# Patient Record
Sex: Male | Born: 1960
Health system: Southern US, Community
[De-identification: ages and names within clinical notes are randomized; demographics above are authoritative.]

## PROBLEM LIST (undated history)

## (undated) DIAGNOSIS — Z87442 Personal history of urinary calculi: Secondary | ICD-10-CM

## (undated) DIAGNOSIS — C801 Malignant (primary) neoplasm, unspecified: Secondary | ICD-10-CM

## (undated) DIAGNOSIS — I10 Essential (primary) hypertension: Secondary | ICD-10-CM

## (undated) DIAGNOSIS — K56699 Other intestinal obstruction unspecified as to partial versus complete obstruction: Secondary | ICD-10-CM

## (undated) HISTORY — PX: OTHER SURGICAL HISTORY: SHX169

---

## 1997-11-18 ENCOUNTER — Emergency Department (HOSPITAL_COMMUNITY): Admission: EM | Admit: 1997-11-18 | Discharge: 1997-11-18 | Payer: Self-pay | Admitting: Emergency Medicine

## 1997-11-20 ENCOUNTER — Ambulatory Visit (HOSPITAL_COMMUNITY): Admission: RE | Admit: 1997-11-20 | Discharge: 1997-11-20 | Payer: Self-pay | Admitting: Urology

## 1997-11-22 ENCOUNTER — Observation Stay (HOSPITAL_COMMUNITY): Admission: EM | Admit: 1997-11-22 | Discharge: 1997-11-23 | Payer: Self-pay | Admitting: Emergency Medicine

## 2000-05-23 HISTORY — PX: URETERAL STENT PLACEMENT: SHX822

## 2006-05-31 ENCOUNTER — Emergency Department (HOSPITAL_COMMUNITY): Admission: EM | Admit: 2006-05-31 | Discharge: 2006-05-31 | Payer: Self-pay | Admitting: Emergency Medicine

## 2007-04-02 ENCOUNTER — Emergency Department (HOSPITAL_COMMUNITY): Admission: EM | Admit: 2007-04-02 | Discharge: 2007-04-02 | Payer: Self-pay | Admitting: Emergency Medicine

## 2011-03-01 LAB — I-STAT 8, (EC8 V) (CONVERTED LAB)
Acid-base deficit: 1
BUN: 21
Bicarbonate: 24
Chloride: 110
Glucose, Bld: 110 — ABNORMAL HIGH
HCT: 44
Hemoglobin: 15
Operator id: 294501
Potassium: 4
Sodium: 141
TCO2: 25
pCO2, Ven: 40.1 — ABNORMAL LOW
pH, Ven: 7.386 — ABNORMAL HIGH

## 2011-03-01 LAB — RAPID URINE DRUG SCREEN, HOSP PERFORMED
Barbiturates: NOT DETECTED
Cocaine: NOT DETECTED
Opiates: NOT DETECTED
Tetrahydrocannabinol: NOT DETECTED

## 2011-03-01 LAB — URINALYSIS, ROUTINE W REFLEX MICROSCOPIC
Bilirubin Urine: NEGATIVE
Glucose, UA: NEGATIVE
Hgb urine dipstick: NEGATIVE
Ketones, ur: NEGATIVE
Nitrite: NEGATIVE
Protein, ur: NEGATIVE
Specific Gravity, Urine: 1.023
Urobilinogen, UA: 0.2
pH: 6

## 2011-11-30 ENCOUNTER — Emergency Department: Payer: Self-pay | Admitting: Emergency Medicine

## 2011-11-30 LAB — URINALYSIS, COMPLETE
Bacteria: NONE SEEN
Bilirubin,UR: NEGATIVE
Glucose,UR: NEGATIVE mg/dL (ref 0–75)
Leukocyte Esterase: NEGATIVE
Nitrite: NEGATIVE
Specific Gravity: 1.019 (ref 1.003–1.030)
Squamous Epithelial: NONE SEEN
WBC UR: <1 /HPF (ref 0–5)

## 2011-11-30 LAB — CBC
MCH: 28.8 pg (ref 26.0–34.0)
MCHC: 33 g/dL (ref 32.0–36.0)
Platelet: 251 10*3/uL (ref 150–440)
RDW: 13.5 % (ref 11.5–14.5)

## 2011-11-30 LAB — BASIC METABOLIC PANEL
Calcium, Total: 9.5 mg/dL (ref 8.5–10.1)
Chloride: 106 mmol/L (ref 98–107)
Co2: 29 mmol/L (ref 21–32)
Creatinine: 1.67 mg/dL — ABNORMAL HIGH (ref 0.60–1.30)
Sodium: 142 mmol/L (ref 136–145)

## 2013-02-05 ENCOUNTER — Other Ambulatory Visit: Payer: Self-pay | Admitting: Sports Medicine

## 2013-02-05 DIAGNOSIS — M25511 Pain in right shoulder: Secondary | ICD-10-CM

## 2013-02-07 ENCOUNTER — Ambulatory Visit
Admission: RE | Admit: 2013-02-07 | Discharge: 2013-02-07 | Disposition: A | Discharge status: Home / Self Care | Payer: No Typology Code available for payment source | Source: Ambulatory Visit | Attending: Sports Medicine | Admitting: Sports Medicine

## 2013-02-07 DIAGNOSIS — M25511 Pain in right shoulder: Secondary | ICD-10-CM

## 2013-06-03 ENCOUNTER — Emergency Department: Payer: Self-pay | Admitting: Emergency Medicine

## 2015-02-18 ENCOUNTER — Encounter: Payer: Self-pay | Admitting: Family Medicine

## 2015-02-18 ENCOUNTER — Ambulatory Visit (INDEPENDENT_AMBULATORY_CARE_PROVIDER_SITE_OTHER): Payer: BLUE CROSS/BLUE SHIELD | Admitting: Family Medicine

## 2015-02-18 ENCOUNTER — Encounter (INDEPENDENT_AMBULATORY_CARE_PROVIDER_SITE_OTHER): Payer: Self-pay

## 2015-02-18 VITALS — BP 140/100 | HR 80 | Temp 98.6°F | Ht 73.75 in | Wt 208.8 lb

## 2015-02-18 DIAGNOSIS — Z125 Encounter for screening for malignant neoplasm of prostate: Secondary | ICD-10-CM

## 2015-02-18 DIAGNOSIS — N400 Enlarged prostate without lower urinary tract symptoms: Secondary | ICD-10-CM | POA: Diagnosis not present

## 2015-02-18 DIAGNOSIS — Z13220 Encounter for screening for lipoid disorders: Secondary | ICD-10-CM | POA: Diagnosis not present

## 2015-02-18 DIAGNOSIS — R7989 Other specified abnormal findings of blood chemistry: Secondary | ICD-10-CM

## 2015-02-18 DIAGNOSIS — E663 Overweight: Secondary | ICD-10-CM | POA: Diagnosis not present

## 2015-02-18 DIAGNOSIS — R42 Dizziness and giddiness: Secondary | ICD-10-CM | POA: Diagnosis not present

## 2015-02-18 DIAGNOSIS — R0789 Other chest pain: Secondary | ICD-10-CM | POA: Diagnosis not present

## 2015-02-18 DIAGNOSIS — R079 Chest pain, unspecified: Secondary | ICD-10-CM | POA: Insufficient documentation

## 2015-02-18 LAB — CBC
HEMATOCRIT: 50.3 % (ref 39.0–52.0)
Hemoglobin: 17.1 g/dL — ABNORMAL HIGH (ref 13.0–17.0)
MCHC: 34 g/dL (ref 30.0–36.0)
MCV: 86.4 fl (ref 78.0–100.0)
Platelets: 294 10*3/uL (ref 150.0–400.0)
RBC: 5.82 Mil/uL — ABNORMAL HIGH (ref 4.22–5.81)
RDW: 13.7 % (ref 11.5–15.5)
WBC: 8.6 10*3/uL (ref 4.0–10.5)

## 2015-02-18 LAB — COMPREHENSIVE METABOLIC PANEL
ALK PHOS: 72 U/L (ref 39–117)
ALT: 23 U/L (ref 0–53)
AST: 15 U/L (ref 0–37)
Albumin: 4.5 g/dL (ref 3.5–5.2)
BUN: 18 mg/dL (ref 6–23)
CHLORIDE: 102 meq/L (ref 96–112)
CO2: 30 meq/L (ref 19–32)
Calcium: 10.2 mg/dL (ref 8.4–10.5)
Creatinine, Ser: 1.15 mg/dL (ref 0.40–1.50)
GFR: 70.38 mL/min (ref >60.00)
GLUCOSE: 89 mg/dL (ref 70–99)
POTASSIUM: 4.6 meq/L (ref 3.5–5.1)
SODIUM: 140 meq/L (ref 135–145)
Total Bilirubin: 0.7 mg/dL (ref 0.2–1.2)
Total Protein: 7.1 g/dL (ref 6.0–8.3)

## 2015-02-18 LAB — LIPID PANEL
CHOL/HDL RATIO: 8
Cholesterol: 253 mg/dL — ABNORMAL HIGH (ref 0–200)
HDL: 33.3 mg/dL — AB (ref >39.00)
Triglycerides: 422 mg/dL — ABNORMAL HIGH (ref 0.0–149.0)

## 2015-02-18 LAB — PSA: PSA: 2.91 ng/mL (ref 0.10–4.00)

## 2015-02-18 LAB — LDL CHOLESTEROL, DIRECT: LDL DIRECT: 157 mg/dL

## 2015-02-18 LAB — HEMOGLOBIN A1C: HEMOGLOBIN A1C: 5.6 % (ref 4.6–6.5)

## 2015-02-18 MED ORDER — TAMSULOSIN HCL 0.4 MG PO CAPS: 0.4000 mg | ORAL_CAPSULE | Freq: Every day | ORAL | Status: DC

## 2015-02-18 MED ORDER — MECLIZINE HCL 25 MG PO TABS: 25.0000 mg | ORAL_TABLET | Freq: Three times a day (TID) | ORAL | Status: DC | PRN

## 2015-02-18 MED ORDER — ASPIRIN EC 81 MG PO TBEC: 81.0000 mg | DELAYED_RELEASE_TABLET | Freq: Every day | ORAL | Status: AC

## 2015-02-18 NOTE — Assessment & Plan Note (Addendum)
Patients history suggestive of vertigo. He has had this in the past as well. Offered PT for neurovestibular rehab. He declined. Treating with PRN Meclizine.

## 2015-02-18 NOTE — Progress Notes (Signed)
Pre visit review using our clinic review tool, if applicable. No additional management support is needed unless otherwise documented below in the visit note. 

## 2015-02-18 NOTE — Patient Instructions (Signed)
Take the Flomax and Aspirin (81 mg) daily.  We will call with your cardiology appointment.  Please follow up with me after your appointment (in the next few weeks to 1 month).  We will call with your lab results.  Take care  Dr. Lacinda Axon

## 2015-02-18 NOTE — Progress Notes (Addendum)
Subjective:  Patient ID: Anthony Knight, male    DOB: 07-23-1960  Age: 54 y.o. MRN: [id number]  CC: Establish care; Chest pain, Dizziness.  HPI Anthony Knight is a 54 y.o. male presents to the clinic today to establish care.  He has a few issues/concerns to discuss today. See below.  1) Dizziness  He reports he had dizziness and nausea and vomiting approximately 3 weeks ago which resolved quickly.  Patient had an additional bout of dizziness on Saturday.  He describes the dizziness as feeling as if things are spinning. He also states he felt like he may pass out.  He reports associated diaphoresis and nausea and vomiting.  He reports that when it happened on Saturday he took his blood pressure was 175/111.  He reports some associated visual difficulty but he has not had an ophthalmologic evaluation and approximately year.  He reports that it improved slowly and spontaneously.  Dizziness was exacerbated by movement. Relieved by being still.  2) Chest pain  Patient reports that after his episode of dizziness and diaphoresis he subsequently developed midsternal chest pain.  He reports the pain is mild to moderate and constant.  He states that he continues to have pain.  He reports that the pain is worse with lying down. Relieved by standing up/leaning forward.  He denies any association with exertion.  Additionally, he states that he's had marked fatigue for approximately 1 month.  PMH, Surgical Hx, Family Hx, Social History reviewed and updated as below. Past Medical History  Diagnosis Date  . Chicken pox   . Kidney stones    Past Surgical History  Procedure Laterality Date  . Ureteral stent placement  2002    kidney stones removal as well  . Rectal tear      Per patient report   Family History  Problem Relation Age of Onset  . Cancer Father     prostate  . Cancer Maternal Grandfather     lung and colon  . Cancer Maternal Uncle     colon  . Cancer Maternal  Uncle     spleen  . Cancer Maternal Uncle    Social History  Substance Use Topics  . Smoking status: Former Smoker    Quit date: 02/17/1985  . Smokeless tobacco: Never Used  . Alcohol Use: 0.0 - 0.6 oz/week    0-1 Standard drinks or equivalent per week   Review of Systems  Constitutional: Positive for diaphoresis.  HENT: Positive for tinnitus.   Cardiovascular: Positive for chest pain and leg swelling.  Gastrointestinal: Positive for nausea and vomiting.  Genitourinary: Positive for difficulty urinating.  Musculoskeletal: Positive for myalgias.  All other systems negative.  Objective:   Today's Vitals: BP 140/100 mmHg  Pulse 80  Temp(Src) 98.6 F (37 C) (Oral)  Ht 6' 1.75" (1.873 m)  Wt 208 lb 12 oz (94.688 kg)  BMI 26.99 kg/m2  SpO2 97%  Physical Exam  Constitutional: He is oriented to person, place, and time. He appears well-developed and well-nourished. No distress.  HENT:  Head: Normocephalic and atraumatic.  Nose: Nose normal.  Mouth/Throat: Oropharynx is clear and moist. No oropharyngeal exudate.  Normal TM's bilaterally.   Eyes: Conjunctivae are normal. No scleral icterus.  Neck: Neck supple. No thyromegaly present.  Cardiovascular: Normal rate and regular rhythm.   No murmur heard. Pulmonary/Chest: Effort normal and breath sounds normal. He has no wheezes. He has no rales. He exhibits tenderness.    Abdominal: Soft. He exhibits  no distension. There is no tenderness. There is no rebound and no guarding.  Genitourinary: Prostate is enlarged.  Musculoskeletal: Normal range of motion. He exhibits no edema.  Lymphadenopathy:    He has no cervical adenopathy.  Neurological: He is alert and oriented to person, place, and time.  Skin: Skin is warm and dry. No rash noted.  Psychiatric: He has a normal mood and affect.  Vitals reviewed.  EKG: normal EKG, normal sinus rhythm, no signs of ischemia.  Assessment & Plan:   Problem List Items Addressed This Visit      Dizziness    Patients history suggestive of vertigo. He has had this in the past as well. Offered PT for neurovestibular rehab. He declined. Treating with PRN Meclizine.      Relevant Orders   CBC   Chest pain - Primary    Obtaining labs today. EKG was unremarkable. Given age and recent diaphoresis and chest pain, I recommended cardiology evaluation (and likely stress). Patient in agreement. Referring to cardiology. ASA daily.       Relevant Orders   EKG 12-Lead (Completed)   Ambulatory referral to Cardiology   BPH (benign prostatic hyperplasia)    Review of systems revealed some difficulty urinating. Patient reported that he had difficulty initiating and keeping a stream of urine. Rectal exam revealed enlargement of the prostate consistent with BPH. Treated with Flomax.      Relevant Medications   tamsulosin (FLOMAX) 0.4 MG CAPS capsule    Other Visit Diagnoses    Overweight (BMI 25.0-29.9)        Relevant Orders    Hemoglobin A1c    Comprehensive metabolic panel    Screening for lipid disorders        Relevant Orders    Lipid panel    Screening for prostate cancer        Relevant Orders    PSA       Outpatient Encounter Prescriptions as of 02/18/2015  Medication Sig  . aspirin EC 81 MG tablet Take 1 tablet (81 mg total) by mouth daily.  . meclizine (ANTIVERT) 25 MG tablet Take 1 tablet (25 mg total) by mouth 3 (three) times daily as needed for dizziness.  . tamsulosin (FLOMAX) 0.4 MG CAPS capsule Take 1 capsule (0.4 mg total) by mouth daily.   No facility-administered encounter medications on file as of 02/18/2015.    Follow-up: Following cardiology eval; within the next few weeks.   Coral Spikes DO

## 2015-02-18 NOTE — Assessment & Plan Note (Signed)
Review of systems revealed some difficulty urinating. Patient reported that he had difficulty initiating and keeping a stream of urine. Rectal exam revealed enlargement of the prostate consistent with BPH. Treated with Flomax.

## 2015-02-18 NOTE — Assessment & Plan Note (Signed)
Obtaining labs today. EKG was unremarkable. Given age and recent diaphoresis and chest pain, I recommended cardiology evaluation (and likely stress). Patient in agreement. Referring to cardiology. ASA daily.

## 2015-02-27 ENCOUNTER — Other Ambulatory Visit (INDEPENDENT_AMBULATORY_CARE_PROVIDER_SITE_OTHER): Payer: BLUE CROSS/BLUE SHIELD

## 2015-02-27 ENCOUNTER — Other Ambulatory Visit: Payer: Self-pay | Admitting: Family Medicine

## 2015-02-27 ENCOUNTER — Telehealth: Payer: Self-pay | Admitting: *Deleted

## 2015-02-27 DIAGNOSIS — E785 Hyperlipidemia, unspecified: Secondary | ICD-10-CM

## 2015-02-27 LAB — LIPID PANEL
CHOL/HDL RATIO: 8
Cholesterol: 234 mg/dL — ABNORMAL HIGH (ref 0–200)
HDL: 31 mg/dL — ABNORMAL LOW (ref >39.00)
NonHDL: 202.64
Triglycerides: 259 mg/dL — ABNORMAL HIGH (ref 0.0–149.0)
VLDL: 51.8 mg/dL — AB (ref 0.0–40.0)

## 2015-02-27 LAB — LDL CHOLESTEROL, DIRECT: LDL DIRECT: 138 mg/dL

## 2015-02-27 NOTE — Telephone Encounter (Signed)
Labs and dx?  

## 2015-03-02 ENCOUNTER — Other Ambulatory Visit: Payer: Self-pay | Admitting: Family Medicine

## 2015-03-02 MED ORDER — ATORVASTATIN CALCIUM 40 MG PO TABS: 40.0000 mg | ORAL_TABLET | Freq: Every day | ORAL | Status: DC

## 2015-03-26 ENCOUNTER — Ambulatory Visit: Payer: BLUE CROSS/BLUE SHIELD | Admitting: Internal Medicine

## 2015-07-07 ENCOUNTER — Ambulatory Visit (INDEPENDENT_AMBULATORY_CARE_PROVIDER_SITE_OTHER): Payer: BLUE CROSS/BLUE SHIELD | Admitting: Family Medicine

## 2015-07-07 ENCOUNTER — Ambulatory Visit
Admission: RE | Admit: 2015-07-07 | Discharge: 2015-07-07 | Disposition: A | Discharge status: Home / Self Care | Payer: BLUE CROSS/BLUE SHIELD | Source: Ambulatory Visit | Attending: Family Medicine | Admitting: Family Medicine

## 2015-07-07 ENCOUNTER — Encounter: Payer: Self-pay | Admitting: Family Medicine

## 2015-07-07 VITALS — BP 146/98 | HR 83 | Temp 97.8°F | Ht 73.75 in | Wt 214.5 lb

## 2015-07-07 DIAGNOSIS — R42 Dizziness and giddiness: Secondary | ICD-10-CM

## 2015-07-07 DIAGNOSIS — I10 Essential (primary) hypertension: Secondary | ICD-10-CM | POA: Diagnosis not present

## 2015-07-07 NOTE — Assessment & Plan Note (Signed)
BP elevated today. He states that he has not taken his losartan. Advised compliance with losartan.

## 2015-07-07 NOTE — Patient Instructions (Signed)
Go over to the medical mall.  We will call with the results.  Take care  Dr. Lacinda Axon

## 2015-07-07 NOTE — Progress Notes (Addendum)
Subjective:  Patient ID: Anthony Knight, male    DOB: [birth year]  Age: 55 y.o. MRN: [id number]  CC: Dizziness  HPI:  55 year old male with a past medical history of a BPH, HTN presents with dizziness/lightheadedness.  Lightheadedness  Patient reports he's had lightheadedness/dizziness for the past month.  He states that it is been worsening for the past 2-1/2 weeks.  He states that it is now occurring constantly and every day.  He describes it as feeling lightheaded as if he is going to pass out.  He states it is different from his prior history of vertigo.  He reports associated blurry vision.  No known exacerbating factors.  He reports that he does have some relief when he closes his eyes. Has taken meclizine with no relief.  Additionally, he states that he's had some ear pain.  No reports of ear fullness. He does have ringing in the ears but this is been an issue for years.  No known inciting event.  No reports of feeling as if the room is spinning/feeling off balance.  HTN  Stable on losartan.  Social Hx   Social History   Social History  . Marital Status: Married    Spouse Name: N/A  . Number of Children: N/A  . Years of Education: N/A   Social History Main Topics  . Smoking status: Former Smoker    Quit date: 02/17/1985  . Smokeless tobacco: Never Used  . Alcohol Use: 0.0 - 0.6 oz/week    0-1 Standard drinks or equivalent per week  . Drug Use: No  . Sexual Activity:    Partners: Female   Other Topics Concern  . None   Social History Narrative   Review of Systems  Constitutional: Negative.   Eyes: Positive for visual disturbance.  Respiratory: Negative.   Cardiovascular: Negative.   Neurological: Positive for dizziness and light-headedness.    Objective:  BP 146/98 mmHg  Pulse 83  Temp(Src) 97.8 F (36.6 C) (Oral)  Ht 6' 1.75" (1.873 m)  Wt 214 lb 8 oz (97.297 kg)  BMI 27.73 kg/m2  SpO2 98%  BP/Weight 07/07/2015 0000000    Systolic BP 123456 [id number]  Diastolic BP 98 123XX123  Wt. (Lbs) 214.5 208.75  BMI 27.73 26.99   Physical Exam  Constitutional: He is oriented to person, place, and time. He appears well-developed. No distress.  HENT:  Head: Normocephalic and atraumatic.  Normal TMs bilaterally.  Cardiovascular: Normal rate and regular rhythm.   No murmur heard. Pulmonary/Chest: Effort normal and breath sounds normal. No respiratory distress. He has no wheezes. He has no rales.  Neurological: He is alert and oriented to person, place, and time.  Cranial nerves grossly intact. EOMI. No nystagmus noted. Normal strength in all extremities.   Psychiatric: He has a normal mood and affect.  Vitals reviewed.  Lab Results  Component Value Date   WBC 8.6 02/18/2015   HGB 17.1* 02/18/2015   HCT 50.3 02/18/2015   PLT 294.0 02/18/2015   GLUCOSE 89 02/18/2015   CHOL 234* 02/27/2015   TRIG 259.0* 02/27/2015   HDL 31.00* 02/27/2015   LDLDIRECT 138.0 02/27/2015   ALT 23 02/18/2015   AST 15 02/18/2015   NA 140 02/18/2015   K 4.6 02/18/2015   CL 102 02/18/2015   CREATININE 1.15 02/18/2015   BUN 18 02/18/2015   CO2 30 02/18/2015   PSA 2.91 02/18/2015   HGBA1C 5.6 02/18/2015    Assessment & Plan:   Problem List Items Addressed  This Visit    Essential hypertension    BP elevated today. He states that he has not taken his losartan. Advised compliance with losartan.      Relevant Medications   losartan (COZAAR) 100 MG tablet   Lightheadedness - Primary    New problem.  Patient states that this is different from his prior episodes of dizziness/vertigo. He has had a cardiac workup which has been unremarkable. He is currently experiencing continuous lightheadedness.  Unclear etiology/diagnosis at this time. History difficult to tease out. Normal exam today.  Per AAFP algorithm, there is concern central etiology (also has risk factors).  Orthostatics negative today. Does not appear to be medication side  effect. MRI obtained today and was negative. Will send to vestibular rehabilitation.      Relevant Orders   MR Brain Wo Contrast (Completed)   Ambulatory referral to Physical Therapy     Follow-up: PRN  [place]

## 2015-07-07 NOTE — Assessment & Plan Note (Addendum)
New problem.  Patient states that this is different from his prior episodes of dizziness/vertigo. He has had a cardiac workup which has been unremarkable. He is currently experiencing continuous lightheadedness.  Unclear etiology/diagnosis at this time. History difficult to tease out. Normal exam today.  Per AAFP algorithm, there is concern central etiology (also has risk factors).  Orthostatics negative today. Does not appear to be medication side effect. MRI obtained today and was negative. This was discussed with patient. Sending to neurology for further eval.

## 2015-07-07 NOTE — Addendum Note (Signed)
Addended by: Coral Spikes on: 07/07/2015 03:06 PM   Modules accepted: Orders

## 2015-12-14 ENCOUNTER — Emergency Department
Admission: EM | Admit: 2015-12-14 | Discharge: 2015-12-15 | Disposition: A | Discharge status: Home / Self Care | Payer: BLUE CROSS/BLUE SHIELD | Attending: Emergency Medicine | Admitting: Emergency Medicine

## 2015-12-14 ENCOUNTER — Encounter: Payer: Self-pay | Admitting: Emergency Medicine

## 2015-12-14 ENCOUNTER — Emergency Department: Payer: BLUE CROSS/BLUE SHIELD

## 2015-12-14 DIAGNOSIS — R109 Unspecified abdominal pain: Secondary | ICD-10-CM

## 2015-12-14 DIAGNOSIS — I10 Essential (primary) hypertension: Secondary | ICD-10-CM | POA: Insufficient documentation

## 2015-12-14 DIAGNOSIS — Z7982 Long term (current) use of aspirin: Secondary | ICD-10-CM | POA: Diagnosis not present

## 2015-12-14 DIAGNOSIS — Z79899 Other long term (current) drug therapy: Secondary | ICD-10-CM | POA: Diagnosis not present

## 2015-12-14 DIAGNOSIS — R1031 Right lower quadrant pain: Secondary | ICD-10-CM | POA: Diagnosis not present

## 2015-12-14 DIAGNOSIS — Z87891 Personal history of nicotine dependence: Secondary | ICD-10-CM | POA: Diagnosis not present

## 2015-12-14 DIAGNOSIS — N400 Enlarged prostate without lower urinary tract symptoms: Secondary | ICD-10-CM | POA: Diagnosis not present

## 2015-12-14 DIAGNOSIS — K5732 Diverticulitis of large intestine without perforation or abscess without bleeding: Secondary | ICD-10-CM

## 2015-12-14 DIAGNOSIS — R300 Dysuria: Secondary | ICD-10-CM | POA: Diagnosis not present

## 2015-12-14 DIAGNOSIS — R1030 Lower abdominal pain, unspecified: Secondary | ICD-10-CM | POA: Diagnosis present

## 2015-12-14 LAB — URINALYSIS COMPLETE WITH MICROSCOPIC (ARMC ONLY)
BACTERIA UA: NONE SEEN
BILIRUBIN URINE: NEGATIVE
Glucose, UA: NEGATIVE mg/dL
Ketones, ur: NEGATIVE mg/dL
Nitrite: NEGATIVE
PH: 5 (ref 5.0–8.0)
PROTEIN: NEGATIVE mg/dL
Specific Gravity, Urine: 1.023 (ref 1.005–1.030)

## 2015-12-14 LAB — CBC
HCT: 45 % (ref 40.0–52.0)
HEMOGLOBIN: 14.9 g/dL (ref 13.0–18.0)
MCH: 28.9 pg (ref 26.0–34.0)
MCHC: 33.1 g/dL (ref 32.0–36.0)
MCV: 87.3 fL (ref 80.0–100.0)
Platelets: 301 10*3/uL (ref 150–440)
RBC: 5.15 MIL/uL (ref 4.40–5.90)
RDW: 13.8 % (ref 11.5–14.5)
WBC: 17.6 10*3/uL — AB (ref 3.8–10.6)

## 2015-12-14 LAB — COMPREHENSIVE METABOLIC PANEL
ALBUMIN: 3.9 g/dL (ref 3.5–5.0)
ALK PHOS: 108 U/L (ref 38–126)
ALT: 28 U/L (ref 17–63)
ANION GAP: 9 (ref 5–15)
AST: 16 U/L (ref 15–41)
BILIRUBIN TOTAL: 1.1 mg/dL (ref 0.3–1.2)
BUN: 17 mg/dL (ref 6–20)
CALCIUM: 9.6 mg/dL (ref 8.9–10.3)
CO2: 24 mmol/L (ref 22–32)
Chloride: 102 mmol/L (ref 101–111)
Creatinine, Ser: 1.33 mg/dL — ABNORMAL HIGH (ref 0.61–1.24)
GFR, EST NON AFRICAN AMERICAN: 59 mL/min — AB (ref >60)
GLUCOSE: 112 mg/dL — AB (ref 65–99)
POTASSIUM: 4.3 mmol/L (ref 3.5–5.1)
Sodium: 135 mmol/L (ref 135–145)
TOTAL PROTEIN: 7.7 g/dL (ref 6.5–8.1)

## 2015-12-14 LAB — LIPASE, BLOOD: Lipase: 24 U/L (ref 11–51)

## 2015-12-14 MED ORDER — SODIUM CHLORIDE 0.9 % IV BOLUS (SEPSIS)
1000.0000 mL | Freq: Once | INTRAVENOUS | Status: AC
  Administered 2015-12-14: 1000 mL via INTRAVENOUS

## 2015-12-14 MED ORDER — HYDROMORPHONE HCL 1 MG/ML IJ SOLN
0.5000 mg | Freq: Once | INTRAMUSCULAR | Status: AC
  Administered 2015-12-14: 0.5 mg via INTRAVENOUS
  Filled 2015-12-14: qty 1

## 2015-12-14 MED ORDER — KETOROLAC TROMETHAMINE 30 MG/ML IJ SOLN
30.0000 mg | Freq: Once | INTRAMUSCULAR | Status: AC
  Administered 2015-12-14: 30 mg via INTRAVENOUS
  Filled 2015-12-14: qty 1

## 2015-12-14 MED ORDER — ONDANSETRON HCL 4 MG/2ML IJ SOLN
4.0000 mg | Freq: Once | INTRAMUSCULAR | Status: AC
  Administered 2015-12-14: 4 mg via INTRAVENOUS
  Filled 2015-12-14: qty 2

## 2015-12-14 NOTE — ED Provider Notes (Signed)
[facility] Emergency Department Provider Note  ____________________________________________  Time seen: Approximately 7:16 PM  I have reviewed the triage vital signs and the nursing notes.   HISTORY  Chief Complaint Abdominal Pain    HPI Anthony Knight is a 55 y.o. male with a history of BPH and renal colic requiring ureteral stents in the past presenting with suprapubic and scrotal pain. The patient reports that for the past 10 days he has had a progressively worsening suprapubic discomfort with radiating pain into the bilateral testicles. He has associated dysuria but no hematuria. This morning had a fever to 101.1. Yesterday he had multiple episodes of nausea and vomiting. No diarrhea.   Past Medical History:  Diagnosis Date  . Chicken pox   . Kidney stones     Patient Active Problem List   Diagnosis Date Noted  . Essential hypertension 07/07/2015  . Lightheadedness 07/07/2015  . BPH (benign prostatic hyperplasia) 02/18/2015    Past Surgical History:  Procedure Laterality Date  . rectal tear     Per patient report  . URETERAL STENT PLACEMENT  2002   kidney stones removal as well    Current Outpatient Rx  . Order #: [id number] Class: Normal  . Order #: [id number] Class: Normal  . Order #: [id number] Class: Historical Med  . Order #: [id number] Class: Normal  . Order #: [id number] Class: Normal    Allergies Review of patient's allergies indicates no known allergies.  Family History  Problem Relation Age of Onset  . Cancer Father     prostate  . Cancer Maternal Grandfather     lung and colon  . Cancer Maternal Uncle     colon  . Cancer Maternal Uncle     spleen  . Cancer Maternal Uncle     Social History Social History  Substance Use Topics  . Smoking status: Former Smoker    Quit date: 02/17/1985  . Smokeless tobacco: Never Used  . Alcohol use 0.0 - 0.6 oz/week    Review of Systems Constitutional: No fever/chills.No lightheadedness or  syncope. Eyes: No visual changes. ENT: No sore throat. No congestion or rhinorrhea. Cardiovascular: Denies chest pain. Denies palpitations. Respiratory: Denies shortness of breath.  No cough. Gastrointestinal: Positive suprapubic abdominal pain.  Positive nausea, positive vomiting.  No diarrhea.  No constipation. No flank pain Genitourinary: Positive for dysuria. No hematuria. Musculoskeletal: Negative for back pain. Skin: Negative for rash. Neurological: Negative for headaches. No focal numbness, tingling or weakness.   10-point ROS otherwise negative.  ____________________________________________   PHYSICAL EXAM:  VITAL SIGNS: ED Triage Vitals  Enc Vitals Group     BP 12/14/15 1719 130/71     Pulse Rate 12/14/15 1719 91     Resp 12/14/15 1719 16     Temp 12/14/15 1719 99.1 F (37.3 C)     Temp Source 12/14/15 1719 Oral     SpO2 12/14/15 1719 100 %     Weight 12/14/15 1719 198 lb (89.8 kg)     Height 12/14/15 1719 6' (1.829 m)     Head Circumference --      Peak Flow --      Pain Score 12/14/15 1718 9     Pain Loc --      Pain Edu? --      Excl. in [place]? --     Constitutional: Alert and oriented. Well appearing and in no acute distress. Answers questions appropriately. Eyes: Conjunctivae are normal.  EOMI. No scleral icterus. Head: Atraumatic. Nose: No  congestion/rhinnorhea. Mouth/Throat: Mucous membranes are moist.  Neck: No stridor.  Supple.   Cardiovascular: Normal rate, regular rhythm. No murmurs, rubs or gallops.  Respiratory: Normal respiratory effort.  No accessory muscle use or retractions. Lungs CTAB.  No wheezes, rales or ronchi. Gastrointestinal: Soft and nondistended.  Mild tenderness to palpation in the suprapubic area. No guarding or rebound.  No peritoneal signs. No CVA tenderness. Musculoskeletal: No LE edema.  Neurologic:  A&Ox3.  Speech is clear.  Face and smile are symmetric.  EOMI.  Moves all extremities well. Skin:  Skin is warm, dry and intact. No  rash noted. Psychiatric: Mood and affect are normal. Speech and behavior are normal.  Normal judgement.  ____________________________________________   LABS (all labs ordered are listed, but only abnormal results are displayed)  Labs Reviewed  COMPREHENSIVE METABOLIC PANEL - Abnormal; Notable for the following:       Result Value   Glucose, Bld 112 (*)    Creatinine, Ser 1.33 (*)    GFR calc non Af Amer 59 (*)    All other components within normal limits  CBC - Abnormal; Notable for the following:    WBC 17.6 (*)    All other components within normal limits  URINALYSIS COMPLETEWITH MICROSCOPIC ([facility] ONLY) - Abnormal; Notable for the following:    Color, Urine YELLOW (*)    APPearance CLEAR (*)    Hgb urine dipstick 1+ (*)    Leukocytes, UA TRACE (*)    Squamous Epithelial / LPF 0-5 (*)    All other components within normal limits  LIPASE, BLOOD   ____________________________________________  EKG  Not indicated ____________________________________________  RADIOLOGY  No results found.  ____________________________________________   PROCEDURES  Procedure(s) performed: None  Procedures  Critical Care performed: No ____________________________________________   INITIAL IMPRESSION / ASSESSMENT AND PLAN / ED COURSE  Pertinent labs & imaging results that were available during my care of the patient were reviewed by me and considered in my medical decision making (see chart for details).  55 y.o. male with a history of renal colic presenting with 10 days of progressively worsening suprapubic pain, dysuria, and scrotal pain. It is possible that the patient has renal colic, and I would consider other diagnoses including diverticulitis, UTI. Will get a CT with renal stone protocol, and basic labs. Will initiate some dramatic treatment immediately.  ----------------------------------------- 7:21 PM on 12/14/2015 -----------------------------------------  The patient  has no evidence of blood or infection and his urine. He does have a elevated white blood cell count at 17.5, and a mild bump in his creatinine compared to baseline.  ----------------------------------------- 12:14 AM on 12/15/2015 -----------------------------------------  The patient's pain has been significantly improved since his arrival. I'm still awaiting the results of his CT scan at this time. The patient is been signed out to Dr. Joni Fears who will follow up with his imaging results for final disposition. ____________________________________________  FINAL CLINICAL IMPRESSION(S) / ED DIAGNOSES  Final diagnoses:  None    Clinical Course  Value Comment By Time   The patient reports that his pain had improved with the Toradol, but going to CT his pain has now gotten slightly worse. At this point, I'm waiting the results of his radiology imaging for final disposition. Eula Listen, MD 07/24 2205  Glucose: (!) 112 (Reviewed) Carrie Mew, MD 07/24 2358      NEW MEDICATIONS STARTED DURING THIS VISIT:  New Prescriptions   No medications on file      Eula Listen, MD 12/15/15  0015  

## 2015-12-14 NOTE — ED Notes (Signed)
Pt. Wife asked for time frame for CT. This RN spoke with CT who reported complications with the mobile unit and image processing and not able to provide time estimate. Family notified.

## 2015-12-14 NOTE — ED Notes (Signed)
Patient transported to CT 

## 2015-12-14 NOTE — ED Notes (Signed)
Wife remaining in tx. Room while pt. Transported to CT.

## 2015-12-14 NOTE — ED Triage Notes (Signed)
C/O suprapubic pain x 10 days.  Initially pain was to lower right abdomen but pain now suprapubic.  Patient also describes flu like symptoms, fevers, and leg pain.

## 2015-12-14 NOTE — ED Notes (Signed)
patient skin warm and dry.  States pain worsens with urination.  Posture relaxed and upright.

## 2015-12-15 DIAGNOSIS — N400 Enlarged prostate without lower urinary tract symptoms: Secondary | ICD-10-CM | POA: Diagnosis not present

## 2015-12-15 MED ORDER — METRONIDAZOLE 500 MG PO TABS: 500.0000 mg | ORAL_TABLET | Freq: Three times a day (TID) | ORAL | 0 refills | Status: DC

## 2015-12-15 MED ORDER — CIPROFLOXACIN HCL 500 MG PO TABS: 500.0000 mg | ORAL_TABLET | Freq: Two times a day (BID) | ORAL | 0 refills | Status: DC

## 2015-12-15 MED ORDER — OXYCODONE-ACETAMINOPHEN 5-325 MG PO TABS: 1.0000 | ORAL_TABLET | Freq: Four times a day (QID) | ORAL | 0 refills | Status: DC | PRN

## 2015-12-15 MED ORDER — NAPROXEN 500 MG PO TABS: 500.0000 mg | ORAL_TABLET | Freq: Two times a day (BID) | ORAL | 0 refills | Status: DC

## 2015-12-15 MED ORDER — ONDANSETRON 4 MG PO TBDP: 4.0000 mg | ORAL_TABLET | Freq: Three times a day (TID) | ORAL | 0 refills | Status: DC | PRN

## 2015-12-15 NOTE — ED Notes (Signed)
Pt. Requesting drink at this time. RN explained the process is usually to wait until results from imaging are back before NPO lifted, but the situation this shift is unordinary and that RN would check with MD. EDP Joni Fears approved and pt. Received cranberry juice.

## 2015-12-15 NOTE — ED Provider Notes (Signed)
CT reveals uncomplicated diverticulitis. No perforation or abscess formation. Patient well-appearing, vital signs unremarkable. We'll discharge home. Cipro Flagyl especially due to white blood cell count. Percocet NSAIDs and Zofran. Follow up with primary care.   Carrie Mew, MD 12/15/15 [phone]

## 2015-12-15 NOTE — Discharge Instructions (Signed)
You were prescribed a medication that is potentially sedating. Do not drink alcohol, drive or participate in any other potentially dangerous activities while taking this medication as it may make you sleepy. Do not take this medication with any other sedating medications, either prescription or over-the-counter. If you were prescribed Percocet or Vicodin, do not take these with acetaminophen (Tylenol) as it is already contained within these medications.   Opioid pain medications (or "narcotics") can be habit forming.  Use it as little as possible to achieve adequate pain control.  Do not use or use it with extreme caution if you have a history of opiate abuse or dependence.  If you are on a pain contract with your primary care doctor or a pain specialist, be sure to let them know you were prescribed this medication today from the [facility] Emergency Department.  This medication is intended for your use only - do not give any to anyone else and keep it in a secure place where nobody else, especially children and pets, have access to it.  It will also cause or worsen constipation, so you may want to consider taking an over-the-counter stool softener while you are taking this medication.

## 2015-12-15 NOTE — ED Notes (Signed)
Wife to desk to inquire about tx plan at this time and is upset that pt. Was not transported to another hospital due to slow CT process. MD notified and due to review scans and update pt in tx. Room.

## 2015-12-15 NOTE — ED Notes (Signed)
Pt. Verbalizes understanding of d/c instructions, prescriptions, and follow-up. VS stable.  Pt. In NAD at time of d/c and denies concerns. Pt. Ambulatory Out of the unit with steady gait.   

## 2016-04-08 DIAGNOSIS — I10 Essential (primary) hypertension: Secondary | ICD-10-CM | POA: Diagnosis not present

## 2017-01-25 ENCOUNTER — Encounter: Payer: Self-pay | Admitting: Family Medicine

## 2017-01-25 ENCOUNTER — Ambulatory Visit (INDEPENDENT_AMBULATORY_CARE_PROVIDER_SITE_OTHER): Payer: BLUE CROSS/BLUE SHIELD | Admitting: Family Medicine

## 2017-01-25 DIAGNOSIS — R51 Headache: Secondary | ICD-10-CM

## 2017-01-25 DIAGNOSIS — R519 Headache, unspecified: Secondary | ICD-10-CM

## 2017-01-25 NOTE — Patient Instructions (Signed)
We will call with the referral.  I will try and expedite.  Take care  Dr. Lacinda Axon

## 2017-01-26 DIAGNOSIS — R51 Headache: Secondary | ICD-10-CM

## 2017-01-26 DIAGNOSIS — R519 Headache, unspecified: Secondary | ICD-10-CM | POA: Insufficient documentation

## 2017-01-26 NOTE — Assessment & Plan Note (Signed)
New problem. Patient has had an MRI earlier this year which was normal. He reports associated lightheadedness/presyncope, tinnitus, ear pain. His exam is unremarkable except for swaying with Romberg. Some of his symptoms fit the diagnosis of Mnire's doesn't explain all of his symptoms. I am unsure the etiology and prognosis of this time. I'm referring him to neurology.

## 2017-01-26 NOTE — Progress Notes (Signed)
Subjective:  Patient ID: Anthony Knight, male    DOB: Aug 04, 1960  Age: 56 y.o. MRN: [id number]  CC: Headache, tinnitus, Ear pain, Lightheadedness  HPI:  56 year old male with hypertension presents with the above complaints.  Patient states that he has not felt well for the past 5 weeks. He states that he's had a bitemporal headache. Mild to moderate in severity. Described as a dull ache. He reports that in addition to headache he's had severe ringing in the ears and associated ear discomfort. He's also had difficulty concentrating/focusing and feelings of lightheadedness or presyncope. He states that approximately 2 weeks ago he was hit in the back of the head/neck while at work. However, he states that his symptoms had began before that. He states that the only thing he can compare his symptoms to our prior episodes of concussions. He has slight improvement in his symptoms when he closes his eyes. No other associated symptoms. No other complaints or concerns at this time.  Social Hx   Social History   Social History  . Marital status: Married    Spouse name: N/A  . Number of children: N/A  . Years of education: N/A   Social History Main Topics  . Smoking status: Former Smoker    Quit date: 02/17/1985  . Smokeless tobacco: Never Used  . Alcohol use 0.0 - 0.6 oz/week  . Drug use: No  . Sexual activity: Yes    Partners: Female   Other Topics Concern  . None   Social History Narrative  . None    Review of Systems  Constitutional: Negative.   HENT: Positive for ear pain and tinnitus.   Neurological: Positive for light-headedness and headaches.  Psychiatric/Behavioral: Positive for decreased concentration.   Objective:  BP 132/88 (BP Location: Left Arm, Patient Position: Sitting, Cuff Size: Normal)   Pulse 80   Temp 98.7 F (37.1 C) (Oral)   Resp 16   Wt 216 lb 6 oz (98.1 kg)   SpO2 97%   BMI 29.35 kg/m   BP/Weight 01/25/2017 12/15/2015 5/40/0867  Systolic BP 619 509 -    Diastolic BP 88 68 -  Wt. (Lbs) 216.38 - 198  BMI 29.35 - 26.85    Physical Exam  Constitutional: He is oriented to person, place, and time. He appears well-developed. No distress.  Cardiovascular: Normal rate and regular rhythm.   Pulmonary/Chest: Effort normal. He has no wheezes. He has no rales.  Abdominal: Soft. He exhibits no distension. There is no tenderness.  Neurological: He is alert and oriented to person, place, and time.  No cranial nerve deficit. Muscle strength normal throughout. Positive Romberg.   Psychiatric: He has a normal mood and affect.  Vitals reviewed.   Lab Results  Component Value Date   WBC 17.6 (H) 12/14/2015   HGB 14.9 12/14/2015   HCT 45.0 12/14/2015   PLT 301 12/14/2015   GLUCOSE 112 (H) 12/14/2015   CHOL 234 (H) 02/27/2015   TRIG 259.0 (H) 02/27/2015   HDL 31.00 (L) 02/27/2015   LDLDIRECT 138.0 02/27/2015   ALT 28 12/14/2015   AST 16 12/14/2015   NA 135 12/14/2015   K 4.3 12/14/2015   CL 102 12/14/2015   CREATININE 1.33 (H) 12/14/2015   BUN 17 12/14/2015   CO2 24 12/14/2015   PSA 2.91 02/18/2015   HGBA1C 5.6 02/18/2015    Assessment & Plan:   Problem List Items Addressed This Visit    Headache    New problem. Patient  has had an MRI earlier this year which was normal. He reports associated lightheadedness/presyncope, tinnitus, ear pain. His exam is unremarkable except for swaying with Romberg. Some of his symptoms fit the diagnosis of Mnire's doesn't explain all of his symptoms. I am unsure the etiology and prognosis of this time. I'm referring him to neurology.      Relevant Orders   Ambulatory referral to Neurology      Follow-up: PRN  [place]

## 2017-01-30 ENCOUNTER — Encounter: Payer: Self-pay | Admitting: Neurology

## 2017-01-30 ENCOUNTER — Ambulatory Visit (INDEPENDENT_AMBULATORY_CARE_PROVIDER_SITE_OTHER): Payer: BLUE CROSS/BLUE SHIELD | Admitting: Neurology

## 2017-01-30 VITALS — BP 152/99 | HR 88 | Ht 72.0 in | Wt 220.0 lb

## 2017-01-30 DIAGNOSIS — R400 Somnolence: Secondary | ICD-10-CM | POA: Diagnosis not present

## 2017-01-30 DIAGNOSIS — R0683 Snoring: Secondary | ICD-10-CM

## 2017-01-30 DIAGNOSIS — R51 Headache with orthostatic component, not elsewhere classified: Secondary | ICD-10-CM

## 2017-01-30 DIAGNOSIS — R5383 Other fatigue: Secondary | ICD-10-CM

## 2017-01-30 DIAGNOSIS — M316 Other giant cell arteritis: Secondary | ICD-10-CM

## 2017-01-30 DIAGNOSIS — R519 Headache, unspecified: Secondary | ICD-10-CM

## 2017-01-30 MED ORDER — PREDNISONE 20 MG PO TABS: 60.0000 mg | ORAL_TABLET | Freq: Every day | ORAL | 1 refills | Status: DC

## 2017-01-30 NOTE — Progress Notes (Signed)
[facility]    Provider:  Dr Jaynee Eagles Referring Provider: Coral Spikes, DO Primary Care Physician:  Coral Spikes, DO  CC:  Headache, tinnitus,   HPI:  Anthony Knight is a 56 y.o. male here as a referral from Dr. Lacinda Axon for headaches. This is a new headache, started 5-6 weeks ago. It started with decreased cincentration, feels like he is going to pass out, headache started a few weeks later it is in the temples, alleve, nothing OTC helps. His eyes hurt, his ears are ringing. He gets dizzy, he feels lightheaded. Never had this before and denies headaches in the past. He feels fatigued. He also hit his head in the last month which has made things worse, a work table hit him on the back of the head and neck and shoulders. Since then it is even worse, headache worsened. All these symptoms started before this incident. He feels pressure in his head, vision is impaired and blurry especially when writing or reading. Symptoms worse with heat. He wakes with the symptoms, continuous. He snoring pretty bad all night, sleeping difficulty, waking frequently, very fatigued during the day. He could easily nod off anytime of the day. No light or sound sensitivity but closing his eyes helps. No nausea. Symptoms were acute, happened overnight. He has a buzzing in the ears. No other focal neurologic deficits, associated symptoms, inciting events or modifiable factors.he has neck pain. No weakness or sensory changes.   Reviewed notes, labs and imaging from outside physicians, which showed:  Personally reviewed MRI of the brain 2017 images which were normal. Personally reviewed report CT of the head 2008 which was normal.  CBC July 2017 showed elevated white blood cells, CMP July 2017 showed elevated creatinine 1.33 and decreased GFR otherwise unremarkable.  Review of Systems: Patient complains of symptoms per HPI as well as the following symptoms: headache, dizziness. Pertinent negatives and positives  per HPI. All others negative.   Social History   Social History  . Marital status: Married    Spouse name: N/A  . Number of children: N/A  . Years of education: N/A   Occupational History  . Not on file.   Social History Main Topics  . Smoking status: Former Smoker    Quit date: 02/17/1985  . Smokeless tobacco: Never Used  . Alcohol use 0.0 - 0.6 oz/week  . Drug use: No  . Sexual activity: Yes    Partners: Female   Other Topics Concern  . Not on file   Social History Narrative  . No narrative on file    Family History  Problem Relation Age of Onset  . Cancer Father        prostate  . Cancer Maternal Grandfather        lung and colon  . Cancer Maternal Uncle        colon  . Cancer Maternal Uncle        spleen  . Cancer Maternal Uncle     Past Medical History:  Diagnosis Date  . Chicken pox   . Kidney stones     Past Surgical History:  Procedure Laterality Date  . rectal tear     Per patient report  . URETERAL STENT PLACEMENT  2002   kidney stones removal as well    Current Outpatient Prescriptions  Medication Sig Dispense Refill  . aspirin EC 81 MG tablet Take 1 tablet (81 mg total) by mouth daily. 90 tablet 3  . losartan (  COZAAR) 100 MG tablet Take 100 mg by mouth daily.     . predniSONE (DELTASONE) 20 MG tablet Take 3 tablets (60 mg total) by mouth daily with breakfast. 21 tablet 1   No current facility-administered medications for this visit.     Allergies as of 01/30/2017  . (No Known Allergies)    Vitals: BP (!) 152/99   Pulse 88   Ht 6' (1.829 m)   Wt 220 lb (99.8 kg)   BMI 29.84 kg/m  Last Weight:  Wt Readings from Last 1 Encounters:  01/30/17 220 lb (99.8 kg)   Last Height:   Ht Readings from Last 1 Encounters:  01/30/17 6' (1.829 m)    Physical exam: Exam: Gen: NAD, conversant, well nourised, overweight, well groomed                     CV: RRR, no MRG. No Carotid Bruits. No peripheral edema, warm, nontender Eyes:  Conjunctivae clear without exudates or hemorrhage  Neuro: Detailed Neurologic Exam  Speech:    Speech is normal; fluent and spontaneous with normal comprehension.  Cognition:    The patient is oriented to person, place, and time;     recent and remote memory intact;     language fluent;     normal attention, concentration,     fund of knowledge Cranial Nerves:    The pupils are equal, round, and reactive to light. The fundi are normal and spontaneous venous pulsations are present. Visual fields are full to finger confrontation. Extraocular movements are intact. Trigeminal sensation is intact and the muscles of mastication are normal. The face is symmetric. The palate elevates in the midline. Hearing intact. Voice is normal. Shoulder shrug is normal. The tongue has normal motion without fasciculations.   Coordination:    Normal finger to nose and heel to shin. Normal rapid alternating movements.   Gait:    Heel-toe and tandem gait are normal.   Motor Observation:    No asymmetry, no atrophy, and no involuntary movements noted. Tone:    Normal muscle tone.    Posture:    Posture is normal. normal erect    Strength:    Strength is V/V in the upper and lower limbs.      Sensation: intact to LT     Reflex Exam:  DTR's:    Deep tendon reflexes in the upper and lower extremities are normal bilaterally.   Toes:    The toes are downgoing bilaterally.   Clonus:    Clonus is absent.      Assessment/Plan:  56 year old here with new onset headache after the age of 53 with hearing and vision changes.   MRI brain w/wo contrast for new onset headache after 50 including positional nature to eval for space occupying lesion Esr/crp for temporal arteritis evaluation Recommend sleep evaluation for snoring, daytime somnolence, headaches ESS 15 Start steroids 17m x one week F/u 4 weeks  Discussed: To prevent or relieve headaches, try the following: Cool Compress. Lie down and place  a cool compress on your head.  Avoid headache triggers. If certain foods or odors seem to have triggered your migraines in the past, avoid them. A headache diary might help you identify triggers.  Include physical activity in your daily routine. Try a daily walk or other moderate aerobic exercise.  Manage stress. Find healthy ways to cope with the stressors, such as delegating tasks on your to-do list.  Practice relaxation techniques.  Try deep breathing, yoga, massage and visualization.  Eat regularly. Eating regularly scheduled meals and maintaining a healthy diet might help prevent headaches. Also, drink plenty of fluids.  Follow a regular sleep schedule. Sleep deprivation might contribute to headaches Consider biofeedback. With this mind-body technique, you learn to control certain bodily functions - such as muscle tension, heart rate and blood pressure - to prevent headaches or reduce headache pain.    Proceed to emergency room if you experience new or worsening symptoms or symptoms do not resolve, if you have new neurologic symptoms or if headache is severe, or for any concerning symptom.   Provided education and documentation from [employer] headache [employer] toolbox including articles on: chronic migraine medication overuse headache, chronic migraines, prevention of migraines, behavioral and other nonpharmacologic treatments for headache.  Orders Placed This Encounter  Procedures  . MR BRAIN W WO CONTRAST  . CBC  . Comprehensive metabolic panel  . Sedimentation rate  . C-reactive protein  . Ambulatory referral to Sleep Studies      Sarina Ill, MD  [facility] [address] [place], [place] [address]  Phone [phone] Fax [phone]

## 2017-01-30 NOTE — Patient Instructions (Addendum)
Temporal Arteritis Temporal arteritis, also called giant cell arteritis, is a condition that causes arteries to become swollen (inflamed). It usually affects arteries in your head and face, but arteries in any part of the body can become inflamed. Temporal arteritis can cause serious problems, such as bone loss, diabetes, and blindness. What are the causes? The cause is unknown. What increases the risk?  Being older than 29.  Being a woman.  Being Caucasian.  Being of [place], [place], [place], [place] or [place] ancestry.  Having polymyalgia rheumatica (PMR). What are the signs or symptoms? Some people with temporal arteritis have just one symptom, while other have several symptoms. Most signs and symptoms are related to the head and face. Signs and symptoms may include:  Hard or swollen temples (common). Your temples are the flattened area on either side of your forehead. If your temples are swollen, it may hurt to touch them.  Pain when combing your hair or when laying your head down.  Pain in the jaw when chewing.  Pain in the throat or tongue.  Problems with your vision, such as sudden loss of vision in one eye, or seeing double.  Fever.  Fatigue.  A dry cough.  Pain in the hips and shoulders.  Pain in the arms during exercise.  Depression.  Weight loss.  How is this diagnosed? Your health care provider will ask about your symptoms and do a physical exam. He or she may also perform an eye exam and tests, such as:  A complete blood count.  An erythrocyte sedimentation rate test, also called the sed rate test.  A C-reactive protein (CRP) test.  A tissue sample (biopsy) test.  How is this treated? Temporal arteritis is treated with a type of medicine called a corticosteroid. Vision problems may be treated with additional medicines. You will need to see your health care provider while you are being treated. During follow-up visits, your health care provider  will check for problems by:  Performing blood tests and bone density tests.  Checking your blood pressure and blood sugar.  Follow these instructions at home:  Take medicines only as directed by your health care provider.  Take any vitamins or supplements that your health care provider suggests. These may include vitamin D and calcium, which help keep your bones from becoming weak.  Exercise. Talk with your health care provider about what exercises are okay for you to do. Usually exercises that increase your heart rate (aerobic exercise), such as walking, are recommended. Aerobic exercise helps control your blood pressure and prevent bone loss.  Follow a healthy diet. Include healthy sources of protein, fruits, vegetables, and whole grains in your diet. Following a healthy diet helps prevent bone damage and diabetes. Contact a health care provider if:  Your symptoms get worse.  Your fever, fatigue, headache, weight loss, or pain in your jaw gets worse.  You develop signs of infection, such as fever, swelling, redness, warmth, and tenderness. Get help right away if:  Your vision gets worse.  Your pain does not go away, even after you take pain medicine.  You have chest pain.  You have trouble breathing.  One side of your face or body suddenly becomes weak or numb. This information is not intended to replace advice given to you by your health care provider. Make sure you discuss any questions you have with your health care provider. Document Released: 03/06/2009 Document Revised: 01/07/2016 Document Reviewed: 07/03/2013 Elsevier Interactive Patient Education  [employer].  Sleep Apnea Sleep apnea is a condition in which breathing pauses or becomes shallow during sleep. Episodes of sleep apnea usually last 10 seconds or longer, and they may occur as many as 20 times an hour. Sleep apnea disrupts your sleep and keeps your body from getting the rest that it needs. This  condition can increase your risk of certain health problems, including:  Heart attack.  Stroke.  Obesity.  Diabetes.  Heart failure.  Irregular heartbeat.  There are three kinds of sleep apnea:  Obstructive sleep apnea. This kind is caused by a blocked or collapsed airway.  Central sleep apnea. This kind happens when the part of the brain that controls breathing does not send the correct signals to the muscles that control breathing.  Mixed sleep apnea. This is a combination of obstructive and central sleep apnea.  What are the causes? The most common cause of this condition is a collapsed or blocked airway. An airway can collapse or become blocked if:  Your throat muscles are abnormally relaxed.  Your tongue and tonsils are larger than normal.  You are overweight.  Your airway is smaller than normal.  What increases the risk? This condition is more likely to develop in people who:  Are overweight.  Smoke.  Have a smaller than normal airway.  Are elderly.  Are male.  Drink alcohol.  Take sedatives or tranquilizers.  Have a family history of sleep apnea.  What are the signs or symptoms? Symptoms of this condition include:  Trouble staying asleep.  Daytime sleepiness and tiredness.  Irritability.  Loud snoring.  Morning headaches.  Trouble concentrating.  Forgetfulness.  Decreased interest in sex.  Unexplained sleepiness.  Mood swings.  Personality changes.  Feelings of depression.  Waking up often during the night to urinate.  Dry mouth.  Sore throat.  How is this diagnosed? This condition may be diagnosed with:  A medical history.  A physical exam.  A series of tests that are done while you are sleeping (sleep study). These tests are usually done in a sleep lab, but they may also be done at home.  How is this treated? Treatment for this condition aims to restore normal breathing and to ease symptoms during sleep. It may  involve managing health issues that can affect breathing, such as high blood pressure or obesity. Treatment may include:  Sleeping on your side.  Using a decongestant if you have nasal congestion.  Avoiding the use of depressants, including alcohol, sedatives, and narcotics.  Losing weight if you are overweight.  Making changes to your diet.  Quitting smoking.  Using a device to open your airway while you sleep, such as: ? An oral appliance. This is a custom-made mouthpiece that shifts your lower jaw forward. ? A continuous positive airway pressure (CPAP) device. This device delivers oxygen to your airway through a mask. ? A nasal expiratory positive airway pressure (EPAP) device. This device has valves that you put into each nostril. ? A bi-level positive airway pressure (BPAP) device. This device delivers oxygen to your airway through a mask.  Surgery if other treatments do not work. During surgery, excess tissue is removed to create a wider airway.  It is important to get treatment for sleep apnea. Without treatment, this condition can lead to:  High blood pressure.  Coronary artery disease.  (Men) An inability to achieve or maintain an erection (impotence).  Reduced thinking abilities.  Follow these instructions at home:  Make any lifestyle changes that your  health care provider recommends.  Eat a healthy, well-balanced diet.  Take over-the-counter and prescription medicines only as told by your health care provider.  Avoid using depressants, including alcohol, sedatives, and narcotics.  Take steps to lose weight if you are overweight.  If you were given a device to open your airway while you sleep, use it only as told by your health care provider.  Do not use any tobacco products, such as cigarettes, chewing tobacco, and e-cigarettes. If you need help quitting, ask your health care provider.  Keep all follow-up visits as told by your health care provider. This is  important. Contact a health care provider if:  The device that you received to open your airway during sleep is uncomfortable or does not seem to be working.  Your symptoms do not improve.  Your symptoms get worse. Get help right away if:  You develop chest pain.  You develop shortness of breath.  You develop discomfort in your back, arms, or stomach.  You have trouble speaking.  You have weakness on one side of your body.  You have drooping in your face. These symptoms may represent a serious problem that is an emergency. Do not wait to see if the symptoms will go away. Get medical help right away. Call your local emergency services (911 in the [place] Do not drive yourself to the hospital. This information is not intended to replace advice given to you by your health care provider. Make sure you discuss any questions you have with your health care provider. Document Released: 04/29/2002 Document Revised: 01/03/2016 Document Reviewed: 02/16/2015 Elsevier Interactive Patient Education  2018 [employer].    Prednisone tablets What is this medicine? PREDNISONE (PRED ni sone) is a corticosteroid. It is commonly used to treat inflammation of the skin, joints, lungs, and other organs. Common conditions treated include asthma, allergies, and arthritis. It is also used for other conditions, such as blood disorders and diseases of the adrenal glands. This medicine may be used for other purposes; ask your health care provider or pharmacist if you have questions. COMMON BRAND NAME(S): Deltasone, Predone, Sterapred, Sterapred DS What should I tell my health care provider before I take this medicine? They need to know if you have any of these conditions: -Cushing's syndrome -diabetes -glaucoma -heart disease -high blood pressure -infection (especially a virus infection such as chickenpox, cold sores, or herpes) -kidney disease -liver disease -mental illness -myasthenia  gravis -osteoporosis -seizures -stomach or intestine problems -thyroid disease -an unusual or allergic reaction to lactose, prednisone, other medicines, foods, dyes, or preservatives -pregnant or trying to get pregnant -breast-feeding How should I use this medicine? Take this medicine by mouth with a glass of water. Follow the directions on the prescription label. Take this medicine with food. If you are taking this medicine once a day, take it in the morning. Do not take more medicine than you are told to take. Do not suddenly stop taking your medicine because you may develop a severe reaction. Your doctor will tell you how much medicine to take. If your doctor wants you to stop the medicine, the dose may be slowly lowered over time to avoid any side effects. Talk to your pediatrician regarding the use of this medicine in children. Special care may be needed. Overdosage: If you think you have taken too much of this medicine contact a poison control center or emergency room at once. NOTE: This medicine is only for you. Do not share this medicine with others.  What if I miss a dose? If you miss a dose, take it as soon as you can. If it is almost time for your next dose, talk to your doctor or health care professional. You may need to miss a dose or take an extra dose. Do not take double or extra doses without advice. What may interact with this medicine? Do not take this medicine with any of the following medications: -metyrapone -mifepristone This medicine may also interact with the following medications: -aminoglutethimide -amphotericin B -aspirin and aspirin-like medicines -barbiturates -certain medicines for diabetes, like glipizide or glyburide -cholestyramine -cholinesterase inhibitors -cyclosporine -digoxin -diuretics -ephedrine -male hormones, like estrogens and birth control pills -isoniazid -ketoconazole -NSAIDS, medicines for pain and inflammation, like ibuprofen or  naproxen -phenytoin -rifampin -toxoids -vaccines -warfarin This list may not describe all possible interactions. Give your health care provider a list of all the medicines, herbs, non-prescription drugs, or dietary supplements you use. Also tell them if you smoke, drink alcohol, or use illegal drugs. Some items may interact with your medicine. What should I watch for while using this medicine? Visit your doctor or health care professional for regular checks on your progress. If you are taking this medicine over a prolonged period, carry an identification card with your name and address, the type and dose of your medicine, and your doctor's name and address. This medicine may increase your risk of getting an infection. Tell your doctor or health care professional if you are around anyone with measles or chickenpox, or if you develop sores or blisters that do not heal properly. If you are going to have surgery, tell your doctor or health care professional that you have taken this medicine within the last twelve months. Ask your doctor or health care professional about your diet. You may need to lower the amount of salt you eat. This medicine may affect blood sugar levels. If you have diabetes, check with your doctor or health care professional before you change your diet or the dose of your diabetic medicine. What side effects may I notice from receiving this medicine? Side effects that you should report to your doctor or health care professional as soon as possible: -allergic reactions like skin rash, itching or hives, swelling of the face, lips, or tongue -changes in emotions or moods -changes in vision -depressed mood -eye pain -fever or chills, cough, sore throat, pain or difficulty passing urine -increased thirst -swelling of ankles, feet Side effects that usually do not require medical attention (report to your doctor or health care professional if they continue or are  bothersome): -confusion, excitement, restlessness -headache -nausea, vomiting -skin problems, acne, thin and shiny skin -trouble sleeping -weight gain This list may not describe all possible side effects. Call your doctor for medical advice about side effects. You may report side effects to FDA at 1-800-FDA-1088. Where should I keep my medicine? Keep out of the reach of children. Store at room temperature between 15 and 30 degrees C (59 and 86 degrees F). Protect from light. Keep container tightly closed. Throw away any unused medicine after the expiration date. NOTE: This sheet is a summary. It may not cover all possible information. If you have questions about this medicine, talk to your doctor, pharmacist, or health care provider.  2018 Elsevier/Gold Standard (2010-12-23 10:57:14)

## 2017-01-31 LAB — COMPREHENSIVE METABOLIC PANEL
ALBUMIN: 4.1 g/dL (ref 3.5–5.5)
ALT: 20 IU/L (ref 0–44)
AST: 14 IU/L (ref 0–40)
Albumin/Globulin Ratio: 1.5 (ref 1.2–2.2)
Alkaline Phosphatase: 97 IU/L (ref 39–117)
BUN / CREAT RATIO: 14 (ref 9–20)
BUN: 15 mg/dL (ref 6–24)
Bilirubin Total: 0.3 mg/dL (ref 0.0–1.2)
CO2: 22 mmol/L (ref 20–29)
CREATININE: 1.07 mg/dL (ref 0.76–1.27)
Calcium: 9.9 mg/dL (ref 8.7–10.2)
Chloride: 105 mmol/L (ref 96–106)
GFR calc Af Amer: 89 mL/min/{1.73_m2} (ref >59)
GFR, EST NON AFRICAN AMERICAN: 77 mL/min/{1.73_m2} (ref >59)
GLOBULIN, TOTAL: 2.8 g/dL (ref 1.5–4.5)
Glucose: 102 mg/dL — ABNORMAL HIGH (ref 65–99)
Potassium: 4.5 mmol/L (ref 3.5–5.2)
SODIUM: 143 mmol/L (ref 134–144)
Total Protein: 6.9 g/dL (ref 6.0–8.5)

## 2017-01-31 LAB — CBC
HEMATOCRIT: 44.6 % (ref 37.5–51.0)
HEMOGLOBIN: 15.1 g/dL (ref 13.0–17.7)
MCH: 29.7 pg (ref 26.6–33.0)
MCHC: 33.9 g/dL (ref 31.5–35.7)
MCV: 88 fL (ref 79–97)
Platelets: 336 10*3/uL (ref 150–379)
RBC: 5.08 x10E6/uL (ref 4.14–5.80)
RDW: 14 % (ref 12.3–15.4)
WBC: 8.6 10*3/uL (ref 3.4–10.8)

## 2017-01-31 LAB — SEDIMENTATION RATE: SED RATE: 5 mm/h (ref 0–30)

## 2017-01-31 LAB — C-REACTIVE PROTEIN: CRP: 6.9 mg/L — ABNORMAL HIGH (ref 0.0–4.9)

## 2017-02-02 ENCOUNTER — Telehealth: Payer: Self-pay

## 2017-02-02 NOTE — Telephone Encounter (Signed)
Rn call patient that his labs were unremarkable. Continue steroids as prescribed. Pt verbalized understanding. ------

## 2017-02-02 NOTE — Telephone Encounter (Signed)
-----   Message from Melvenia Beam, MD sent at 01/31/2017 10:19 AM EDT ----- Labs unremarkable, inflammatory labs were fine so unlikely Temporal Arteritis but continue the steroids as prescribed. thanks

## 2017-02-08 ENCOUNTER — Ambulatory Visit (INDEPENDENT_AMBULATORY_CARE_PROVIDER_SITE_OTHER): Payer: BLUE CROSS/BLUE SHIELD

## 2017-02-08 DIAGNOSIS — R519 Headache, unspecified: Secondary | ICD-10-CM

## 2017-02-08 DIAGNOSIS — M316 Other giant cell arteritis: Secondary | ICD-10-CM

## 2017-02-08 DIAGNOSIS — R51 Headache with orthostatic component, not elsewhere classified: Secondary | ICD-10-CM

## 2017-02-08 DIAGNOSIS — R400 Somnolence: Secondary | ICD-10-CM

## 2017-02-08 DIAGNOSIS — R0683 Snoring: Secondary | ICD-10-CM | POA: Diagnosis not present

## 2017-02-08 DIAGNOSIS — R5383 Other fatigue: Secondary | ICD-10-CM

## 2017-02-08 MED ORDER — GADOPENTETATE DIMEGLUMINE 469.01 MG/ML IV SOLN: 20.0000 mL | Freq: Once | INTRAVENOUS | Status: AC | PRN

## 2017-02-10 ENCOUNTER — Telehealth: Payer: Self-pay | Admitting: *Deleted

## 2017-02-10 NOTE — Telephone Encounter (Signed)
Have him come see me next week, you can put him in at 4:30 one day thanks

## 2017-02-10 NOTE — Telephone Encounter (Signed)
Spoke with patient and informed him that his MRI of the brain is normal. He asked about seeing Dr Jaynee Eagles sooner than scheduled FU on 03/07/17; this RN looked at schedule and advised him there is no sooner appt available.Patient stated he stopped taking Prednisone yesterday due to feeling "terrible" . He stated he is having hot flashes, is very tired and "just feels awful".  He stated his headaches are even worse than before. He would like to know what Dr Jaynee Eagles suggests he do. Advised him will discuss with her and call him back today. He verbalized understanding, appreciation.

## 2017-02-10 NOTE — Telephone Encounter (Signed)
Per Dr Jaynee Eagles, called patient and scheduled him for early follow up next Wed. Patient stated he could not come Monday. Advised him that since he is added on at the end of the day, Dr Jaynee Eagles may not be able to see him until closer to 5 pm. Per Dr Jaynee Eagles, patient may stay off prednisone. He verbalized understanding of all informaiton, appreciation of call back.

## 2017-02-15 ENCOUNTER — Ambulatory Visit (INDEPENDENT_AMBULATORY_CARE_PROVIDER_SITE_OTHER): Payer: BLUE CROSS/BLUE SHIELD | Admitting: Neurology

## 2017-02-15 ENCOUNTER — Encounter: Payer: Self-pay | Admitting: Neurology

## 2017-02-15 VITALS — BP 136/88 | HR 79 | Ht 73.0 in | Wt 215.2 lb

## 2017-02-15 DIAGNOSIS — R5382 Chronic fatigue, unspecified: Secondary | ICD-10-CM | POA: Diagnosis not present

## 2017-02-15 DIAGNOSIS — G4733 Obstructive sleep apnea (adult) (pediatric): Secondary | ICD-10-CM | POA: Diagnosis not present

## 2017-02-15 NOTE — Patient Instructions (Addendum)
Remember to drink plenty of fluid, eat healthy meals and do not skip any meals. Try to eat protein with a every meal and eat a healthy snack such as fruit or nuts in between meals. Try to keep a regular sleep-wake schedule and try to exercise daily, particularly in the form of walking, 20-30 minutes a day, if you can.

## 2017-02-15 NOTE — Progress Notes (Signed)
[facility]    Provider:  Dr Jaynee Eagles Referring Provider: Coral Spikes, DO Primary Care Physician:  Coral Spikes, DO  CC:  Headache, tinnitus, extreme fatigue, nodding off, ESS 15  Interval history 02/15/2017: Lots of hypersomnia, he is exhausted all day, could nod off anywhere, wants to take naps, continuous headache, snores occasionally but can wake wife up. Having SOB. Needs O2 monitoring overnight. His has memory changes. Wife has "nudged him" because he wasn't breathing at night. Discussion about shortness of breath, he needs to follow up with cardiology, lung and heart examination today appear normal. Reviewed MRI of the brain results which were normal, reviewed images and answered all questions. Today patient's wife is here, she also provides information and was not here at the first appointment, reviewed all relevant data with her. We'll also check a thyroid.  HPI:  Anthony Knight is a 56 y.o. male here as a referral from Dr. Lacinda Axon for headaches. This is a new headache, started 5-6 weeks ago. It started with decreased cincentration, feels like he is going to pass out, headache started a few weeks later it is in the temples, alleve, nothing OTC helps. His eyes hurt, his ears are ringing. He gets dizzy, he feels lightheaded. Never had this before and denies headaches in the past. He feels fatigued. He also hit his head in the last month which has made things worse, a work table hit him on the back of the head and neck and shoulders. Since then it is even worse, headache worsened. All these symptoms started before this incident. He feels pressure in his head, vision is impaired and blurry especially when writing or reading. Symptoms worse with heat. He wakes with the symptoms, continuous. He snores sleeping difficulty, waking frequently, very fatigued during the day. He could easily nod off anytime of the day. No light or sound sensitivity but closing his eyes helps. No nausea.  Symptoms were acute, happened overnight. He has a buzzing in the ears. No other focal neurologic deficits, associated symptoms, inciting events or modifiable factors.he has neck pain. No weakness or sensory changes.   Reviewed notes, labs and imaging from outside physicians, which showed:  Personally reviewed MRI of the brain 2017 images which were normal. Personally reviewed report CT of the head 2008 which was normal.  CBC July 2017 showed elevated white blood cells, CMP July 2017 showed elevated creatinine 1.33 and decreased GFR otherwise unremarkable.  Review of Systems: Patient complains of symptoms per HPI as well as the following symptoms: headache, dizziness. Pertinent negatives and positives per HPI. All others negative.  Social History   Social History  . Marital status: Married    Spouse name: N/A  . Number of children: N/A  . Years of education: N/A   Occupational History  . Not on file.   Social History Main Topics  . Smoking status: Former Smoker    Quit date: 02/17/1985  . Smokeless tobacco: Never Used  . Alcohol use 0.0 - 0.6 oz/week  . Drug use: No  . Sexual activity: Yes    Partners: Female   Other Topics Concern  . Not on file   Social History Narrative  . No narrative on file    Family History  Problem Relation Age of Onset  . Cancer Father        prostate  . Cancer Maternal Grandfather        lung and colon  . Cancer Maternal Uncle  colon  . Cancer Maternal Uncle        spleen  . Cancer Maternal Uncle     Past Medical History:  Diagnosis Date  . Chicken pox   . Kidney stones     Past Surgical History:  Procedure Laterality Date  . rectal tear     Per patient report  . URETERAL STENT PLACEMENT  2002   kidney stones removal as well    Current Outpatient Prescriptions  Medication Sig Dispense Refill  . aspirin EC 81 MG tablet Take 1 tablet (81 mg total) by mouth daily. 90 tablet 3  . losartan (COZAAR) 100 MG tablet Take  100 mg by mouth daily.      No current facility-administered medications for this visit.    Facility-Administered Medications Ordered in Other Visits  Medication Dose Route Frequency Provider Last Rate Last Dose  . gadopentetate dimeglumine (MAGNEVIST) injection 20 mL  20 mL Intravenous Once PRN Melvenia Beam, MD        Allergies as of 02/15/2017  . (No Known Allergies)    Vitals: BP 136/88   Pulse 79   Ht 6' 1" (1.854 m)   Wt 215 lb 3.2 oz (97.6 kg)   BMI 28.39 kg/m  Last Weight:  Wt Readings from Last 1 Encounters:  02/15/17 215 lb 3.2 oz (97.6 kg)   Last Height:   Ht Readings from Last 1 Encounters:  02/15/17 6' 1" (1.854 m)   Physical exam: Exam: Gen: NAD, conversant, well nourised, obese, well groomed                     CV: RRR, no MRG. No Carotid Bruits. No peripheral edema, warm, nontender Eyes: Conjunctivae clear without exudates or hemorrhage  Neuro: Detailed Neurologic Exam  Speech:    Speech is normal; fluent and spontaneous with normal comprehension.  Cognition:    The patient is oriented to person, place, and time;     recent and remote memory intact;     language fluent;     normal attention, concentration,     fund of knowledge Cranial Nerves:    The pupils are equal, round, and reactive to light. The fundi are normal and spontaneous venous pulsations are present. Visual fields are full to finger confrontation. Extraocular movements are intact. Trigeminal sensation is intact and the muscles of mastication are normal. The face is symmetric. The palate elevates in the midline. Hearing intact. Voice is normal. Shoulder shrug is normal. The tongue has normal motion without fasciculations.   Coordination:    Normal finger to nose and heel to shin. Normal rapid alternating movements.   Gait:    Heel-toe and tandem gait are normal.   Motor Observation:    No asymmetry, no atrophy, and no involuntary movements noted. Tone:    Normal muscle tone.     Posture:    Posture is normal. normal erect    Strength:    Strength is V/V in the upper and lower limbs.      Sensation: intact to LT     Reflex Exam:  DTR's:    Deep tendon reflexes in the upper and lower extremities are normal bilaterally.   Toes:    The toes are downgoing bilaterally.   Clonus:    Clonus is absent.   Assessment/Plan:  56 year old here with  hypersomnia, he is exhausted all day, could nod off anywhere, wants to take naps, continuous headache, snores occasionally but can  wake wife up. Having SOB. His has memory changes. Wife has "nudged him" because he wasn't breathing at night. ESS 15  Hx of 10-pack smoking, SOB, morning headaches.  Needs O2 monitoring overnight, will defer to sleep eval MRI brain w/wo contrast normal Esr/crp for temporal arteritis evaluation normal Recommend sleep evaluation for snoring, daytime somnolence, headaches, witnessedd spneic events, (see above needs o2 monitoring?) ESS 15 TSH ordered Memory changes may be due to sleep apnea, he has a sleep evaluation in the morning  Sarina Ill, MD  [facility] [address] [place], [place] [address]  Phone [phone] Fax [phone]  A total of 60 minutes was spent face-to-face with this patient. Over half this time was spent on counseling patient on the sleep apnea, headaches, SOB, memory changes diagnosis and different diagnostic and therapeutic options available.

## 2017-02-16 ENCOUNTER — Encounter: Payer: Self-pay | Admitting: Neurology

## 2017-02-16 ENCOUNTER — Ambulatory Visit (INDEPENDENT_AMBULATORY_CARE_PROVIDER_SITE_OTHER): Payer: BLUE CROSS/BLUE SHIELD | Admitting: Neurology

## 2017-02-16 VITALS — BP 140/87 | HR 83 | Ht 72.0 in | Wt 214.0 lb

## 2017-02-16 DIAGNOSIS — R0681 Apnea, not elsewhere classified: Secondary | ICD-10-CM

## 2017-02-16 DIAGNOSIS — R5382 Chronic fatigue, unspecified: Secondary | ICD-10-CM

## 2017-02-16 DIAGNOSIS — R0683 Snoring: Secondary | ICD-10-CM | POA: Diagnosis not present

## 2017-02-16 DIAGNOSIS — G4733 Obstructive sleep apnea (adult) (pediatric): Secondary | ICD-10-CM

## 2017-02-16 DIAGNOSIS — R51 Headache: Secondary | ICD-10-CM | POA: Diagnosis not present

## 2017-02-16 DIAGNOSIS — E663 Overweight: Secondary | ICD-10-CM

## 2017-02-16 DIAGNOSIS — G4719 Other hypersomnia: Secondary | ICD-10-CM

## 2017-02-16 DIAGNOSIS — R519 Headache, unspecified: Secondary | ICD-10-CM

## 2017-02-16 NOTE — Progress Notes (Signed)
Subjective:    Patient ID: Anthony Knight is a 56 y.o. male.  HPI     Star Age, MD, PhD [facility] [address]O. [place], [place] [address]  Dear Anthony Knight,   I saw your patient, Anthony Knight, upon your kind request in my clinic today for initial consultation of his sleep disorder, in particular, concern for underlying obstructive sleep apnea. The patient is unaccompanied today. As you know, Anthony Knight is a 56 year old right-handed gentleman with an underlying medical history of kidney stones, recurrent headaches for the past few months, and overweight state, who reports snoring and excessive daytime somnolence as well as witnessed apneas per wife. I reviewed your office note from 02/15/2017. His Epworth sleepiness score is 18 out of 24, fatigue score is 28 out of 63. He is married and lives with his wife. They have one child. He quit smoking over 30 years ago drinks alcohol occasionally, a few times a month or so, caffeine typically 1 cup of coffee per day. Headache not specifically worse in AM. No night to night nocturia, no RLS symptoms, no history of PLMs, no FHx of OSA, feels like he breathes very shallow at times. He has difficulty with sleep maintenance for the past year, and does not feel rested. BT is around 11 or MN and WT is 7:30 for work. He used to be able to get away with much sleep in the past, as little as 4 hours, now it seems to him that he is constantly tired and doses off and not motivated to do anything, lack of energy reported. This again, has been going on for year but the constant headache he's had for the past 6 weeks or so. He has gained weight recently due to recent steroid. He will have some blood work today to check for thyroid disease. His primary care physician is moving away and he will be establishing care with a new PCP soon.  He denies depression but is frustrated over his lack of energy and change in sleep habits as well  as this constant headache.   His Past Medical History Is Significant For: Past Medical History:  Diagnosis Date  . Chicken pox   . Kidney stones     His Past Surgical History Is Significant For: Past Surgical History:  Procedure Laterality Date  . rectal tear     Per patient report  . URETERAL STENT PLACEMENT  2002   kidney stones removal as well    His Family History Is Significant For: Family History  Problem Relation Age of Onset  . Cancer Father        prostate  . Cancer Maternal Grandfather        lung and colon  . Cancer Maternal Uncle        colon  . Cancer Maternal Uncle        spleen  . Cancer Maternal Uncle     His Social History Is Significant For: Social History   Social History  . Marital status: Married    Spouse name: N/A  . Number of children: N/A  . Years of education: N/A   Social History Main Topics  . Smoking status: Former Smoker    Quit date: 02/17/1985  . Smokeless tobacco: Never Used  . Alcohol use 0.0 - 0.6 oz/week  . Drug use: No  . Sexual activity: Yes    Partners: Female   Other Topics Concern  . None  Social History Narrative  . None    His Allergies Are:  No Known Allergies:   His Current Medications Are:  Outpatient Encounter Prescriptions as of 02/16/2017  Medication Sig  . aspirin EC 81 MG tablet Take 1 tablet (81 mg total) by mouth daily.  Marland Kitchen losartan (COZAAR) 100 MG tablet Take 100 mg by mouth daily.    Facility-Administered Encounter Medications as of 02/16/2017  Medication  . gadopentetate dimeglumine (MAGNEVIST) injection 20 mL  :  Review of Systems:  Out of a complete 14 point review of systems, all are reviewed and negative with the exception of these symptoms as listed below: Review of Systems  Neurological:       Pt presents today to discuss his sleep. Pt has never had a sleep study but does endorse occasional snoring.  Epworth Sleepiness Scale 0= would never doze 1= slight chance of dozing 2=  moderate chance of dozing 3= high chance of dozing  Sitting and reading: 3 Watching TV: 3 Sitting inactive in a public place (ex. Theater or meeting): 2 As a passenger in a car for an hour without a break: 3 Lying down to rest in the afternoon: 3 Sitting and talking to someone: 1 Sitting quietly after lunch (no alcohol): 2 In a car, while stopped in traffic: 1 Total: 18     Objective:  Neurological Exam  Physical Exam Physical Examination:   Vitals:   02/16/17 0925  BP: 140/87  Pulse: 83    General Examination: The patient is a very pleasant 56 y.o. male in no acute distress. He appears well-developed and well-nourished and well groomed.   HEENT: Normocephalic, atraumatic, pupils are equal, round and reactive to light and accommodation. Extraocular tracking is good without limitation to gaze excursion or nystagmus noted. Normal smooth pursuit is noted. Hearing is grossly intact. Face is symmetric with normal facial animation and normal facial sensation. Speech is clear with no dysarthria noted. There is no hypophonia. There is no lip, neck/head, jaw or voice tremor. Neck is supple with full range of passive and active motion. There are no carotid bruits on auscultation. Oropharynx exam reveals: mild mouth dryness, adequate dental hygiene and mild airway crowding, due to smaller airway opening, floppy appearing soft palate but uvula is small and tonsils are small as well. Mallampati is class II. Neck circumference is 17-1/4 inches. He is a mild to moderate overbite.  Chest: Clear to auscultation without wheezing, rhonchi or crackles noted.  Heart: S1+S2+0, regular and normal without murmurs, rubs or gallops noted.   Abdomen: Soft, non-tender and non-distended with normal bowel sounds appreciated on auscultation.  Extremities: There is no pitting edema in the distal lower extremities bilaterally. Pedal pulses are intact.  Skin: Warm and dry without trophic changes  noted.  Musculoskeletal: exam reveals no obvious joint deformities, tenderness or joint swelling or erythema.   Neurologically:  Mental status: The patient is awake, alert and oriented in all 4 spheres. His immediate and remote memory, attention, language skills and fund of knowledge are appropriate. There is no evidence of aphasia, agnosia, apraxia or anomia. Speech is clear with normal prosody and enunciation. Thought process is linear. Mood is normal and affect is normal.  Cranial nerves II - XII are as described above under HEENT exam. In addition: shoulder shrug is normal with equal shoulder height noted. Motor exam: Normal bulk, strength and tone is noted. There is no drift, tremor or rebound. Romberg is negative. Reflexes are 2+ throughout. Fine motor skills  and coordination: intact with normal finger taps, normal hand movements, normal rapid alternating patting, normal foot taps and normal foot agility.  Cerebellar testing: No dysmetria or intention tremor on finger to nose testing. Heel to shin is unremarkable bilaterally. There is no truncal or gait ataxia.  Sensory exam: intact to light touch in the upper and lower extremities.  Gait, station and balance: He stands easily. No veering to one side is noted. No leaning to one side is noted. Posture is age-appropriate and stance is narrow based. Gait shows normal stride length and normal pace. No problems turning are noted. Tandem walk is unremarkable.   Assessment and Plan:  In summary, HAYNES GIANNOTTI is a very pleasant 56 y.o.-year old male with an underlying medical history of kidney stones, recurrent headaches for the past few months, and overweight state, whose history and physical exam are concerning for obstructive sleep apnea (OSA). I had a long chat with the patient about my findings and the diagnosis of OSA, its prognosis and treatment options. We talked about medical treatments, surgical interventions and non-pharmacological  approaches. I explained in particular the risks and ramifications of untreated moderate to severe OSA, especially with respect to developing cardiovascular disease down the Road, including congestive heart failure, difficult to treat hypertension, cardiac arrhythmias, or stroke. Even type 2 diabetes has, in part, been linked to untreated OSA. Symptoms of untreated OSA include daytime sleepiness, memory problems, mood irritability and mood disorder such as depression and anxiety, lack of energy, as well as recurrent headaches, especially morning headaches. We talked about trying to maintain a healthy lifestyle in general, as well as the importance of weight control. I encouraged the patient to eat healthy, exercise daily and keep well hydrated, to keep a scheduled bedtime and wake time routine, to not skip any meals and eat healthy snacks in between meals. I advised the patient not to drive when feeling sleepy. I recommended the following at this time: sleep study with potential positive airway pressure titration. (We will score hypopneas at 3%).   I explained the sleep test procedure to the patient and also outlined possible surgical and non-surgical treatment options of OSA, including the use of a custom-made dental device (which would require a referral to a specialist dentist or oral surgeon), upper airway surgical options, such as pillar implants, radiofrequency surgery, tongue base surgery, and UPPP (which would involve a referral to an ENT surgeon). Rarely, jaw surgery such as mandibular advancement may be considered.  I also explained the CPAP treatment option to the patient, who indicated that he would be willing to try CPAP if the need arises. I explained the importance of being compliant with PAP treatment, not only for insurance purposes but primarily to improve His symptoms, and for the patient's long term health benefit, including to reduce His cardiovascular risks. I answered all his questions  today and the patient was in agreement. I will likely see him back after the sleep study is completed and encouraged him to call with any interim questions, concerns, problems or updates.   Thank you very much for allowing me to participate in the care of this nice patient. If I can be of any further assistance to you please do not hesitate to talk to me.  Sincerely,   Star Age, MD, PhD

## 2017-02-16 NOTE — Patient Instructions (Addendum)
Thank you for choosing [facility] for your sleep related care! It was nice to meet you today!   Here is what we discussed today and what we came up with as our plan for you:    Based on your symptoms and your exam I believe you are at risk for obstructive sleep apnea or OSA, and I think we should proceed with a sleep study to determine whether you do or do not have OSA and how severe it is. If you have more than mild OSA, I want you to consider treatment with CPAP. Please remember, the risks and ramifications of moderate to severe obstructive sleep apnea or OSA are: Cardiovascular disease, including congestive heart failure, stroke, difficult to control hypertension, arrhythmias, and even type 2 diabetes has been linked to untreated OSA. Sleep apnea causes disruption of sleep and sleep deprivation in most cases, which, in turn, can cause recurrent headaches, problems with memory, mood, concentration, focus, and vigilance. Most people with untreated sleep apnea report excessive daytime sleepiness, which can affect their ability to drive. Please do not drive if you feel sleepy.   Think about having your testosterone level checked, as well as B12, vit D with PCP.  I will likely see you back after your sleep study to go over the test results and where to go from there. We will call you after your sleep study to advise about the results (most likely, you will hear from [place], my nurse) and to set up an appointment at the time, as necessary.    Our sleep lab administrative assistant, Arrie Aran will call you to schedule your sleep study. If you don't hear back from her by about 2 weeks from now, please feel free to call her at [phone]. You can leave a message with your phone number and concerns, if you get the voicemail box. She will call back as soon as possible.

## 2017-02-17 LAB — THYROID PANEL WITH TSH
FREE THYROXINE INDEX: 1.6 (ref 1.2–4.9)
T3 Uptake Ratio: 21 % — ABNORMAL LOW (ref 24–39)
T4 TOTAL: 7.5 ug/dL (ref 4.5–12.0)
TSH: 1.6 u[IU]/mL (ref 0.450–4.500)

## 2017-02-22 ENCOUNTER — Telehealth: Payer: Self-pay | Admitting: *Deleted

## 2017-02-22 NOTE — Telephone Encounter (Signed)
-----   Message from Melvenia Beam, MD sent at 02/17/2017 10:41 AM EDT ----- tsh unremarkable

## 2017-02-22 NOTE — Telephone Encounter (Signed)
I spoke to pt and relayed that his lab results were unremarkable.  He verbalized understanding.

## 2017-03-07 ENCOUNTER — Institutional Professional Consult (permissible substitution): Payer: BLUE CROSS/BLUE SHIELD | Admitting: Neurology

## 2017-03-07 ENCOUNTER — Ambulatory Visit: Payer: BLUE CROSS/BLUE SHIELD | Admitting: Neurology

## 2017-03-08 ENCOUNTER — Telehealth: Payer: Self-pay | Admitting: Neurology

## 2017-03-08 DIAGNOSIS — R0683 Snoring: Secondary | ICD-10-CM

## 2017-03-08 DIAGNOSIS — R0681 Apnea, not elsewhere classified: Secondary | ICD-10-CM

## 2017-03-08 DIAGNOSIS — G4719 Other hypersomnia: Secondary | ICD-10-CM

## 2017-03-08 DIAGNOSIS — E663 Overweight: Secondary | ICD-10-CM

## 2017-03-08 NOTE — Telephone Encounter (Signed)
This patient's insurance denied an attended sleep study. I will order home sleep test.  

## 2017-03-08 NOTE — Telephone Encounter (Signed)
[employer] denied Split sleep and approved HST.  Can I get an order for HST? °

## 2017-03-09 ENCOUNTER — Institutional Professional Consult (permissible substitution): Payer: BLUE CROSS/BLUE SHIELD | Admitting: Neurology

## 2017-03-21 ENCOUNTER — Ambulatory Visit (INDEPENDENT_AMBULATORY_CARE_PROVIDER_SITE_OTHER): Payer: BLUE CROSS/BLUE SHIELD | Admitting: Neurology

## 2017-03-21 DIAGNOSIS — G4733 Obstructive sleep apnea (adult) (pediatric): Secondary | ICD-10-CM

## 2017-03-21 DIAGNOSIS — R0683 Snoring: Secondary | ICD-10-CM

## 2017-03-21 DIAGNOSIS — E663 Overweight: Secondary | ICD-10-CM

## 2017-03-21 DIAGNOSIS — G4719 Other hypersomnia: Secondary | ICD-10-CM

## 2017-03-21 DIAGNOSIS — R0681 Apnea, not elsewhere classified: Secondary | ICD-10-CM

## 2017-03-27 ENCOUNTER — Telehealth: Payer: Self-pay | Admitting: Neurology

## 2017-03-27 NOTE — Progress Notes (Signed)
Patient referred by Dr. Jaynee Eagles, seen by me on 02/16/17, HST on 03/22/17.   Please call and notify the patient that the recent home sleep test did not show any significant obstructive sleep apnea. Patient can follow up with the referring provider.  Please remind patient to try to maintain good sleep hygiene, which means: Keep a regular sleep and wake schedule and make enough time for sleep (7 1/2 to 8 1/2 hours for the average adult), try not to exercise or have a meal within 2 hours of your bedtime, try to keep your bedroom conducive for sleep, that is, cool and dark, without light distractors such as an illuminated alarm clock, and refrain from watching TV right before sleep or in the middle of the night and do not keep the TV or radio on during the night. If a nightlight is used, have it away from the visual field. Also, try not to use or play on electronic devices at bedtime, such as your cell phone, tablet PC or laptop. If you like to read at bedtime on an electronic device, try to dim the background light as much as possible. Do not eat in the middle of the night. Keep pets away from the bedroom environment. For stress relief, try meditation, deep breathing exercises (there are many books and CDs available), a white noise machine or fan can help to diffuse other noise distractors, such as traffic noise. Do not drink alcohol before bedtime, as it can disturb sleep and cause middle of the night awakenings. Never mix alcohol and sedating medications! Avoid narcotic pain medication close to bedtime, as opioids/narcotics can suppress breathing drive and breathing effort.   Once you have spoken to patient, you can close this encounter.   Thanks,  Star Age, MD, PhD [facility])

## 2017-03-27 NOTE — Telephone Encounter (Signed)
Called to discuss sleep study results. No answer. LVM for pt to return call.

## 2017-03-27 NOTE — Telephone Encounter (Signed)
-----   Message from Star Age, MD sent at 03/27/2017 10:28 AM EST ----- Patient referred by Dr. Jaynee Eagles, seen by me on 02/16/17, HST on 03/22/17.   Please call and notify the patient that the recent home sleep test did not show any significant obstructive sleep apnea. Patient can follow up with the referring provider.  Please remind patient to try to maintain good sleep hygiene, which means: Keep a regular sleep and wake schedule and make enough time for sleep (7 1/2 to 8 1/2 hours for the average adult), try not to exercise or have a meal within 2 hours of your bedtime, try to keep your bedroom conducive for sleep, that is, cool and dark, without light distractors such as an illuminated alarm clock, and refrain from watching TV right before sleep or in the middle of the night and do not keep the TV or radio on during the night. If a nightlight is used, have it away from the visual field. Also, try not to use or play on electronic devices at bedtime, such as your cell phone, tablet PC or laptop. If you like to read at bedtime on an electronic device, try to dim the background light as much as possible. Do not eat in the middle of the night. Keep pets away from the bedroom environment. For stress relief, try meditation, deep breathing exercises (there are many books and CDs available), a white noise machine or fan can help to diffuse other noise distractors, such as traffic noise. Do not drink alcohol before bedtime, as it can disturb sleep and cause middle of the night awakenings. Never mix alcohol and sedating medications! Avoid narcotic pain medication close to bedtime, as opioids/narcotics can suppress breathing drive and breathing effort.   Once you have spoken to patient, you can close this encounter.   Thanks,  Star Age, MD, PhD [facility])

## 2017-03-27 NOTE — Telephone Encounter (Signed)
Pt returned call, I went over the sleep study and the recommendations listed by Dr Rexene Alberts. Pt verbalized understanding. Pt had no questions at this time but was encouraged to call back if questions arise. I offered a follow apt to go over the results pt declined at this time.

## 2017-03-27 NOTE — Procedures (Signed)
[facility] [address] [place], [place] [address] NAME:     Anthony Knight                                                        DOB: 07/21/1960 MEDICAL RECORD NUMBER   [id number]                                       DOS: 03/22/17 REFERRING PHYSICIAN: Sarina Ill, MD STUDY PERFORMED: Home Sleep Study HISTORY: 56 year old man with a history of kidney stones, recurrent headaches for the past few months, and overweight state, who reports snoring and excessive daytime somnolence as well as witnessed apneas per wife.  STUDY RESULTS: Total Recording:    6 Hours, 21 Minutes Total Apnea/Hypopnea Index (AHI):   4.9/Hour Average Oxygen Saturation:  93% Lowest Oxygen Saturation:  82%  Time Oxygen Saturation Below or at 88%: 5 min (1%) Average Heart Rate:     72  BPM IMPRESSION: Borderline OSA RECOMMENDATION: This home sleep test does not demonstrate any significant obstructive or central sleep disordered breathing with a borderline AHI of 4.9/hour. Other causes of the patient's symptoms, including circadian rhythm disturbances, an underlying mood disorder, medication effect and/or an underlying medical problem cannot be ruled out based on this test. Clinical correlation is recommended. The patient should be cautioned not to drive, work at heights, or operate dangerous or heavy equipment when tired or sleepy. Review and reiteration of good sleep hygiene measures should be pursued with any patient.  The patient and his referring provider will be notified of the test results.   I certify that I have reviewed the raw data recording prior to the issuance of this report in accordance with the standards of the American Academy of Sleep Medicine (AASM).  Star Age, MD, PhD Diplomat, ABPN (Neurology and Sleep)

## 2017-04-24 ENCOUNTER — Other Ambulatory Visit: Payer: Self-pay | Admitting: General Surgery

## 2017-04-24 DIAGNOSIS — K5732 Diverticulitis of large intestine without perforation or abscess without bleeding: Secondary | ICD-10-CM | POA: Diagnosis not present

## 2017-05-01 ENCOUNTER — Other Ambulatory Visit: Payer: BLUE CROSS/BLUE SHIELD

## 2017-05-05 ENCOUNTER — Ambulatory Visit
Admission: RE | Admit: 2017-05-05 | Discharge: 2017-05-05 | Disposition: A | Discharge status: Home / Self Care | Payer: BLUE CROSS/BLUE SHIELD | Source: Ambulatory Visit | Attending: General Surgery | Admitting: General Surgery

## 2017-05-05 DIAGNOSIS — R109 Unspecified abdominal pain: Secondary | ICD-10-CM | POA: Diagnosis not present

## 2017-05-05 DIAGNOSIS — K5732 Diverticulitis of large intestine without perforation or abscess without bleeding: Secondary | ICD-10-CM

## 2017-05-05 MED ORDER — IOPAMIDOL (ISOVUE-300) INJECTION 61%
125.0000 mL | Freq: Once | INTRAVENOUS | Status: AC | PRN
  Administered 2017-05-05: 125 mL via INTRAVENOUS

## 2017-05-09 ENCOUNTER — Ambulatory Visit: Payer: Self-pay | Admitting: General Surgery

## 2017-05-09 DIAGNOSIS — K5732 Diverticulitis of large intestine without perforation or abscess without bleeding: Secondary | ICD-10-CM | POA: Diagnosis not present

## 2017-05-18 ENCOUNTER — Ambulatory Visit (HOSPITAL_COMMUNITY)
Admission: RE | Admit: 2017-05-18 | Payer: BLUE CROSS/BLUE SHIELD | Source: Ambulatory Visit | Admitting: General Surgery

## 2017-05-18 ENCOUNTER — Encounter (HOSPITAL_COMMUNITY): Admission: RE | Payer: Self-pay | Source: Ambulatory Visit

## 2017-05-18 SURGERY — COLONOSCOPY
Anesthesia: Moderate Sedation

## 2017-05-19 NOTE — Pre-Procedure Instructions (Signed)
Anthony Knight  05/19/2017      [name], [name] [address] Phone: [phone] Fax: [phone]    Your procedure is scheduled on 05/26/17.  Report to [facility] Admitting at 730 A.M.  Call this number if you have problems the morning of surgery:  [phone]   Remember:  Do not eat food or drink liquids after midnight.  Take these medicines the morning of surgery with A SIP OF WATER      None  Except drink ensure drink given to you at preop appt. Complete am of surgery before you leave home for hospital.  STOP all herbel meds, nsaids (aleve,naproxen,advil,ibuprofen) prior to surgery starting (today 05/22/17) including all vitamins/supplements,fish oil.   Stop aspirin unless instructed otherwise by your dr.   Lazaro Arms not wear jewelry,  Do not wear lotions, powders, or perfumes, or deodorant.  Do not shave 48 hours prior to surgery.  Men may shave face and neck.  Do not bring valuables to the hospital.  [facility] is not responsible for any belongings or valuables.  Contacts, dentures or bridgework may not be worn into surgery.  Leave your suitcase in the car.  After surgery it may be brought to your room.  For patients admitted to the hospital, discharge time will be determined by your treatment team.  Patients discharged the day of surgery will not be allowed to drive home.   Special instructions:   Special Instructions: [facility] - Preparing for Surgery  Before surgery, you can play an important role.  Because skin is not sterile, your skin needs to be as free of germs as possible.  You can reduce the number of germs on you skin by washing with CHG (chlorahexidine gluconate) soap before surgery.  CHG is an antiseptic cleaner which kills germs and bonds with the skin to continue killing germs even after washing.  Please DO NOT use if you have an allergy to CHG or antibacterial soaps.   If your skin becomes reddened/irritated stop using the CHG and inform your nurse when you arrive at Short Stay.  Do not shave (including legs and underarms) for at least 48 hours prior to the first CHG shower.  You may shave your face.  Please follow these instructions carefully:   1.  Shower with CHG Soap the night before surgery and the morning of Surgery.  2.  If you choose to wash your hair, wash your hair first as usual with your normal shampoo.  3.  After you shampoo, rinse your hair and body thoroughly to remove the Shampoo.  4.  Use CHG as you would any other liquid soap.  You can apply chg directly  to the skin and wash gently with scrungie or a clean washcloth.  5.  Apply the CHG Soap to your body ONLY FROM THE NECK DOWN.  Do not use on open wounds or open sores.  Avoid contact with your eyes ears, mouth and genitals (private parts).  Wash genitals (private parts)       with your normal soap.  6.  Wash thoroughly, paying special attention to the area where your surgery will be performed.  7.  Thoroughly rinse your body with warm water from the neck down.  8.  DO NOT shower/wash with your normal soap after using and rinsing off the CHG Soap.  9.  Pat yourself dry with a clean towel.  10.  Wear clean pajamas.            11.  Place clean sheets on your bed the night of your first shower and do not sleep with pets.  Day of Surgery  Do not apply any lotions/deodorants the morning of surgery.  Please wear clean clothes to the hospital/surgery center.  Please read over the fact sheets that you were given.

## 2017-05-22 ENCOUNTER — Encounter (HOSPITAL_COMMUNITY)
Admission: RE | Admit: 2017-05-22 | Discharge: 2017-05-22 | Disposition: A | Discharge status: Home / Self Care | Payer: BLUE CROSS/BLUE SHIELD | Source: Ambulatory Visit | Attending: General Surgery | Admitting: General Surgery

## 2017-05-22 ENCOUNTER — Other Ambulatory Visit: Payer: Self-pay

## 2017-05-22 ENCOUNTER — Encounter (HOSPITAL_COMMUNITY): Payer: Self-pay

## 2017-05-22 DIAGNOSIS — Z01810 Encounter for preprocedural cardiovascular examination: Secondary | ICD-10-CM | POA: Diagnosis not present

## 2017-05-22 DIAGNOSIS — Z01812 Encounter for preprocedural laboratory examination: Secondary | ICD-10-CM | POA: Diagnosis not present

## 2017-05-22 HISTORY — DX: Essential (primary) hypertension: I10

## 2017-05-22 HISTORY — DX: Personal history of urinary calculi: Z87.442

## 2017-05-22 LAB — TYPE AND SCREEN
ABO/RH(D): A POS
ANTIBODY SCREEN: NEGATIVE

## 2017-05-22 LAB — BASIC METABOLIC PANEL
ANION GAP: 6 (ref 5–15)
BUN: 14 mg/dL (ref 6–20)
CALCIUM: 9.6 mg/dL (ref 8.9–10.3)
CO2: 27 mmol/L (ref 22–32)
Chloride: 106 mmol/L (ref 101–111)
Creatinine, Ser: 1.14 mg/dL (ref 0.61–1.24)
GLUCOSE: 103 mg/dL — AB (ref 65–99)
POTASSIUM: 4.1 mmol/L (ref 3.5–5.1)
SODIUM: 139 mmol/L (ref 135–145)

## 2017-05-22 LAB — CBC
HCT: 45.9 % (ref 39.0–52.0)
Hemoglobin: 15.1 g/dL (ref 13.0–17.0)
MCH: 29.5 pg (ref 26.0–34.0)
MCHC: 32.9 g/dL (ref 30.0–36.0)
MCV: 89.6 fL (ref 78.0–100.0)
PLATELETS: 315 10*3/uL (ref 150–400)
RBC: 5.12 MIL/uL (ref 4.22–5.81)
RDW: 14 % (ref 11.5–15.5)
WBC: 7.9 10*3/uL (ref 4.0–10.5)

## 2017-05-22 LAB — ABO/RH: ABO/RH(D): A POS

## 2017-05-22 NOTE — Progress Notes (Signed)
Anthony Knight states he was seeing a Anthony Knight at [facility] but md is since leaving or left, and hasn't gotten a new PCP. He has gone to see Anthony Knight back in 2016-17 for "dizzy spells"--patient states tests results "normal" Anthony Knight still goes to see Anthony Knight ([place])  Anthony Knight 05/2016 [phone] Patient understands bowel prep instructions.  Is scheduled to meet "Anthony Knight" [phone] for further prep info today.  I have called her and left a message as to when he will be finished here. Home sleep study came back (-) per patient.   Denies murmur, cp, sob.

## 2017-05-22 NOTE — Pre-Procedure Instructions (Signed)
Anthony Knight  05/22/2017      [address], [place] [address] Phone: [phone] Fax: [phone]    Your procedure is scheduled on Tuesday, 05/26/17   Report to [facility] Admitting at 730 A.M.             (posted surgery time 9:30 a - 12:30 p)   Call this number if you have problems the morning of surgery:  [phone]   Remember:   Do not eat food or drink liquids after midnight, Monday.   Take these medicines the morning of surgery with a sip of water:  None                     Except drink ensure drink given to you at preop appt. Complete am of surgery before you leave home for hospital.  STOP all herbel meds, nsaids (aleve,naproxen,advil,ibuprofen) prior to surgery starting (today 05/22/17) including all vitamins/supplements,fish oil.   Stop aspirin unless instructed otherwise by your dr.   Lazaro Arms not wear jewelry - no rings or watches.  Do not wear lotions, colognes or deodorant.             Men may shave face and neck.   Do not bring valuables to the hospital.  [facility] is not responsible for any belongings or valuables.  Contacts, dentures or bridgework may not be worn into surgery.  Leave your suitcase in the car.  After surgery it may be brought to your room.  For patients admitted to the hospital, discharge time will be determined by your treatment team.      Special Instructions: [place] - Preparing for Surgery  Before surgery, you can play an important role.  Because skin is not sterile, your skin needs to be as free of germs as possible.  You can reduce the number of germs on you skin by washing with CHG (chlorahexidine gluconate) soap before surgery.  CHG is an antiseptic cleaner which kills germs and bonds with the skin to continue killing germs even after washing.  Please DO NOT use if you have an allergy to CHG or antibacterial soaps.  If your skin becomes  reddened/irritated stop using the CHG and inform your nurse when you arrive at Short Stay.  Do not shave (including legs and underarms) for at least 48 hours prior to the first CHG shower.  You may shave your face.  Please follow these instructions carefully:   1.  Shower with CHG Soap the night before surgery and the morning of Surgery.  2.  If you choose to wash your hair, wash your hair first as usual with your normal shampoo.  3.  After you shampoo, rinse your hair and body thoroughly to remove the Shampoo.  4.  Use CHG as you would any other liquid soap.  You can apply chg directly  to the skin and wash gently with scrungie or a clean washcloth.   5.  Apply the CHG Soap to your body ONLY FROM THE NECK DOWN.  Do not use on open wounds or open sores.  Avoid contact with your eyes ears, mouth and genitals (private parts).  Wash genitals (private parts) with your normal soap.   6.  Wash thoroughly, paying special attention to the area where your surgery will be performed.  7.  Thoroughly rinse your body with warm water from the neck down.  8.  DO NOT shower/wash with your normal soap after using and rinsing off the CHG Soap.  9.  Pat yourself dry with a clean towel.            10.  Wear clean pajamas.            11.  Place clean sheets on your bed the night of your first shower and do not sleep with pets.  Day of Surgery  Do not apply any lotions/deodorants the morning of surgery.  Please wear clean clothes to the hospital/surgery center.  Please read over the fact sheets that you were given.

## 2017-05-24 NOTE — Progress Notes (Signed)
Anesthesia chart review: Patient is a 57 year old male scheduled for laparoscopic-assisted sigmoid colectomy on 05/26/2017 by Dr. Autumn Messing. Diagnosis: Sigmoid colon stricture.  History includes former smoker (quit '86), nephrolithiasis, hypertension.  PCP was Dr. Thersa Salt with [facility], but reportedly is leaving/or left the practice.    Cardiologist is Dr. Bartholome Bill Jefm Bryant; [place]). Last visit 04/08/16 for chest pain follow-up with non-ischemic stress echo in 2016.   Meds include aspirin 81 mg, losartan.  BP (!) 143/88   Pulse 80   Temp 36.4 C   Resp 20   Ht 6' (1.829 m)   Wt 219 lb 1.6 oz (99.4 kg)   SpO2 99%   BMI 29.72 kg/m   EKG 05/22/17: NSR, consider inferior infarct (age undetermined). Outside tracing is dark, but I believe his EKG appears stable when compared to 04/08/16 from [facility].  Stress echo 03/11/15 ([place]): INTERPRETATION Normal Stress Echocardiogram NORMAL RIGHT VENTRICULAR SYSTOLIC FUNCTION TRIVIAL REGURGITATION NOTED (See above) NO VALVULAR STENOSIS NOTED Resting EF: >55% (Est.) Post Stress EF:>55% (Est.) ECG Results: Normal Contraction: Normal Contraction: Normal Mitral: TRIVIAL MR Tricuspid: TRIVIAL TR  No significant OSA noted on recent sleep study (02/2017).  Preoperative labs noted.  If no acute changes then I anticipate that he can proceed as planned.  George Hugh [facility] Short Stay Center/Anesthesiology Phone [phone] 05/24/2017 1:18 PM

## 2017-05-26 ENCOUNTER — Inpatient Hospital Stay (HOSPITAL_COMMUNITY)
Admission: RE | Admit: 2017-05-26 | Discharge: 2017-06-03 | DRG: 330 | Disposition: A | Discharge status: Home Health | Payer: BLUE CROSS/BLUE SHIELD | Source: Ambulatory Visit | Attending: General Surgery | Admitting: General Surgery

## 2017-05-26 ENCOUNTER — Inpatient Hospital Stay (HOSPITAL_COMMUNITY): Payer: BLUE CROSS/BLUE SHIELD | Admitting: Vascular Surgery

## 2017-05-26 ENCOUNTER — Inpatient Hospital Stay (HOSPITAL_COMMUNITY): Payer: BLUE CROSS/BLUE SHIELD | Admitting: Anesthesiology

## 2017-05-26 ENCOUNTER — Encounter (HOSPITAL_COMMUNITY)
Admission: RE | Disposition: A | Discharge status: Home Health | Payer: Self-pay | Source: Ambulatory Visit | Attending: General Surgery

## 2017-05-26 ENCOUNTER — Encounter (HOSPITAL_COMMUNITY): Payer: Self-pay | Admitting: Anesthesiology

## 2017-05-26 DIAGNOSIS — R1032 Left lower quadrant pain: Secondary | ICD-10-CM | POA: Diagnosis present

## 2017-05-26 DIAGNOSIS — L03311 Cellulitis of abdominal wall: Secondary | ICD-10-CM | POA: Diagnosis not present

## 2017-05-26 DIAGNOSIS — K567 Ileus, unspecified: Secondary | ICD-10-CM | POA: Diagnosis not present

## 2017-05-26 DIAGNOSIS — I10 Essential (primary) hypertension: Secondary | ICD-10-CM | POA: Diagnosis not present

## 2017-05-26 DIAGNOSIS — K598 Other specified functional intestinal disorders: Secondary | ICD-10-CM | POA: Diagnosis not present

## 2017-05-26 DIAGNOSIS — R509 Fever, unspecified: Secondary | ICD-10-CM

## 2017-05-26 DIAGNOSIS — K56699 Other intestinal obstruction unspecified as to partial versus complete obstruction: Secondary | ICD-10-CM | POA: Diagnosis present

## 2017-05-26 DIAGNOSIS — R109 Unspecified abdominal pain: Secondary | ICD-10-CM | POA: Diagnosis not present

## 2017-05-26 DIAGNOSIS — C187 Malignant neoplasm of sigmoid colon: Secondary | ICD-10-CM | POA: Diagnosis not present

## 2017-05-26 DIAGNOSIS — Z800 Family history of malignant neoplasm of digestive organs: Secondary | ICD-10-CM

## 2017-05-26 DIAGNOSIS — K5732 Diverticulitis of large intestine without perforation or abscess without bleeding: Secondary | ICD-10-CM | POA: Diagnosis present

## 2017-05-26 DIAGNOSIS — C772 Secondary and unspecified malignant neoplasm of intra-abdominal lymph nodes: Secondary | ICD-10-CM | POA: Diagnosis not present

## 2017-05-26 DIAGNOSIS — N400 Enlarged prostate without lower urinary tract symptoms: Secondary | ICD-10-CM | POA: Diagnosis not present

## 2017-05-26 DIAGNOSIS — J9811 Atelectasis: Secondary | ICD-10-CM | POA: Diagnosis not present

## 2017-05-26 HISTORY — PX: SIGMOIDOSCOPY: SHX6686

## 2017-05-26 HISTORY — PX: LAPAROSCOPIC SIGMOID COLECTOMY: SHX5928

## 2017-05-26 HISTORY — DX: Other intestinal obstruction unspecified as to partial versus complete obstruction: K56.699

## 2017-05-26 SURGERY — COLECTOMY, SIGMOID, LAPAROSCOPIC
Anesthesia: General | Site: Rectum

## 2017-05-26 MED ORDER — HYDROMORPHONE HCL 1 MG/ML IJ SOLN
INTRAMUSCULAR | Status: AC
  Administered 2017-05-26: 0.5 mg via INTRAVENOUS
  Filled 2017-05-26: qty 1

## 2017-05-26 MED ORDER — ONDANSETRON HCL 4 MG/2ML IJ SOLN
INTRAMUSCULAR | Status: DC | PRN
  Administered 2017-05-26: 4 mg via INTRAVENOUS

## 2017-05-26 MED ORDER — ONDANSETRON HCL 4 MG/2ML IJ SOLN
4.0000 mg | Freq: Four times a day (QID) | INTRAMUSCULAR | Status: DC | PRN
  Administered 2017-05-27 (×2): 4 mg via INTRAVENOUS
  Filled 2017-05-26 (×2): qty 2

## 2017-05-26 MED ORDER — PHENYLEPHRINE 40 MCG/ML (10ML) SYRINGE FOR IV PUSH (FOR BLOOD PRESSURE SUPPORT)
PREFILLED_SYRINGE | INTRAVENOUS | Status: AC
  Filled 2017-05-26: qty 10

## 2017-05-26 MED ORDER — CHLORHEXIDINE GLUCONATE CLOTH 2 % EX PADS: 6.0000 | MEDICATED_PAD | Freq: Once | CUTANEOUS | Status: DC

## 2017-05-26 MED ORDER — FENTANYL CITRATE (PF) 250 MCG/5ML IJ SOLN
INTRAMUSCULAR | Status: AC
Start: 2017-05-26 — End: 2017-05-26
  Filled 2017-05-26: qty 5

## 2017-05-26 MED ORDER — SODIUM CHLORIDE 0.9 % IR SOLN
Status: DC | PRN
  Administered 2017-05-26: 1000 mL

## 2017-05-26 MED ORDER — DIPHENHYDRAMINE HCL 50 MG/ML IJ SOLN
12.5000 mg | Freq: Four times a day (QID) | INTRAMUSCULAR | Status: DC | PRN
  Filled 2017-05-26: qty 1

## 2017-05-26 MED ORDER — ONDANSETRON HCL 4 MG/2ML IJ SOLN: 4.0000 mg | Freq: Four times a day (QID) | INTRAMUSCULAR | Status: DC | PRN

## 2017-05-26 MED ORDER — EPHEDRINE SULFATE-NACL 50-0.9 MG/10ML-% IV SOSY
PREFILLED_SYRINGE | INTRAVENOUS | Status: DC | PRN
  Administered 2017-05-26 (×3): 10 mg via INTRAVENOUS

## 2017-05-26 MED ORDER — SUGAMMADEX SODIUM 200 MG/2ML IV SOLN
INTRAVENOUS | Status: DC | PRN
  Administered 2017-05-26: 200 mg via INTRAVENOUS

## 2017-05-26 MED ORDER — DEXAMETHASONE SODIUM PHOSPHATE 4 MG/ML IJ SOLN
INTRAMUSCULAR | Status: DC | PRN
  Administered 2017-05-26: 8 mg via INTRAVENOUS

## 2017-05-26 MED ORDER — KETAMINE HCL 100 MG/ML IJ SOLN
INTRAMUSCULAR | Status: AC
  Filled 2017-05-26: qty 1

## 2017-05-26 MED ORDER — LIDOCAINE 2% (20 MG/ML) 5 ML SYRINGE
INTRAMUSCULAR | Status: AC
  Filled 2017-05-26: qty 10

## 2017-05-26 MED ORDER — LOSARTAN POTASSIUM 50 MG PO TABS
100.0000 mg | ORAL_TABLET | Freq: Every day | ORAL | Status: DC
  Administered 2017-05-26 – 2017-06-03 (×6): 100 mg via ORAL
  Filled 2017-05-26 (×8): qty 2

## 2017-05-26 MED ORDER — METRONIDAZOLE IN NACL 5-0.79 MG/ML-% IV SOLN
500.0000 mg | INTRAVENOUS | Status: AC
  Administered 2017-05-26: 500 mg via INTRAVENOUS
  Filled 2017-05-26: qty 100

## 2017-05-26 MED ORDER — FENTANYL CITRATE (PF) 250 MCG/5ML IJ SOLN
INTRAMUSCULAR | Status: AC
  Filled 2017-05-26: qty 5

## 2017-05-26 MED ORDER — METHOCARBAMOL 500 MG PO TABS
500.0000 mg | ORAL_TABLET | Freq: Four times a day (QID) | ORAL | Status: DC | PRN
  Administered 2017-05-29 – 2017-06-03 (×3): 500 mg via ORAL
  Filled 2017-05-26 (×5): qty 1

## 2017-05-26 MED ORDER — DEXAMETHASONE SODIUM PHOSPHATE 10 MG/ML IJ SOLN
INTRAMUSCULAR | Status: AC
  Filled 2017-05-26: qty 1

## 2017-05-26 MED ORDER — KCL IN DEXTROSE-NACL 20-5-0.9 MEQ/L-%-% IV SOLN
INTRAVENOUS | Status: DC
  Administered 2017-05-26 – 2017-06-01 (×10): via INTRAVENOUS
  Filled 2017-05-26 (×16): qty 1000

## 2017-05-26 MED ORDER — MORPHINE SULFATE 2 MG/ML IV SOLN
INTRAVENOUS | Status: DC
  Administered 2017-05-26: 18:00:00 via INTRAVENOUS
  Administered 2017-05-26: 4.4 mg via INTRAVENOUS
  Administered 2017-05-26: 3 mg via INTRAVENOUS
  Administered 2017-05-27: 7.2 mg via INTRAVENOUS
  Administered 2017-05-27: 1.5 mg via INTRAVENOUS
  Administered 2017-05-27: 3 mg via INTRAVENOUS
  Administered 2017-05-27: 1.5 mg via INTRAVENOUS
  Administered 2017-05-27 – 2017-05-28 (×2): 6 mg via INTRAVENOUS
  Administered 2017-05-28: 13.5 mg via INTRAVENOUS
  Administered 2017-05-28: 10.5 mg via INTRAVENOUS
  Administered 2017-05-28: 3 mg via INTRAVENOUS
  Administered 2017-05-28: 1.5 mg via INTRAVENOUS
  Administered 2017-05-28: 4.5 mg via INTRAVENOUS
  Administered 2017-05-29: 7.5 mg via INTRAVENOUS
  Administered 2017-05-29: 15:00:00 via INTRAVENOUS
  Administered 2017-05-29: 7.5 mg via INTRAVENOUS
  Administered 2017-05-29: 1.5 mg via INTRAVENOUS
  Administered 2017-05-29: 4.5 mg via INTRAVENOUS
  Administered 2017-05-29: 3 mg via INTRAVENOUS
  Administered 2017-05-30: 1.5 mg via INTRAVENOUS
  Administered 2017-05-30: 0 mg via INTRAVENOUS
  Filled 2017-05-26 (×4): qty 30

## 2017-05-26 MED ORDER — LACTATED RINGERS IV SOLN
INTRAVENOUS | Status: DC
  Administered 2017-05-26 (×2): via INTRAVENOUS

## 2017-05-26 MED ORDER — 0.9 % SODIUM CHLORIDE (POUR BTL) OPTIME
TOPICAL | Status: DC | PRN
  Administered 2017-05-26 (×2): 1000 mL

## 2017-05-26 MED ORDER — PANTOPRAZOLE SODIUM 40 MG IV SOLR
40.0000 mg | Freq: Every day | INTRAVENOUS | Status: DC
  Administered 2017-05-26 – 2017-05-28 (×3): 40 mg via INTRAVENOUS
  Filled 2017-05-26 (×3): qty 40

## 2017-05-26 MED ORDER — POVIDONE-IODINE 10 % EX OINT
TOPICAL_OINTMENT | CUTANEOUS | Status: AC
  Filled 2017-05-26: qty 28.35

## 2017-05-26 MED ORDER — SODIUM CHLORIDE 0.9% FLUSH: 9.0000 mL | INTRAVENOUS | Status: DC | PRN

## 2017-05-26 MED ORDER — DIPHENHYDRAMINE HCL 12.5 MG/5ML PO ELIX: 12.5000 mg | ORAL_SOLUTION | Freq: Four times a day (QID) | ORAL | Status: DC | PRN

## 2017-05-26 MED ORDER — LIDOCAINE IN D5W 4-5 MG/ML-% IV SOLN
INTRAVENOUS | Status: DC | PRN
  Administered 2017-05-26: 1.5 ug/kg/min via INTRAVENOUS

## 2017-05-26 MED ORDER — BUPIVACAINE-EPINEPHRINE (PF) 0.25% -1:200000 IJ SOLN
INTRAMUSCULAR | Status: AC
  Filled 2017-05-26: qty 30

## 2017-05-26 MED ORDER — ALVIMOPAN 12 MG PO CAPS
12.0000 mg | ORAL_CAPSULE | ORAL | Status: AC
  Administered 2017-05-26: 12 mg via ORAL
  Filled 2017-05-26: qty 1

## 2017-05-26 MED ORDER — ONDANSETRON HCL 4 MG/2ML IJ SOLN
INTRAMUSCULAR | Status: AC
  Filled 2017-05-26: qty 2

## 2017-05-26 MED ORDER — MIDAZOLAM HCL 2 MG/2ML IJ SOLN
INTRAMUSCULAR | Status: AC
  Filled 2017-05-26: qty 2

## 2017-05-26 MED ORDER — ONDANSETRON 4 MG PO TBDP: 4.0000 mg | ORAL_TABLET | Freq: Four times a day (QID) | ORAL | Status: DC | PRN

## 2017-05-26 MED ORDER — LIDOCAINE 2% (20 MG/ML) 5 ML SYRINGE
INTRAMUSCULAR | Status: AC
  Filled 2017-05-26: qty 5

## 2017-05-26 MED ORDER — PHENYLEPHRINE 40 MCG/ML (10ML) SYRINGE FOR IV PUSH (FOR BLOOD PRESSURE SUPPORT)
PREFILLED_SYRINGE | INTRAVENOUS | Status: DC | PRN
  Administered 2017-05-26 (×3): 80 ug via INTRAVENOUS

## 2017-05-26 MED ORDER — HEPARIN SODIUM (PORCINE) 5000 UNIT/ML IJ SOLN
5000.0000 [IU] | Freq: Three times a day (TID) | INTRAMUSCULAR | Status: DC
  Administered 2017-05-27 – 2017-06-03 (×22): 5000 [IU] via SUBCUTANEOUS
  Filled 2017-05-26 (×22): qty 1

## 2017-05-26 MED ORDER — ROCURONIUM BROMIDE 10 MG/ML (PF) SYRINGE
PREFILLED_SYRINGE | INTRAVENOUS | Status: AC
  Filled 2017-05-26: qty 5

## 2017-05-26 MED ORDER — ACETAMINOPHEN 500 MG PO TABS
1000.0000 mg | ORAL_TABLET | ORAL | Status: AC
  Administered 2017-05-26: 1000 mg via ORAL
  Filled 2017-05-26: qty 2

## 2017-05-26 MED ORDER — FENTANYL CITRATE (PF) 100 MCG/2ML IJ SOLN
INTRAMUSCULAR | Status: DC | PRN
  Administered 2017-05-26: 50 ug via INTRAVENOUS
  Administered 2017-05-26 (×2): 100 ug via INTRAVENOUS
  Administered 2017-05-26: 50 ug via INTRAVENOUS

## 2017-05-26 MED ORDER — PROPOFOL 10 MG/ML IV BOLUS
INTRAVENOUS | Status: DC | PRN
  Administered 2017-05-26: 200 mg via INTRAVENOUS

## 2017-05-26 MED ORDER — LIDOCAINE 2% (20 MG/ML) 5 ML SYRINGE
INTRAMUSCULAR | Status: DC | PRN
  Administered 2017-05-26: 60 mg via INTRAVENOUS

## 2017-05-26 MED ORDER — CELECOXIB 200 MG PO CAPS
200.0000 mg | ORAL_CAPSULE | ORAL | Status: AC
  Administered 2017-05-26: 200 mg via ORAL
  Filled 2017-05-26: qty 1

## 2017-05-26 MED ORDER — KETAMINE HCL 10 MG/ML IJ SOLN
INTRAMUSCULAR | Status: DC | PRN
  Administered 2017-05-26 (×2): 20 mg via INTRAVENOUS
  Administered 2017-05-26: 50 mg via INTRAVENOUS

## 2017-05-26 MED ORDER — EPHEDRINE 5 MG/ML INJ
INTRAVENOUS | Status: AC
  Filled 2017-05-26: qty 10

## 2017-05-26 MED ORDER — BUPIVACAINE-EPINEPHRINE 0.25% -1:200000 IJ SOLN
INTRAMUSCULAR | Status: DC | PRN
  Administered 2017-05-26: 30 mL

## 2017-05-26 MED ORDER — CIPROFLOXACIN IN D5W 400 MG/200ML IV SOLN
400.0000 mg | INTRAVENOUS | Status: AC
  Administered 2017-05-26: 400 mg via INTRAVENOUS
  Filled 2017-05-26: qty 200

## 2017-05-26 MED ORDER — HYDROMORPHONE HCL 1 MG/ML IJ SOLN
0.2500 mg | INTRAMUSCULAR | Status: DC | PRN
  Administered 2017-05-26 (×4): 0.5 mg via INTRAVENOUS

## 2017-05-26 MED ORDER — PROMETHAZINE HCL 25 MG/ML IJ SOLN
INTRAMUSCULAR | Status: AC
  Filled 2017-05-26: qty 1

## 2017-05-26 MED ORDER — NALOXONE HCL 0.4 MG/ML IJ SOLN: 0.4000 mg | INTRAMUSCULAR | Status: DC | PRN

## 2017-05-26 MED ORDER — STERILE WATER FOR IRRIGATION IR SOLN
Status: DC | PRN
  Administered 2017-05-26: 1000 mL

## 2017-05-26 MED ORDER — PROMETHAZINE HCL 25 MG/ML IJ SOLN
6.2500 mg | INTRAMUSCULAR | Status: DC | PRN
  Administered 2017-05-26: 6.25 mg via INTRAVENOUS

## 2017-05-26 MED ORDER — MIDAZOLAM HCL 5 MG/5ML IJ SOLN
INTRAMUSCULAR | Status: DC | PRN
  Administered 2017-05-26: 2 mg via INTRAVENOUS

## 2017-05-26 MED ORDER — DEXTROSE 5 % IV SOLN
INTRAVENOUS | Status: DC | PRN
  Administered 2017-05-26: 50 ug/min via INTRAVENOUS

## 2017-05-26 MED ORDER — GABAPENTIN 300 MG PO CAPS
300.0000 mg | ORAL_CAPSULE | ORAL | Status: AC
  Administered 2017-05-26: 300 mg via ORAL
  Filled 2017-05-26: qty 1

## 2017-05-26 MED ORDER — MEPERIDINE HCL 25 MG/ML IJ SOLN: 6.2500 mg | INTRAMUSCULAR | Status: DC | PRN

## 2017-05-26 MED ORDER — ROCURONIUM BROMIDE 100 MG/10ML IV SOLN
INTRAVENOUS | Status: DC | PRN
  Administered 2017-05-26: 20 mg via INTRAVENOUS
  Administered 2017-05-26: 60 mg via INTRAVENOUS
  Administered 2017-05-26: 20 mg via INTRAVENOUS

## 2017-05-26 SURGICAL SUPPLY — 83 items
APPLIER CLIP ROT 10 11.4 M/L (STAPLE)
APR CLP MED LRG 11.4X10 (STAPLE)
BLADE CLIPPER SURG (BLADE) ×3 IMPLANT
BLADE SURG 10 STRL SS (BLADE) ×3 IMPLANT
CANISTER SUCT 3000ML PPV (MISCELLANEOUS) ×3 IMPLANT
CELLS DAT CNTRL 66122 CELL SVR (MISCELLANEOUS) IMPLANT
CHLORAPREP W/TINT 26ML (MISCELLANEOUS) ×3 IMPLANT
CLIP APPLIE ROT 10 11.4 M/L (STAPLE) IMPLANT
CONT SPEC 4OZ CLIKSEAL STRL BL (MISCELLANEOUS) ×6 IMPLANT
COVER MAYO STAND STRL (DRAPES) ×6 IMPLANT
COVER SURGICAL LIGHT HANDLE (MISCELLANEOUS) ×6 IMPLANT
DRAPE HALF SHEET 40X57 (DRAPES) ×3 IMPLANT
DRAPE UTILITY XL STRL (DRAPES) ×3 IMPLANT
DRAPE WARM FLUID 44X44 (DRAPE) ×3 IMPLANT
DRSG OPSITE POSTOP 4X10 (GAUZE/BANDAGES/DRESSINGS) IMPLANT
DRSG OPSITE POSTOP 4X8 (GAUZE/BANDAGES/DRESSINGS) ×3 IMPLANT
DRSG TEGADERM 2-3/8X2-3/4 SM (GAUZE/BANDAGES/DRESSINGS) ×9 IMPLANT
ELECT BLADE 6.5 EXT (BLADE) ×3 IMPLANT
ELECT CAUTERY BLADE 6.4 (BLADE) ×6 IMPLANT
ELECT REM PT RETURN 9FT ADLT (ELECTROSURGICAL) ×3
ELECTRODE REM PT RTRN 9FT ADLT (ELECTROSURGICAL) ×2 IMPLANT
GAUZE SPONGE 2X2 8PLY STRL LF (GAUZE/BANDAGES/DRESSINGS) ×2 IMPLANT
GLOVE BIO SURGEON STRL SZ7 (GLOVE) ×6 IMPLANT
GLOVE BIO SURGEON STRL SZ7.5 (GLOVE) ×9 IMPLANT
GLOVE BIOGEL M STRL SZ7.5 (GLOVE) ×9 IMPLANT
GLOVE BIOGEL PI IND STRL 6.5 (GLOVE) ×2 IMPLANT
GLOVE BIOGEL PI IND STRL 7.0 (GLOVE) ×2 IMPLANT
GLOVE BIOGEL PI IND STRL 8 (GLOVE) ×10 IMPLANT
GLOVE BIOGEL PI INDICATOR 6.5 (GLOVE) ×1
GLOVE BIOGEL PI INDICATOR 7.0 (GLOVE) ×1
GLOVE BIOGEL PI INDICATOR 8 (GLOVE) ×5
GOWN STRL REIN 2XL LVL4 (GOWN DISPOSABLE) ×9 IMPLANT
GOWN STRL REUS W/ TWL LRG LVL3 (GOWN DISPOSABLE) ×6 IMPLANT
GOWN STRL REUS W/TWL LRG LVL3 (GOWN DISPOSABLE) ×3
KIT BASIN OR (CUSTOM PROCEDURE TRAY) ×3 IMPLANT
KIT ROOM TURNOVER OR (KITS) ×3 IMPLANT
LEGGING LITHOTOMY PAIR STRL (DRAPES) ×3 IMPLANT
LIGASURE IMPACT 36 18CM CVD LR (INSTRUMENTS) ×3 IMPLANT
NS IRRIG 1000ML POUR BTL (IV SOLUTION) ×6 IMPLANT
PAD ARMBOARD 7.5X6 YLW CONV (MISCELLANEOUS) ×6 IMPLANT
PENCIL BUTTON HOLSTER BLD 10FT (ELECTRODE) ×6 IMPLANT
RELOAD PROXIMATE 75MM BLUE (ENDOMECHANICALS) ×3 IMPLANT
RTRCTR WOUND ALEXIS 18CM MED (MISCELLANEOUS)
SCISSORS LAP 5X35 DISP (ENDOMECHANICALS) IMPLANT
SET IRRIG TUBING LAPAROSCOPIC (IRRIGATION / IRRIGATOR) IMPLANT
SHEARS HARMONIC ACE PLUS 36CM (ENDOMECHANICALS) ×3 IMPLANT
SLEEVE ENDOPATH XCEL 5M (ENDOMECHANICALS) ×6 IMPLANT
SPECIMEN JAR LARGE (MISCELLANEOUS) ×3 IMPLANT
SPONGE GAUZE 2X2 STER 10/PKG (GAUZE/BANDAGES/DRESSINGS) ×1
SPONGE LAP 18X18 X RAY DECT (DISPOSABLE) ×3 IMPLANT
STAPLER CUT CVD 40MM BLUE (STAPLE) ×6 IMPLANT
STAPLER PROXIMATE 75MM BLUE (STAPLE) ×3 IMPLANT
STAPLER VISISTAT 35W (STAPLE) ×3 IMPLANT
SUCTION POOLE TIP (SUCTIONS) ×3 IMPLANT
SURGILUBE 2OZ TUBE FLIPTOP (MISCELLANEOUS) ×3 IMPLANT
SUT PDS AB 1 CT  36 (SUTURE)
SUT PDS AB 1 CT 36 (SUTURE) IMPLANT
SUT PDS AB 1 TP1 96 (SUTURE) ×6 IMPLANT
SUT PROLENE 2 0 CT2 30 (SUTURE) IMPLANT
SUT PROLENE 2 0 KS (SUTURE) IMPLANT
SUT PROLENE 2 0 SH 30 (SUTURE) ×3 IMPLANT
SUT SILK 2 0 (SUTURE) ×1
SUT SILK 2 0 SH CR/8 (SUTURE) ×3 IMPLANT
SUT SILK 2-0 18XBRD TIE 12 (SUTURE) ×2 IMPLANT
SUT SILK 3 0 (SUTURE) ×1
SUT SILK 3 0 SH CR/8 (SUTURE) ×3 IMPLANT
SUT SILK 3-0 18XBRD TIE 12 (SUTURE) ×2 IMPLANT
SYR BULB IRRIGATION 50ML (SYRINGE) ×6 IMPLANT
SYS LAPSCP GELPORT 120MM (MISCELLANEOUS) ×3
SYSTEM LAPSCP GELPORT 120MM (MISCELLANEOUS) ×2 IMPLANT
TOWEL OR 17X26 10 PK STRL BLUE (TOWEL DISPOSABLE) ×3 IMPLANT
TRAY FOLEY W/METER SILVER 16FR (SET/KITS/TRAYS/PACK) ×3 IMPLANT
TRAY LAPAROSCOPIC MC (CUSTOM PROCEDURE TRAY) ×3 IMPLANT
TRAY PROCTOSCOPIC FIBER OPTIC (SET/KITS/TRAYS/PACK) ×3 IMPLANT
TROCAR XCEL 12X100 BLDLESS (ENDOMECHANICALS) IMPLANT
TROCAR XCEL BLUNT TIP 100MML (ENDOMECHANICALS) IMPLANT
TROCAR XCEL NON-BLD 11X100MML (ENDOMECHANICALS) IMPLANT
TROCAR XCEL NON-BLD 5MMX100MML (ENDOMECHANICALS) ×3 IMPLANT
TUBE CONNECTING 12X1/4 (SUCTIONS) ×6 IMPLANT
TUBE CONNECTING 20X1/4 (TUBING) ×3 IMPLANT
TUBING INSUFFLATION (TUBING) ×3 IMPLANT
WATER STERILE IRR 1000ML POUR (IV SOLUTION) ×3 IMPLANT
YANKAUER SUCT BULB TIP NO VENT (SUCTIONS) ×6 IMPLANT

## 2017-05-26 NOTE — Interval H&P Note (Signed)
History and Physical Interval Note:  05/26/2017 8:42 AM  Anthony Knight  has presented today for surgery, with the diagnosis of sigmoid colon stricture  The various methods of treatment have been discussed with the patient and family. After consideration of risks, benefits and other options for treatment, the patient has consented to  Procedure(s) with comments: [place] (N/A) - [place] as a surgical intervention .  The patient's history has been reviewed, patient examined, no change in status, stable for surgery.  I have reviewed the patient's chart and labs.  Questions were answered to the patient's satisfaction.     TOTH III,Lynden Flemmer S

## 2017-05-26 NOTE — H&P (Signed)
Anthony Knight  Location: [facility] Surgery Patient #: [id number] DOB: May 28, 1960 Married / Language: English / Race: White Male   History of Present Illness  The patient is a 57 year old male who presents for a follow-up for Abdominal pain. The patient is a 56 year old white male who we saw a couple weeks ago with left lower quadrant pain and a history of diverticulitis. We treated him empirically with some Augmentin and Flagyl. He has had no improvement in his symptoms. He also underwent a CT scan that did not show any significant inflammatory change around the sigmoid colon but did show some chronic thickening of the wall of the sigmoid colon. He also reports a significant history of colon cancer in quite a few members of his family.   Allergies  No Known Drug Allergies  Allergies Reconciled   Medication History Amoxicillin-Pot Clavulanate (875-125MG  Tablet, 1 (one) Oral two times daily, Taken starting 04/24/2017) Active. Flagyl (500MG  Tablet, 1 (one) Oral three times daily, Taken starting 04/24/2017) Active. Losartan Potassium (100MG  Tablet, Oral) Active. Medications Reconciled    Review of Systems  General Present- Fatigue. Not Present- Appetite Loss, Chills, Fever, Night Sweats, Weight Gain and Weight Loss. Skin Not Present- Change in Wart/Mole, Dryness, Hives, Jaundice, New Lesions, Non-Healing Wounds, Rash and Ulcer. HEENT Present- Ringing in the Ears. Not Present- Earache, Hearing Loss, Hoarseness, Nose Bleed, Oral Ulcers, Seasonal Allergies, Sinus Pain, Sore Throat, Visual Disturbances, Wears glasses/contact lenses and Yellow Eyes. Respiratory Present- Snoring. Not Present- Bloody sputum, Chronic Cough, Difficulty Breathing and Wheezing. Breast Not Present- Breast Mass, Breast Pain, Nipple Discharge and Skin Changes. Cardiovascular Not Present- Chest Pain, Difficulty Breathing Lying Down, Leg Cramps, Palpitations, Rapid Heart Rate, Shortness of Breath and Swelling  of Extremities. Gastrointestinal Present- Abdominal Pain, Bloody Stool and Change in Bowel Habits. Not Present- Bloating, Chronic diarrhea, Constipation, Difficulty Swallowing, Excessive gas, Gets full quickly at meals, Hemorrhoids, Indigestion, Nausea, Rectal Pain and Vomiting. Male Genitourinary Not Present- Blood in Urine, Change in Urinary Stream, Frequency, Impotence, Nocturia, Painful Urination, Urgency and Urine Leakage.  Vitals  Weight: 218.5 lb Height: 72in Body Surface Area: 2.21 m Body Mass Index: 29.63 kg/m  Temp.: 97.45F  Pulse: 80 (Regular)  BP: 126/84 (Sitting, Left Arm, Standard)       Physical Exam  General Mental Status-Alert. General Appearance-Consistent with stated age. Hydration-Well hydrated. Voice-Normal.  Head and Neck Head-normocephalic, atraumatic with no lesions or palpable masses. Trachea-midline. Thyroid Gland Characteristics - normal size and consistency.  Eye Eyeball - Bilateral-Extraocular movements intact. Sclera/Conjunctiva - Bilateral-No scleral icterus.  Chest and Lung Exam Chest and lung exam reveals -quiet, even and easy respiratory effort with no use of accessory muscles and on auscultation, normal breath sounds, no adventitious sounds and normal vocal resonance. Inspection Chest Wall - Normal. Back - normal.  Cardiovascular Cardiovascular examination reveals -normal heart sounds, regular rate and rhythm with no murmurs and normal pedal pulses bilaterally.  Abdomen Note: the abdomen is soft with moderate focal tenderness in the left lower quadrant but no guarding or peritonitis. There is no palpable mass. There are no surgical scars.   Neurologic Neurologic evaluation reveals -alert and oriented x 3 with no impairment of recent or remote memory. Mental Status-Normal.  Musculoskeletal Normal Exam - Left-Upper Extremity Strength Normal and Lower Extremity Strength Normal. Normal Exam -  Right-Upper Extremity Strength Normal and Lower Extremity Strength Normal.  Lymphatic Head & Neck  General Head & Neck Lymphatics: Bilateral - Description - Normal. Axillary  General Axillary  Region: Bilateral - Description - Normal. Tenderness - Non Tender. Femoral & Inguinal  Generalized Femoral & Inguinal Lymphatics: Bilateral - Description - Normal. Tenderness - Non Tender.    Assessment & Plan  DIVERTICULITIS OF SIGMOID COLON (K57.32) Impression: The patient appears to have some chronic thickening of the sigmoid colon. It is difficult to tell if this is burned out diverticulitis or if there could be an underlying mass. Because of this I would like him to be evaluated by our colorectal surgeon with a colonoscopy as soon as possible. Either way think he is going to require a sigmoid colectomy. I have discussed with him in detail the risks and benefits of the operation as well as some of the technical aspects and he understands and wishes to proceed

## 2017-05-26 NOTE — Anesthesia Preprocedure Evaluation (Signed)
Anesthesia Evaluation  Patient identified by MRN, date of birth, ID band Patient awake    Reviewed: Allergy & Precautions, NPO status , Patient's Chart, lab work & pertinent test results  Airway Mallampati: II  TM Distance: >3 FB Neck ROM: Full    Dental no notable dental hx. (+) Teeth Intact   Pulmonary former smoker,    Pulmonary exam normal breath sounds clear to auscultation       Cardiovascular hypertension, Pt. on medications Normal cardiovascular exam Rhythm:Regular Rate:Normal     Neuro/Psych  Headaches, negative psych ROS   GI/Hepatic Sigmoid colon stricture   Endo/Other  negative endocrine ROS  Renal/GU Renal diseaseHx/o renal calculi  negative genitourinary   Musculoskeletal negative musculoskeletal ROS (+)   Abdominal   Peds  Hematology negative hematology ROS (+)   Anesthesia Other Findings   Reproductive/Obstetrics                             Anesthesia Physical Anesthesia Plan  ASA: II  Anesthesia Plan: General   Post-op Pain Management:    Induction: Intravenous  PONV Risk Score and Plan: 3 and Ondansetron, Dexamethasone, Midazolam and Treatment may vary due to age or medical condition  Airway Management Planned: Oral ETT  Additional Equipment:   Intra-op Plan:   Post-operative Plan: Extubation in OR  Informed Consent: I have reviewed the patients History and Physical, chart, labs and discussed the procedure including the risks, benefits and alternatives for the proposed anesthesia with the patient or authorized representative who has indicated his/her understanding and acceptance.   Dental advisory given  Plan Discussed with: CRNA, Anesthesiologist and Surgeon  Anesthesia Plan Comments:         Anesthesia Quick Evaluation

## 2017-05-26 NOTE — Anesthesia Postprocedure Evaluation (Signed)
Anesthesia Post Note  Patient: Anthony Knight  Procedure(s) Performed: LAPAROSCOPIC ASSISTED SIGMOID COLECTOMY (N/A Abdomen) RIGID SIGMOIDOSCOPY (N/A Rectum)     Patient location during evaluation: PACU Anesthesia Type: General Level of consciousness: awake and alert Pain management: pain level controlled Vital Signs Assessment: post-procedure vital signs reviewed and stable Respiratory status: spontaneous breathing, nonlabored ventilation and respiratory function stable Cardiovascular status: blood pressure returned to baseline and stable Postop Assessment: no apparent nausea or vomiting Anesthetic complications: no    Last Vitals:  Vitals:   05/26/17 1315 05/26/17 1320  BP:  120/77  Pulse: 82 76  Resp: 14 13  Temp:    SpO2: 96% 93%    Last Pain:  Vitals:   05/26/17 1320  TempSrc:   PainSc: Asleep                 Lynda Rainwater

## 2017-05-26 NOTE — Transfer of Care (Signed)
Immediate Anesthesia Transfer of Care Note  Patient: Anthony Knight  Procedure(s) Performed: LAPAROSCOPIC ASSISTED SIGMOID COLECTOMY (N/A Abdomen) RIGID SIGMOIDOSCOPY (N/A Rectum)  Patient Location: PACU  Anesthesia Type:General  Level of Consciousness: sedated  Airway & Oxygen Therapy: Patient Spontanous Breathing and Patient connected to face mask oxygen  Post-op Assessment: Report given to RN and Post -op Vital signs reviewed and stable  Post vital signs: Reviewed and stable  Last Vitals:  Vitals:   05/26/17 0800  BP: 132/83  Pulse: 83  Resp: 17  Temp: 36.6 C  SpO2: 98%    Last Pain:  Vitals:   05/26/17 0800  TempSrc: Oral  PainSc: 3       Patients Stated Pain Goal: 7 (60/73/71 0626)  Complications: No apparent anesthesia complications

## 2017-05-26 NOTE — Anesthesia Procedure Notes (Signed)
Procedure Name: Intubation Date/Time: 05/26/2017 9:23 AM Performed by: Lieutenant Diego, CRNA Pre-anesthesia Checklist: Patient identified, Emergency Drugs available, Suction available and Patient being monitored Patient Re-evaluated:Patient Re-evaluated prior to induction Oxygen Delivery Method: Circle system utilized Preoxygenation: Pre-oxygenation with 100% oxygen Induction Type: IV induction Ventilation: Mask ventilation without difficulty Laryngoscope Size: Miller and 2 Grade View: Grade I Tube type: Oral Tube size: 7.5 mm Number of attempts: 1 Airway Equipment and Method: Stylet and Oral airway Placement Confirmation: ETT inserted through vocal cords under direct vision,  positive ETCO2 and breath sounds checked- equal and bilateral Secured at: 23 cm Tube secured with: Tape Dental Injury: Teeth and Oropharynx as per pre-operative assessment

## 2017-05-26 NOTE — Op Note (Addendum)
05/26/2017  12:13 PM  PATIENT:  Anthony Knight  57 y.o. male  PRE-OPERATIVE DIAGNOSIS:  sigmoid colon stricture  POST-OPERATIVE DIAGNOSIS:  sigmoid colon stricture  PROCEDURE:  Procedure(s) with comments: LAPAROSCOPIC ASSISTED SIGMOID COLECTOMY (N/A) - ERAS PATHWAY RIGID SIGMOIDOSCOPY (N/A)  SURGEON:  Surgeon(s) and Role:    * Jovita Kussmaul, MD - Primary    * Greer Pickerel, MD - Assisting  PHYSICIAN ASSISTANT:   ASSISTANTS: Dr. Redmond Pulling   ANESTHESIA:   general  EBL:  25 mL   BLOOD ADMINISTERED:none  DRAINS: none   LOCAL MEDICATIONS USED:  MARCAINE     SPECIMEN:  Source of Specimen:  sigmoid colon with donut margins  DISPOSITION OF SPECIMEN:  PATHOLOGY  COUNTS:  YES  TOURNIQUET:  * No tourniquets in log *  DICTATION: .Dragon Dictation   After informed consent was obtained the patient was brought to the operating room and placed in the supine position on the operating table.  After adequate induction of general anesthesia the patient was moved in the lithotomy position and all pressure points were padded.  The abdomen was then prepped with ChloraPrep, allowed to dry, and draped in usual sterile manner and the peritoneum was prepped with Betadine and also draped in the usual sterile manner.  An appropriate timeout was performed.  A rigid sigmoidoscopy was initially performed since he refused preoperative colonoscopy.  At about 20 cm I was unable to visual in question.  Next we chose to access the abdominal cavity using a 5 mm Optiview port through a small stab incision in the right upper quadrant.  Using the camera and Optiview port we were able to bluntly dissected the layers of the abdominal wall under direct vision until access was gained to the abdominal cavity.  The abdomen was insufflated with carbon dioxide without difficulty.  2 other 5 mm ports were placed along the right lower side of the abdominal cavity.  We were able to then mobilize the sigmoid and left colon by  incising its retroperitoneal attachment along the white line of Toldt.  We were able to mobilize the splenic flexure doing this as well.  The left and sigmoid colon then seem to be very mobile.  The area in question along the sigmoid colon looked very masslike.  The rest of the abdomen was inspected in all 4 quadrants and no other abnormalities were noted.  At this point a lower midline incision was made with a 10 blade knife.  The incision was carried through the skin and subcutaneous tissue sharply with electrocautery.  The peritoneum was then opened sharply with the electrocautery and the rest of the incision was opened under direct vision.  A wound protector was deployed.  The area in question was able to be brought up into the wound easily.  We chose a site well above and below the area of abnormality.  The mesentery to the sites was opened sharply with the electrocautery.  The proximal area was then divided with a firing of a GIA-75 stapler.  The distal area was then divided with a single firing of a contour blue load stapler.  The mesentery to the sigmoid colon was then taken down sharply with the LigaSure.  The main vessel and the mesentery was clamped with a Kelly clamp, divided with the LigaSure and ligated with a 2-0 silk suture ligature.  The ureter appeared well below where we were dissecting.  The sigmoid colon was then sent to pathology for further evaluation.  It did appear as though there was a mass causing the narrowing of the sigmoid colon.  The proximal and distal segments reached each other easily.  At this point the proximal staple line was excised sharply with electrocautery.  We chose a 29 mm EEA stapler.  A 2-0 Prolene pursestring stitch was placed around the edge of the proximal segment.  The anvil was placed in the lumen of the colon and the pursestring stitch was cinched down around the neck of it.  Next Dr. Redmond Pulling went below and was able to bring the EEA stapler up to the rectal stump.   The spike was deployed anterior to the staple line.  The anvil and spike were connected and the stapling device was closed.  A minute was allowed to pass.  The staple device was then fired thereby creating a nice widely patent enteroenterostomy.  The anastomosis was reinforced with several 2-0 silk stitches.  The anastomosis was patent and healthy appearing with no tension.  The colon above the anastomosis was clamped with a soft bowel clamp.  The pelvis was filled with saline.  Dr. Redmond Pulling then performed another rigid sigmoidoscopy and with insufflation of the rectal stump there was no air bubbles or evidence of leak.  During this portion of the procedure we did identify a 4-5 mm pedunculated polyp on the posterior wall of the rectum at 18 cm.  This was very small and well away from the mass.  At this point we decided not to perform a low anterior resection but he will need a colonoscopy to evaluate the rest of his colon at about 6 weeks from the time of the surgery.  The abdomen was then irrigated with copious amounts of saline and sterile water.  The liver was palpated and I could not feel any abnormalities of the liver.  Next gowns and gloves were changed and new drapes were applied.  The fascia of the anterior abdominal wall was then closed with 2 running #1 double-stranded looped PDS sutures.  The subcutaneous tissue was irrigated with copious amounts of saline.  The skin incisions were closed with staples and sterile dressings were applied.  The patient tolerated the procedure well.  At the end of the case all needle sponge and instrument counts were correct.  The patient was then awakened and taken to recovery in stable condition.  The assistant was instrumental in retraction and visualization during the entire case.  PLAN OF CARE: Admit to inpatient   PATIENT DISPOSITION:  PACU - hemodynamically stable.   Delay start of Pharmacological VTE agent (>24hrs) due to surgical blood loss or risk of bleeding:  no

## 2017-05-26 NOTE — Progress Notes (Signed)
Patient arrived to 6n32 alert and oriented, VSS, IV fluids, pt on 3L o2. Pain moderate, will medicate per orders. Patient noted to have one midline incision on abdomen with honeycomb dressing and 3 lap sites with gauze all clean dry and intact. Oriented to room and staff, family at bedside, will continue to monitor.

## 2017-05-27 ENCOUNTER — Encounter (HOSPITAL_COMMUNITY): Payer: Self-pay | Admitting: General Surgery

## 2017-05-27 LAB — CBC
HCT: 39.9 % (ref 39.0–52.0)
Hemoglobin: 13 g/dL (ref 13.0–17.0)
MCH: 29.4 pg (ref 26.0–34.0)
MCHC: 32.6 g/dL (ref 30.0–36.0)
MCV: 90.3 fL (ref 78.0–100.0)
Platelets: 285 10*3/uL (ref 150–400)
RBC: 4.42 MIL/uL (ref 4.22–5.81)
RDW: 14.1 % (ref 11.5–15.5)
WBC: 18.4 10*3/uL — ABNORMAL HIGH (ref 4.0–10.5)

## 2017-05-27 LAB — BASIC METABOLIC PANEL
ANION GAP: 10 (ref 5–15)
BUN: 12 mg/dL (ref 6–20)
CALCIUM: 8.6 mg/dL — AB (ref 8.9–10.3)
CO2: 23 mmol/L (ref 22–32)
Chloride: 104 mmol/L (ref 101–111)
Creatinine, Ser: 1.18 mg/dL (ref 0.61–1.24)
GFR calc Af Amer: >60 mL/min (ref >60)
GLUCOSE: 156 mg/dL — AB (ref 65–99)
Potassium: 4.4 mmol/L (ref 3.5–5.1)
Sodium: 137 mmol/L (ref 135–145)

## 2017-05-27 LAB — CEA: CEA1: 11.4 ng/mL — AB (ref 0.0–4.7)

## 2017-05-27 MED ORDER — KETOROLAC TROMETHAMINE 30 MG/ML IJ SOLN
30.0000 mg | Freq: Three times a day (TID) | INTRAMUSCULAR | Status: AC | PRN
  Administered 2017-05-27 – 2017-05-31 (×8): 30 mg via INTRAVENOUS
  Filled 2017-05-27 (×9): qty 1

## 2017-05-27 NOTE — Progress Notes (Signed)
1 Day Post-Op  Subjective: Stable and alert.  Having incisional pain but otherwise doing well Foley catheter just removed. No stool or flatus.  Denies nausea.  No respiratory problems. Afebrile.  Heart rate 76.  Normotensive. WBC 18,400.  Hemoglobin 13.0.  Potassium 4.4.  Creatinine 1.18.   Objective: Vital signs in last 24 hours: Temp:  [97.7 F (36.5 C)-98.6 F (37 C)] 97.9 F (36.6 C) (01/05 0626) Pulse Rate:  [76-97] 76 (01/05 0626) Resp:  [11-24] 24 (01/05 0801) BP: (105-130)/(58-83) 114/66 (01/05 0626) SpO2:  [93 %-100 %] 97 % (01/05 0801) Last BM Date: 05/25/17  Intake/Output from previous day: 01/04 0701 - 01/05 0700 In: 2770 [P.O.:360; I.V.:2410] Out: 2105 [Urine:2080; Blood:25] Intake/Output this shift: No intake/output data recorded.  General appearance: Alert.  Mild distress incisional pain.  Wife is present.  Mental status normal. Resp: clear to auscultation bilaterally GI: Soft.  Wounds look clean.  Slightly protuberant.  Bowel sounds present.  Lab Results:  Results for orders placed or performed during the hospital encounter of 05/26/17 (from the past 24 hour(s))  CEA     Status: Abnormal   Collection Time: 05/26/17  1:23 PM  Result Value Ref Range   CEA 11.4 (H) 0.0 - 4.7 ng/mL  Basic metabolic panel     Status: Abnormal   Collection Time: 05/27/17  4:32 AM  Result Value Ref Range   Sodium 137 135 - 145 mmol/L   Potassium 4.4 3.5 - 5.1 mmol/L   Chloride 104 101 - 111 mmol/L   CO2 23 22 - 32 mmol/L   Glucose, Bld 156 (H) 65 - 99 mg/dL   BUN 12 6 - 20 mg/dL   Creatinine, Ser 1.18 0.61 - 1.24 mg/dL   Calcium 8.6 (L) 8.9 - 10.3 mg/dL   GFR calc non Af Amer >60 >60 mL/min   GFR calc Af Amer >60 >60 mL/min   Anion gap 10 5 - 15  CBC     Status: Abnormal   Collection Time: 05/27/17  4:32 AM  Result Value Ref Range   WBC 18.4 (H) 4.0 - 10.5 K/uL   RBC 4.42 4.22 - 5.81 MIL/uL   Hemoglobin 13.0 13.0 - 17.0 g/dL   HCT 39.9 39.0 - 52.0 %   MCV 90.3 78.0  - 100.0 fL   MCH 29.4 26.0 - 34.0 pg   MCHC 32.6 30.0 - 36.0 g/dL   RDW 14.1 11.5 - 15.5 %   Platelets 285 150 - 400 K/uL     Studies/Results: No results found.  . heparin  5,000 Units Subcutaneous Q8H  . losartan  100 mg Oral Daily  . morphine   Intravenous Q4H  . pantoprazole (PROTONIX) IV  40 mg Intravenous QHS     Assessment/Plan: s/p Procedure(s): LAPAROSCOPIC ASSISTED SIGMOID COLECTOMY RIGID SIGMOIDOSCOPY  POD #1 -laparoscopic assisted sigmoid colectomy Stable Ileus expected Clear liquids entereg Mobilized Voiding trial  @PROBHOSP @  LOS: 1 day    Kasiyah Platter M Madalee Altmann 05/27/2017  . .prob

## 2017-05-27 NOTE — Progress Notes (Signed)
Better pain relief with the addition of Toradol.  Pt up and ambulating in the halls, min assist. Tolerated well.  Using IS (1750).  Wife at bedside.  Tolerating clear liquids without nausea at present.  Pt has voided post foley catheter removal.

## 2017-05-28 ENCOUNTER — Inpatient Hospital Stay (HOSPITAL_COMMUNITY): Payer: BLUE CROSS/BLUE SHIELD

## 2017-05-28 LAB — CBC
HCT: 41.3 % (ref 39.0–52.0)
Hemoglobin: 12.8 g/dL — ABNORMAL LOW (ref 13.0–17.0)
MCH: 28.4 pg (ref 26.0–34.0)
MCHC: 31 g/dL (ref 30.0–36.0)
MCV: 91.6 fL (ref 78.0–100.0)
PLATELETS: 267 10*3/uL (ref 150–400)
RBC: 4.51 MIL/uL (ref 4.22–5.81)
RDW: 14.3 % (ref 11.5–15.5)
WBC: 15.5 10*3/uL — AB (ref 4.0–10.5)

## 2017-05-28 MED ORDER — ACETAMINOPHEN 325 MG PO TABS
650.0000 mg | ORAL_TABLET | Freq: Four times a day (QID) | ORAL | Status: DC | PRN
  Administered 2017-05-28 – 2017-05-30 (×5): 650 mg via ORAL
  Filled 2017-05-28 (×6): qty 2

## 2017-05-28 MED ORDER — PROMETHAZINE HCL 25 MG/ML IJ SOLN
12.5000 mg | Freq: Four times a day (QID) | INTRAMUSCULAR | Status: DC | PRN
  Administered 2017-05-28: 12.5 mg via INTRAVENOUS
  Filled 2017-05-28: qty 1

## 2017-05-28 NOTE — Progress Notes (Signed)
2 Days Post-Op  Subjective: Has had 2 loose stools and passing flatus  voiding without difficulty Addition of Toradol has helped the pain somewhat, however still having significant pain from intermittent cramps Some nausea but no vomiting Fever to 102  WBC down 15,000.  Was 18,000 yesterday  Objective: Vital signs in last 24 hours: Temp:  [97.8 F (36.6 C)-102.5 F (39.2 C)] 102.5 F (39.2 C) (01/06 0254) Pulse Rate:  [70-105] 105 (01/06 0254) Resp:  [18-31] 26 (01/06 0537) BP: (107-125)/(63-77) 125/77 (01/06 0254) SpO2:  [90 %-98 %] 96 % (01/06 0537) Last BM Date: 05/25/17  Intake/Output from previous day: 01/05 0701 - 01/06 0700 In: 3522.8 [P.O.:720; I.V.:2802.8] Out: 600 [Urine:600] Intake/Output this shift: Total I/O In: 1497.4 [P.O.:120; I.V.:1377.4] Out: 0   General appearance: Alert and cooperative.  Mental status normal.  Mild to moderate distress from pain Resp: Clear anteriorly but decreased breath sounds at bases.  Left worse than right.  Egophony left base.  No rhonchi or wheeze. GI: Soft but with some tenderness and mild guarding in lower abdomen.  Active bowel sounds.  Wounds look fine. Extremities: extremities normal, atraumatic, no cyanosis or edema  Lab Results:  Results for orders placed or performed during the hospital encounter of 05/26/17 (from the past 24 hour(s))  CBC     Status: Abnormal   Collection Time: 05/28/17  4:32 AM  Result Value Ref Range   WBC 15.5 (H) 4.0 - 10.5 K/uL   RBC 4.51 4.22 - 5.81 MIL/uL   Hemoglobin 12.8 (L) 13.0 - 17.0 g/dL   HCT 41.3 39.0 - 52.0 %   MCV 91.6 78.0 - 100.0 fL   MCH 28.4 26.0 - 34.0 pg   MCHC 31.0 30.0 - 36.0 g/dL   RDW 14.3 11.5 - 15.5 %   Platelets 267 150 - 400 K/uL     Studies/Results: No results found.  . heparin  5,000 Units Subcutaneous Q8H  . losartan  100 mg Oral Daily  . morphine   Intravenous Q4H  . pantoprazole (PROTONIX) IV  40 mg Intravenous QHS     Assessment/Plan: s/p  Procedure(s): LAPAROSCOPIC ASSISTED SIGMOID COLECTOMY RIGID SIGMOIDOSCOPY  POD #2 -laparoscopic assisted sigmoid colectomy Stable but still has significant crampy pain and fever Suspect atelectasis based on exam Continue incentive spirometry, ambulation Check chest x-ray Repeat lab work tomorrow  Do not advance diet.  Avoid carbonated beverages     @PROBHOSP @  LOS: 2 days    Adin Hector 05/28/2017  . .prob

## 2017-05-29 LAB — CBC WITH DIFFERENTIAL/PLATELET
Basophils Absolute: 0 10*3/uL (ref 0.0–0.1)
Basophils Relative: 0 %
EOS PCT: 1 %
Eosinophils Absolute: 0.2 10*3/uL (ref 0.0–0.7)
HEMATOCRIT: 38.3 % — AB (ref 39.0–52.0)
Hemoglobin: 12.4 g/dL — ABNORMAL LOW (ref 13.0–17.0)
LYMPHS ABS: 1.6 10*3/uL (ref 0.7–4.0)
LYMPHS PCT: 10 %
MCH: 29.5 pg (ref 26.0–34.0)
MCHC: 32.4 g/dL (ref 30.0–36.0)
MCV: 91 fL (ref 78.0–100.0)
MONO ABS: 1 10*3/uL (ref 0.1–1.0)
Monocytes Relative: 6 %
NEUTROS ABS: 13.8 10*3/uL — AB (ref 1.7–7.7)
Neutrophils Relative %: 83 %
Platelets: 287 10*3/uL (ref 150–400)
RBC: 4.21 MIL/uL — ABNORMAL LOW (ref 4.22–5.81)
RDW: 13.8 % (ref 11.5–15.5)
WBC: 16.6 10*3/uL — ABNORMAL HIGH (ref 4.0–10.5)

## 2017-05-29 LAB — CBC
HEMATOCRIT: 36.4 % — AB (ref 39.0–52.0)
Hemoglobin: 11.6 g/dL — ABNORMAL LOW (ref 13.0–17.0)
MCH: 29.1 pg (ref 26.0–34.0)
MCHC: 31.9 g/dL (ref 30.0–36.0)
MCV: 91.5 fL (ref 78.0–100.0)
Platelets: 244 10*3/uL (ref 150–400)
RBC: 3.98 MIL/uL — AB (ref 4.22–5.81)
RDW: 13.9 % (ref 11.5–15.5)
WBC: 15.3 10*3/uL — AB (ref 4.0–10.5)

## 2017-05-29 LAB — BASIC METABOLIC PANEL
ANION GAP: 7 (ref 5–15)
ANION GAP: 4 — AB (ref 5–15)
BUN: 12 mg/dL (ref 6–20)
BUN: 13 mg/dL (ref 6–20)
CALCIUM: 8.5 mg/dL — AB (ref 8.9–10.3)
CO2: 23 mmol/L (ref 22–32)
CO2: 25 mmol/L (ref 22–32)
CREATININE: 1.26 mg/dL — AB (ref 0.61–1.24)
Calcium: 8.4 mg/dL — ABNORMAL LOW (ref 8.9–10.3)
Chloride: 108 mmol/L (ref 101–111)
Chloride: 109 mmol/L (ref 101–111)
Creatinine, Ser: 1.22 mg/dL (ref 0.61–1.24)
GFR calc Af Amer: >60 mL/min (ref >60)
GFR calc Af Amer: >60 mL/min (ref >60)
GFR calc non Af Amer: >60 mL/min (ref >60)
GFR calc non Af Amer: >60 mL/min (ref >60)
GLUCOSE: 144 mg/dL — AB (ref 65–99)
GLUCOSE: 132 mg/dL — AB (ref 65–99)
POTASSIUM: 3.7 mmol/L (ref 3.5–5.1)
Potassium: 3.7 mmol/L (ref 3.5–5.1)
Sodium: 138 mmol/L (ref 135–145)
Sodium: 138 mmol/L (ref 135–145)

## 2017-05-29 MED ORDER — PANTOPRAZOLE SODIUM 40 MG PO TBEC
40.0000 mg | DELAYED_RELEASE_TABLET | Freq: Every day | ORAL | Status: DC
  Administered 2017-05-29 – 2017-06-02 (×5): 40 mg via ORAL
  Filled 2017-05-29 (×5): qty 1

## 2017-05-29 NOTE — Progress Notes (Signed)
3 Days Post-Op   Subjective/Chief Complaint: Had lots of pain over weekend but feels better today. Having bm's   Objective: Vital signs in last 24 hours: Temp:  [98.4 F (36.9 C)-103 F (39.4 C)] 103 F (39.4 C) (01/07 0553) Pulse Rate:  [70-101] 101 (01/07 0416) Resp:  [18-25] 25 (01/07 0735) BP: (108-152)/(60-83) 135/73 (01/07 0416) SpO2:  [93 %-98 %] 94 % (01/07 0735) Last BM Date: 05/28/17  Intake/Output from previous day: 01/06 0701 - 01/07 0700 In: 280 [P.O.:280] Out: -  Intake/Output this shift: No intake/output data recorded.  General appearance: alert and cooperative Resp: clear to auscultation bilaterally Cardio: regular rate and rhythm GI: soft, moderate tenderness. good bs. incisions look good  Lab Results:  Recent Labs    05/27/17 0432 05/28/17 0432  WBC 18.4* 15.5*  HGB 13.0 12.8*  HCT 39.9 41.3  PLT 285 267   BMET Recent Labs    05/27/17 0432  NA 137  K 4.4  CL 104  CO2 23  GLUCOSE 156*  BUN 12  CREATININE 1.18  CALCIUM 8.6*   PT/INR No results for input(s): LABPROT, INR in the last 72 hours. ABG No results for input(s): PHART, HCO3 in the last 72 hours.  Invalid input(s): PCO2, PO2  Studies/Results: Dg Chest 2 View  Result Date: 05/28/2017 CLINICAL DATA:  Fever.  Two days postop colon resection. EXAM: CHEST  2 VIEW COMPARISON:  None. FINDINGS: This is a mildly low volume film with bibasilar atelectasis, left-greater-than-right. The visualized cardiomediastinal silhouette is unremarkable. No definite pleural effusion or pneumothorax. IMPRESSION: Mildly low volume film with bibasilar atelectasis, left-greater-than-right. Electronically Signed   By: Margarette Canada M.D.   On: 05/28/2017 13:15    Anti-infectives: Anti-infectives (From admission, onward)   Start     Dose/Rate Route Frequency Ordered Stop   05/26/17 0745  ciprofloxacin (CIPRO) IVPB 400 mg     400 mg 200 mL/hr over 60 Minutes Intravenous On call to O.R. 05/26/17 0746 05/26/17  1000   05/26/17 0745  metroNIDAZOLE (FLAGYL) IVPB 500 mg     500 mg 100 mL/hr over 60 Minutes Intravenous On call to O.R. 05/26/17 0746 05/26/17 1030      Assessment/Plan: s/p Procedure(s) with comments: LAPAROSCOPIC ASSISTED SIGMOID COLECTOMY (N/A) - ERAS PATHWAY RIGID SIGMOIDOSCOPY (N/A) Advance diet. Start fulls today Check wbc again today given fevers. abd seems appropriate Ambulate Await path  LOS: 3 days    TOTH III,PAUL S 05/29/2017

## 2017-05-29 NOTE — Progress Notes (Signed)
Pt noted with temp-103. PRN Tylenol given. On call Dr Ninfa Linden notified via text page.

## 2017-05-30 ENCOUNTER — Encounter (HOSPITAL_COMMUNITY): Payer: Self-pay | Admitting: General Practice

## 2017-05-30 ENCOUNTER — Inpatient Hospital Stay (HOSPITAL_COMMUNITY): Payer: BLUE CROSS/BLUE SHIELD

## 2017-05-30 LAB — URINALYSIS, ROUTINE W REFLEX MICROSCOPIC
Bilirubin Urine: NEGATIVE
GLUCOSE, UA: NEGATIVE mg/dL
KETONES UR: NEGATIVE mg/dL
Leukocytes, UA: NEGATIVE
NITRITE: NEGATIVE
PH: 5 (ref 5.0–8.0)
Protein, ur: 100 mg/dL — AB
SPECIFIC GRAVITY, URINE: 1.024 (ref 1.005–1.030)

## 2017-05-30 LAB — CBC
HCT: 36.3 % — ABNORMAL LOW (ref 39.0–52.0)
Hemoglobin: 11.6 g/dL — ABNORMAL LOW (ref 13.0–17.0)
MCH: 28.6 pg (ref 26.0–34.0)
MCHC: 32 g/dL (ref 30.0–36.0)
MCV: 89.4 fL (ref 78.0–100.0)
PLATELETS: 296 10*3/uL (ref 150–400)
RBC: 4.06 MIL/uL — ABNORMAL LOW (ref 4.22–5.81)
RDW: 13.6 % (ref 11.5–15.5)
WBC: 13.9 10*3/uL — ABNORMAL HIGH (ref 4.0–10.5)

## 2017-05-30 MED ORDER — IOPAMIDOL (ISOVUE-300) INJECTION 61%
INTRAVENOUS | Status: AC
  Administered 2017-05-30: 100 mL
  Filled 2017-05-30: qty 100

## 2017-05-30 MED ORDER — HYDROMORPHONE 1 MG/ML IV SOLN
INTRAVENOUS | Status: DC
  Administered 2017-05-30 (×2): 1.8 mg via INTRAVENOUS
  Administered 2017-05-30: 2.4 mg via INTRAVENOUS
  Administered 2017-05-30: 08:00:00 via INTRAVENOUS
  Administered 2017-05-31: 0.6 mg via INTRAVENOUS
  Administered 2017-05-31: 2.5 mg via INTRAVENOUS
  Administered 2017-05-31: 3.3 mg via INTRAVENOUS
  Administered 2017-05-31: 1.2 mg via INTRAVENOUS
  Administered 2017-05-31: 1.5 mg via INTRAVENOUS
  Administered 2017-05-31: 1.2 mg via INTRAVENOUS
  Administered 2017-05-31: 1.5 mg via INTRAVENOUS
  Administered 2017-06-01: 0.3 mg via INTRAVENOUS
  Filled 2017-05-30: qty 25

## 2017-05-30 MED ORDER — DIPHENHYDRAMINE HCL 50 MG/ML IJ SOLN: 12.5000 mg | Freq: Four times a day (QID) | INTRAMUSCULAR | Status: DC | PRN

## 2017-05-30 MED ORDER — IOPAMIDOL (ISOVUE-300) INJECTION 61%
INTRAVENOUS | Status: AC
  Administered 2017-05-30: 09:00:00
  Filled 2017-05-30: qty 30

## 2017-05-30 MED ORDER — HYDROMORPHONE HCL 1 MG/ML IJ SOLN
2.0000 mg | Freq: Once | INTRAMUSCULAR | Status: AC
  Administered 2017-05-30: 2 mg via INTRAVENOUS

## 2017-05-30 MED ORDER — PIPERACILLIN-TAZOBACTAM 3.375 G IVPB
3.3750 g | Freq: Three times a day (TID) | INTRAVENOUS | Status: DC
  Administered 2017-05-30 – 2017-06-03 (×13): 3.375 g via INTRAVENOUS
  Filled 2017-05-30 (×14): qty 50

## 2017-05-30 MED ORDER — HYDROMORPHONE HCL 1 MG/ML IJ SOLN
INTRAMUSCULAR | Status: AC
  Filled 2017-05-30: qty 2

## 2017-05-30 MED ORDER — SODIUM CHLORIDE 0.9% FLUSH: 9.0000 mL | INTRAVENOUS | Status: DC | PRN

## 2017-05-30 MED ORDER — DIPHENHYDRAMINE HCL 12.5 MG/5ML PO ELIX: 12.5000 mg | ORAL_SOLUTION | Freq: Four times a day (QID) | ORAL | Status: DC | PRN

## 2017-05-30 MED ORDER — NALOXONE HCL 0.4 MG/ML IJ SOLN: 0.4000 mg | INTRAMUSCULAR | Status: DC | PRN

## 2017-05-30 MED ORDER — ONDANSETRON HCL 4 MG/2ML IJ SOLN: 4.0000 mg | Freq: Four times a day (QID) | INTRAMUSCULAR | Status: DC | PRN

## 2017-05-30 NOTE — Progress Notes (Signed)
Md made aware of the temp 103.1. No new order noted.

## 2017-05-30 NOTE — Progress Notes (Signed)
Pt complained of severe abdominal pain , temp 102.1 Resp 27 PR 103, reported to oncall MD with order to give Hydromorphone 2mg  x1and CBC , encouraged pca use and incentive spirometer

## 2017-05-31 ENCOUNTER — Other Ambulatory Visit: Payer: Self-pay

## 2017-05-31 ENCOUNTER — Encounter (HOSPITAL_COMMUNITY): Payer: Self-pay | Admitting: General Practice

## 2017-05-31 LAB — CBC WITH DIFFERENTIAL/PLATELET
Basophils Absolute: 0 10*3/uL (ref 0.0–0.1)
Basophils Relative: 0 %
EOS ABS: 0.6 10*3/uL (ref 0.0–0.7)
EOS PCT: 5 %
HCT: 33.3 % — ABNORMAL LOW (ref 39.0–52.0)
Hemoglobin: 10.9 g/dL — ABNORMAL LOW (ref 13.0–17.0)
Lymphocytes Relative: 9 %
Lymphs Abs: 1.1 10*3/uL (ref 0.7–4.0)
MCH: 28.9 pg (ref 26.0–34.0)
MCHC: 32.7 g/dL (ref 30.0–36.0)
MCV: 88.3 fL (ref 78.0–100.0)
MONO ABS: 1.2 10*3/uL — AB (ref 0.1–1.0)
MONOS PCT: 10 %
Neutro Abs: 8.9 10*3/uL — ABNORMAL HIGH (ref 1.7–7.7)
Neutrophils Relative %: 76 %
PLATELETS: 314 10*3/uL (ref 150–400)
RBC: 3.77 MIL/uL — AB (ref 4.22–5.81)
RDW: 13.7 % (ref 11.5–15.5)
WBC: 11.8 10*3/uL — AB (ref 4.0–10.5)

## 2017-05-31 LAB — BASIC METABOLIC PANEL
Anion gap: 8 (ref 5–15)
BUN: 8 mg/dL (ref 6–20)
CALCIUM: 8.2 mg/dL — AB (ref 8.9–10.3)
CO2: 23 mmol/L (ref 22–32)
CREATININE: 1.16 mg/dL (ref 0.61–1.24)
Chloride: 105 mmol/L (ref 101–111)
GFR calc Af Amer: >60 mL/min (ref >60)
Glucose, Bld: 125 mg/dL — ABNORMAL HIGH (ref 65–99)
Potassium: 3.3 mmol/L — ABNORMAL LOW (ref 3.5–5.1)
Sodium: 136 mmol/L (ref 135–145)

## 2017-05-31 NOTE — Progress Notes (Signed)
5 Days Post-Op   Subjective/Chief Complaint: Still having fever. Breathing seems better   Objective: Vital signs in last 24 hours: Temp:  [98 F (36.7 C)-103.1 F (39.5 C)] 99.9 F (37.7 C) (01/09 0717) Pulse Rate:  [80-93] 88 (01/09 0601) Resp:  [12-24] 20 (01/09 1202) BP: (120-147)/(64-82) 135/82 (01/09 0601) SpO2:  [93 %-99 %] 98 % (01/09 1202) Last BM Date: 05/30/17  Intake/Output from previous day: 01/08 0701 - 01/09 0700 In: 2912.5 [P.O.:375; I.V.:2337.5; IV Piggyback:200] Out: -  Intake/Output this shift: No intake/output data recorded.  General appearance: alert and cooperative Resp: clear to auscultation bilaterally Cardio: regular rate and rhythm GI: soft, moderate tenderness. incision slightly red with some drainage  Lab Results:  Recent Labs    05/30/17 0847 05/31/17 0912  WBC 13.9* 11.8*  HGB 11.6* 10.9*  HCT 36.3* 33.3*  PLT 296 314   BMET Recent Labs    05/29/17 1644 05/31/17 0912  NA 138 136  K 3.7 3.3*  CL 109 105  CO2 25 23  GLUCOSE 132* 125*  BUN 13 8  CREATININE 1.26* 1.16  CALCIUM 8.5* 8.2*   PT/INR No results for input(s): LABPROT, INR in the last 72 hours. ABG No results for input(s): PHART, HCO3 in the last 72 hours.  Invalid input(s): PCO2, PO2  Studies/Results: Ct Abdomen Pelvis W Contrast  Result Date: 05/30/2017 CLINICAL DATA:  Generalized abdominal pain and fever. Laparoscopic sigmoid colectomy 05/26/2017. EXAM: CT ABDOMEN AND PELVIS WITH CONTRAST TECHNIQUE: Multidetector CT imaging of the abdomen and pelvis was performed using the standard protocol following bolus administration of intravenous contrast. CONTRAST:  139mL ISOVUE-300 IOPAMIDOL (ISOVUE-300) INJECTION 61% COMPARISON:  CT scan 05/05/2017 FINDINGS: Lower chest: The lung bases demonstrates small bilateral pleural effusions and moderate bibasilar atelectasis. Hepatobiliary: No focal hepatic lesions or intrahepatic biliary dilatation. The gallbladder is normal. No  common bile duct dilatation. Pancreas: No mass, inflammation or ductal dilatation. Spleen: Normal size.  No focal lesions. Adrenals/Urinary Tract: The adrenal glands and kidneys are unremarkable. No renal, ureteral or bladder calculi or mass. Stomach/Bowel: The stomach, duodenum, small bowel and terminal ileum are normal. Surgical changes from recent colo colonic anastomosis. There are pelvic fluid collections and a small amount of residual free intraperitoneal air likely associated with the recent surgery. No discrete abscess or evidence of perforation. Vascular/Lymphatic: The aorta is normal in caliber. No dissection. The branch vessels are patent. The major venous structures are patent. No mesenteric or retroperitoneal mass or adenopathy. Small scattered lymph nodes are noted. Reproductive: The prostate gland is mildly enlarged. The seminal vesicles appear normal. Other: No inguinal mass or adenopathy. Small inguinal hernias containing fat. Musculoskeletal: No significant bony findings. IMPRESSION: 1. Postoperative changes from recent partial sigmoid colon resection and colocolonic anastomosis. Expected postoperative changes with scattered fluid in the abdomen and pelvis and scattered free air. No discrete abscess or large hematoma. 2. Small bilateral pleural effusions and bibasilar atelectasis. Electronically Signed   By: Marijo Sanes M.D.   On: 05/30/2017 12:25    Anti-infectives: Anti-infectives (From admission, onward)   Start     Dose/Rate Route Frequency Ordered Stop   05/30/17 0730  piperacillin-tazobactam (ZOSYN) IVPB 3.375 g     3.375 g 12.5 mL/hr over 240 Minutes Intravenous Every 8 hours 05/30/17 0727     05/26/17 0745  ciprofloxacin (CIPRO) IVPB 400 mg     400 mg 200 mL/hr over 60 Minutes Intravenous On call to O.R. 05/26/17 0746 05/26/17 1000   05/26/17 0745  metroNIDAZOLE (  FLAGYL) IVPB 500 mg     500 mg 100 mL/hr over 60 Minutes Intravenous On call to O.R. 05/26/17 0746 05/26/17 1030       Assessment/Plan: s/p Procedure(s) with comments: LAPAROSCOPIC ASSISTED SIGMOID COLECTOMY (N/A) - ERAS PATHWAY RIGID SIGMOIDOSCOPY (N/A) Wound was opened at upper portion and packed with gauze  Continue IV zosyn Monitor wbc Allow full liquids Ambulate CT showed no sign of leak  LOS: 5 days    TOTH III,PAUL S 05/31/2017

## 2017-06-01 MED ORDER — HYDROCODONE-ACETAMINOPHEN 5-325 MG PO TABS: 1.0000 | ORAL_TABLET | ORAL | Status: DC | PRN

## 2017-06-01 MED ORDER — OXYCODONE-ACETAMINOPHEN 5-325 MG PO TABS
1.0000 | ORAL_TABLET | ORAL | Status: DC | PRN
  Administered 2017-06-01 (×2): 2 via ORAL
  Administered 2017-06-01 – 2017-06-02 (×4): 1 via ORAL
  Administered 2017-06-02 (×2): 2 via ORAL
  Filled 2017-06-01 (×2): qty 1
  Filled 2017-06-01 (×2): qty 2
  Filled 2017-06-01: qty 1
  Filled 2017-06-01 (×2): qty 2
  Filled 2017-06-01: qty 1
  Filled 2017-06-01: qty 2

## 2017-06-01 MED ORDER — HYDROMORPHONE HCL 1 MG/ML IJ SOLN
0.5000 mg | INTRAMUSCULAR | Status: DC | PRN
  Administered 2017-06-03 (×2): 0.5 mg via INTRAVENOUS
  Filled 2017-06-01 (×2): qty 1

## 2017-06-01 NOTE — Progress Notes (Signed)
6 Days Post-Op   Subjective/Chief Complaint: Feels a little better today. Fevers have gone   Objective: Vital signs in last 24 hours: Temp:  [98.3 F (36.8 C)-99.1 F (37.3 C)] 98.3 F (36.8 C) (01/10 0439) Pulse Rate:  [83-88] 88 (01/10 0439) Resp:  [15-25] 18 (01/10 0439) BP: (147-151)/(86-93) 151/87 (01/10 0439) SpO2:  [95 %-99 %] 98 % (01/10 0439) Last BM Date: 05/31/17  Intake/Output from previous day: 01/09 0701 - 01/10 0700 In: 2069.2 [P.O.:420; I.V.:1549.2; IV Piggyback:100] Out: -  Intake/Output this shift: Total I/O In: 240 [P.O.:240] Out: -   General appearance: alert and cooperative Resp: clear to auscultation bilaterally Cardio: regular rate and rhythm GI: soft, moderate tenderness. wound has small amount of drainage. cellulitis slightly improved  Lab Results:  Recent Labs    05/30/17 0847 05/31/17 0912  WBC 13.9* 11.8*  HGB 11.6* 10.9*  HCT 36.3* 33.3*  PLT 296 314   BMET Recent Labs    05/29/17 1644 05/31/17 0912  NA 138 136  K 3.7 3.3*  CL 109 105  CO2 25 23  GLUCOSE 132* 125*  BUN 13 8  CREATININE 1.26* 1.16  CALCIUM 8.5* 8.2*   PT/INR No results for input(s): LABPROT, INR in the last 72 hours. ABG No results for input(s): PHART, HCO3 in the last 72 hours.  Invalid input(s): PCO2, PO2  Studies/Results: Ct Abdomen Pelvis W Contrast  Result Date: 05/30/2017 CLINICAL DATA:  Generalized abdominal pain and fever. Laparoscopic sigmoid colectomy 05/26/2017. EXAM: CT ABDOMEN AND PELVIS WITH CONTRAST TECHNIQUE: Multidetector CT imaging of the abdomen and pelvis was performed using the standard protocol following bolus administration of intravenous contrast. CONTRAST:  1109mL ISOVUE-300 IOPAMIDOL (ISOVUE-300) INJECTION 61% COMPARISON:  CT scan 05/05/2017 FINDINGS: Lower chest: The lung bases demonstrates small bilateral pleural effusions and moderate bibasilar atelectasis. Hepatobiliary: No focal hepatic lesions or intrahepatic biliary  dilatation. The gallbladder is normal. No common bile duct dilatation. Pancreas: No mass, inflammation or ductal dilatation. Spleen: Normal size.  No focal lesions. Adrenals/Urinary Tract: The adrenal glands and kidneys are unremarkable. No renal, ureteral or bladder calculi or mass. Stomach/Bowel: The stomach, duodenum, small bowel and terminal ileum are normal. Surgical changes from recent colo colonic anastomosis. There are pelvic fluid collections and a small amount of residual free intraperitoneal air likely associated with the recent surgery. No discrete abscess or evidence of perforation. Vascular/Lymphatic: The aorta is normal in caliber. No dissection. The branch vessels are patent. The major venous structures are patent. No mesenteric or retroperitoneal mass or adenopathy. Small scattered lymph nodes are noted. Reproductive: The prostate gland is mildly enlarged. The seminal vesicles appear normal. Other: No inguinal mass or adenopathy. Small inguinal hernias containing fat. Musculoskeletal: No significant bony findings. IMPRESSION: 1. Postoperative changes from recent partial sigmoid colon resection and colocolonic anastomosis. Expected postoperative changes with scattered fluid in the abdomen and pelvis and scattered free air. No discrete abscess or large hematoma. 2. Small bilateral pleural effusions and bibasilar atelectasis. Electronically Signed   By: Marijo Sanes M.D.   On: 05/30/2017 12:25    Anti-infectives: Anti-infectives (From admission, onward)   Start     Dose/Rate Route Frequency Ordered Stop   05/30/17 0730  piperacillin-tazobactam (ZOSYN) IVPB 3.375 g     3.375 g 12.5 mL/hr over 240 Minutes Intravenous Every 8 hours 05/30/17 0727     05/26/17 0745  ciprofloxacin (CIPRO) IVPB 400 mg     400 mg 200 mL/hr over 60 Minutes Intravenous On call to O.R. 05/26/17  [id number] 05/26/17 1000   05/26/17 0745  metroNIDAZOLE (FLAGYL) IVPB 500 mg     500 mg 100 mL/hr over 60 Minutes Intravenous On  call to O.R. 05/26/17 0746 05/26/17 1030      Assessment/Plan: s/p Procedure(s) with comments: LAPAROSCOPIC ASSISTED SIGMOID COLECTOMY (N/A) - ERAS PATHWAY RIGID SIGMOIDOSCOPY (N/A) Advance diet  Continue dressing changes Continue IV zosyn Ambulate Switch to oral pain meds  LOS: 6 days    TOTH III,PAUL S 06/01/2017

## 2017-06-02 NOTE — Care Management Note (Addendum)
Case Management Note  Patient Details  Name: Anthony Knight MRN: [id number] Date of Birth: [birth year]  Subjective/Objective:                    Action/Plan:  Discussed home health RN with patient and wife at bedside.  Wife Pryor Montes) has been shown dressing changes already and can assist at home.   Wife cell [phone], work [phone] Expected Discharge Date:                  Expected Discharge Plan:  [place]  In-House Referral:     Discharge planning Services  CM Consult  Post Acute Care Choice:  Home Health Choice offered to:  Patient  DME Arranged:  N/A DME Agency:  NA  HH Arranged:  RN [place] Agency:  [facility]  Status of Service:  Completed, signed off  If discussed at [place] of Stay Meetings, dates discussed:    Additional Comments:  Marilu Favre, RN 06/02/2017, 9:57 AM

## 2017-06-03 MED ORDER — AMOXICILLIN-POT CLAVULANATE 875-125 MG PO TABS: 1.0000 | ORAL_TABLET | Freq: Two times a day (BID) | ORAL | 0 refills | Status: DC

## 2017-06-03 MED ORDER — MAGIC MOUTHWASH
5.0000 mL | Freq: Three times a day (TID) | ORAL | Status: DC
  Administered 2017-06-03: 5 mL via ORAL
  Filled 2017-06-03: qty 5

## 2017-06-03 MED ORDER — AMOXICILLIN-POT CLAVULANATE 875-125 MG PO TABS
1.0000 | ORAL_TABLET | Freq: Two times a day (BID) | ORAL | Status: DC
  Administered 2017-06-03: 1 via ORAL
  Filled 2017-06-03: qty 1

## 2017-06-03 MED ORDER — MAGIC MOUTHWASH: 5.0000 mL | Freq: Three times a day (TID) | ORAL | 0 refills | Status: DC

## 2017-06-03 MED ORDER — PROMETHAZINE HCL 12.5 MG PO TABS: 12.5000 mg | ORAL_TABLET | Freq: Four times a day (QID) | ORAL | 0 refills | Status: DC | PRN

## 2017-06-03 MED ORDER — OXYCODONE-ACETAMINOPHEN 5-325 MG PO TABS: 1.0000 | ORAL_TABLET | Freq: Four times a day (QID) | ORAL | 0 refills | Status: DC | PRN

## 2017-06-03 NOTE — Progress Notes (Signed)
8 Days Post-Op   Subjective/Chief Complaint: Feels better. Still has some drainage from lower wound   Objective: Vital signs in last 24 hours: Temp:  [98.2 F (36.8 C)-99 F (37.2 C)] 98.3 F (36.8 C) (01/12 0534) Pulse Rate:  [72-85] 81 (01/12 0534) Resp:  [17-18] 17 (01/12 0534) BP: (147-166)/(87-94) 166/87 (01/12 0534) SpO2:  [94 %-97 %] 94 % (01/12 0534) Last BM Date: 06/02/17  Intake/Output from previous day: 01/11 0701 - 01/12 0700 In: 340 [P.O.:340] Out: -  Intake/Output this shift: Total I/O In: 340 [P.O.:340] Out: -   General appearance: alert and cooperative Resp: clear to auscultation bilaterally Cardio: regular rate and rhythm GI: soft, mild to moderate tenderness. still has some cellulitis of abd wall  Lab Results:  Recent Labs    05/31/17 0912  WBC 11.8*  HGB 10.9*  HCT 33.3*  PLT 314   BMET Recent Labs    05/31/17 0912  NA 136  K 3.3*  CL 105  CO2 23  GLUCOSE 125*  BUN 8  CREATININE 1.16  CALCIUM 8.2*   PT/INR No results for input(s): LABPROT, INR in the last 72 hours. ABG No results for input(s): PHART, HCO3 in the last 72 hours.  Invalid input(s): PCO2, PO2  Studies/Results: No results found.  Anti-infectives: Anti-infectives (From admission, onward)   Start     Dose/Rate Route Frequency Ordered Stop   05/30/17 0730  piperacillin-tazobactam (ZOSYN) IVPB 3.375 g     3.375 g 12.5 mL/hr over 240 Minutes Intravenous Every 8 hours 05/30/17 0727     05/26/17 0745  ciprofloxacin (CIPRO) IVPB 400 mg     400 mg 200 mL/hr over 60 Minutes Intravenous On call to O.R. 05/26/17 0746 05/26/17 1000   05/26/17 0745  metroNIDAZOLE (FLAGYL) IVPB 500 mg     500 mg 100 mL/hr over 60 Minutes Intravenous On call to O.R. 05/26/17 0746 05/26/17 1030      Assessment/Plan: s/p Procedure(s) with comments: LAPAROSCOPIC ASSISTED SIGMOID COLECTOMY (N/A) - ERAS PATHWAY RIGID SIGMOIDOSCOPY (N/A) Advance diet  Continue iv abx until cellulitis  improves I removed some lower staples, opened wound and packed with gauze Continue dressing changes  LOS: 8 days    TOTH III,Bronwyn Belasco S 06/03/2017

## 2017-06-03 NOTE — Progress Notes (Signed)
Discharge home. Home discharge instruction given, no question verbalized. Dressing change shown to wife, no question verbalized.

## 2017-06-03 NOTE — Progress Notes (Signed)
Orthopedic Tech Progress Note Patient Details:  Anthony Knight [birth year] [id number]  Ortho Devices Type of Ortho Device: Abdominal binder Ortho Device/Splint Location: 2 Abdominal binders   Post Interventions Instructions Provided: Care of device   [place] Pink 06/03/2017, 12:13 PM

## 2017-06-03 NOTE — Progress Notes (Signed)
8 Days Post-Op   Subjective/Chief Complaint: Complains of soreness and fuzzy feeling on tongue   Objective: Vital signs in last 24 hours: Temp:  [98.2 F (36.8 C)-99 F (37.2 C)] 98.3 F (36.8 C) (01/12 0534) Pulse Rate:  [72-85] 81 (01/12 0534) Resp:  [17-18] 17 (01/12 0534) BP: (147-166)/(87-94) 166/87 (01/12 0534) SpO2:  [94 %-97 %] 94 % (01/12 0534) Last BM Date: 06/02/17  Intake/Output from previous day: 01/11 0701 - 01/12 0700 In: 340 [P.O.:340] Out: -  Intake/Output this shift: No intake/output data recorded.  General appearance: alert and cooperative Resp: clear to auscultation bilaterally Cardio: regular rate and rhythm GI: soft, less tender. cellulitis resolving  Lab Results:  Recent Labs    05/31/17 0912  WBC 11.8*  HGB 10.9*  HCT 33.3*  PLT 314   BMET Recent Labs    05/31/17 0912  NA 136  K 3.3*  CL 105  CO2 23  GLUCOSE 125*  BUN 8  CREATININE 1.16  CALCIUM 8.2*   PT/INR No results for input(s): LABPROT, INR in the last 72 hours. ABG No results for input(s): PHART, HCO3 in the last 72 hours.  Invalid input(s): PCO2, PO2  Studies/Results: No results found.  Anti-infectives: Anti-infectives (From admission, onward)   Start     Dose/Rate Route Frequency Ordered Stop   06/03/17 1000  amoxicillin-clavulanate (AUGMENTIN) 875-125 MG per tablet 1 tablet     1 tablet Oral Every 12 hours 06/03/17 0712     05/30/17 0730  piperacillin-tazobactam (ZOSYN) IVPB 3.375 g     3.375 g 12.5 mL/hr over 240 Minutes Intravenous Every 8 hours 05/30/17 0727     05/26/17 0745  ciprofloxacin (CIPRO) IVPB 400 mg     400 mg 200 mL/hr over 60 Minutes Intravenous On call to O.R. 05/26/17 0746 05/26/17 1000   05/26/17 0745  metroNIDAZOLE (FLAGYL) IVPB 500 mg     500 mg 100 mL/hr over 60 Minutes Intravenous On call to O.R. 05/26/17 0746 05/26/17 1030      Assessment/Plan: s/p Procedure(s) with comments: LAPAROSCOPIC ASSISTED SIGMOID COLECTOMY (N/A) - ERAS  PATHWAY RIGID SIGMOIDOSCOPY (N/A) Advance diet  Magic mouthwash  Continue dressing changes Switch to augmentin discharge  LOS: 8 days    TOTH III,PAUL S 06/03/2017

## 2017-06-05 ENCOUNTER — Telehealth: Payer: Self-pay | Admitting: Oncology

## 2017-06-05 NOTE — Telephone Encounter (Signed)
Spoke with patient reagarding appointment D/T/Loc/Ph#

## 2017-06-06 NOTE — Progress Notes (Signed)
  Oncology Nurse Navigator Documentation  Navigator Location: [facility] (06/06/17 1129) Referral date to RadOnc/MedOnc: 06/05/17 (06/06/17 1129) )Navigator Encounter Type: Introductory phone call (06/06/17 1129)     Confirmed Diagnosis Date: 05/29/17 (06/06/17 1129) Surgery Date: 05/26/17 (06/06/17 1129)           Treatment Initiated Date: 05/26/17 (06/06/17 1129)     Barriers/Navigation Needs: Education (06/06/17 1129) Education: Other(Introduction to [facility]) (06/06/17 1129) Interventions: Coordination of Care;Psycho-social support (06/06/17 1129)  Patient's wife called referral department asking if his appointment that is scheduled with Dr. Benay Spice on 06/15/16 is "too soon." I called and spoke with patient to introduce myself and my role as navigator and to review timing of appointment and what will be discussed.  Patient verbalized understanding that he can call  me with questions or concerns.          Acuity: Level 2 (06/06/17 1129)   Acuity Level 2: Initial guidance, education and coordination as needed;Educational needs (06/06/17 1129)     Time Spent with Patient: 15 (06/06/17 1129)

## 2017-06-08 NOTE — Discharge Summary (Signed)
Physician Discharge Summary  Patient ID: Anthony Knight MRN: [id number] DOB/AGE: 57-Oct-1962 57 y.o.  Admit date: 05/26/2017 Discharge date: 06/08/2017  Admission Diagnoses:  Discharge Diagnoses:  Active Problems:   Colonic stricture [facility])   Discharged Condition: good  Hospital Course: the patient underwent lap assisted sigmoid colectomy for what turned out to be a cancer. His postop course was complicated by persistent fevers. A repeat CT did not show any evidence of leak. He ended up developing a superficial wound infection. The skin was opened and dressing changes were started. He made a lot of progress after this and then was discharged home  Consults: None  Significant Diagnostic Studies: CT  Treatments: surgery: as above  Discharge Exam: Blood pressure (!) 166/87, pulse 81, temperature 98.3 F (36.8 C), temperature source Oral, resp. rate 17, height 6' (1.829 m), weight 99.3 kg (219 lb), SpO2 94 %. General appearance: alert and cooperative Resp: clear to auscultation bilaterally Cardio: regular rate and rhythm GI: soft, mild tenderness. open wound clean  Disposition: 01-Home or Self Care  Discharge Instructions    Call MD for:  difficulty breathing, headache or visual disturbances   Complete by:  As directed    Call MD for:  extreme fatigue   Complete by:  As directed    Call MD for:  hives   Complete by:  As directed    Call MD for:  persistant dizziness or light-headedness   Complete by:  As directed    Call MD for:  persistant nausea and vomiting   Complete by:  As directed    Call MD for:  redness, tenderness, or signs of infection (pain, swelling, redness, odor or green/yellow discharge around incision site)   Complete by:  As directed    Call MD for:  severe uncontrolled pain   Complete by:  As directed    Call MD for:  temperature >100.4   Complete by:  As directed    Diet - low sodium heart healthy   Complete by:  As directed    Discharge instructions    Complete by:  As directed    May shower. Home health to change dressing daily   Increase activity slowly   Complete by:  As directed      Allergies as of 06/03/2017   No Known Allergies     Medication List    TAKE these medications   amoxicillin-clavulanate 875-125 MG tablet Commonly known as:  AUGMENTIN Take 1 tablet by mouth 2 (two) times daily.   aspirin EC 81 MG tablet Take 1 tablet (81 mg total) by mouth daily.   losartan 100 MG tablet Commonly known as:  COZAAR Take 100 mg by mouth daily.   magic mouthwash Soln Take 5 mLs by mouth 3 (three) times daily.   oxyCODONE-acetaminophen 5-325 MG tablet Commonly known as:  PERCOCET/ROXICET Take 1 tablet by mouth every 6 (six) hours as needed for moderate pain or severe pain.   promethazine 12.5 MG tablet Commonly known as:  PHENERGAN Take 1-2 tablets (12.5-25 mg total) by mouth every 6 (six) hours as needed for nausea.      [facility] Follow up.   Why:  Provide home health RN  Contact information: [address] [place] [address] [phone]        Autumn Messing III, MD Follow up in 1 week(s).   Specialty:  General Surgery Contact information: [address]  [place] [address] [phone]           Signed: Merrie Roof 06/08/2017, 8:58 AM

## 2017-06-15 ENCOUNTER — Telehealth: Payer: Self-pay | Admitting: Oncology

## 2017-06-15 ENCOUNTER — Inpatient Hospital Stay: Payer: BLUE CROSS/BLUE SHIELD | Attending: Oncology | Admitting: Oncology

## 2017-06-15 VITALS — BP 129/84 | HR 92 | Temp 97.6°F | Resp 18 | Ht 72.0 in | Wt 201.7 lb

## 2017-06-15 DIAGNOSIS — R109 Unspecified abdominal pain: Secondary | ICD-10-CM | POA: Insufficient documentation

## 2017-06-15 DIAGNOSIS — K5790 Diverticulosis of intestine, part unspecified, without perforation or abscess without bleeding: Secondary | ICD-10-CM

## 2017-06-15 DIAGNOSIS — Z87891 Personal history of nicotine dependence: Secondary | ICD-10-CM | POA: Diagnosis not present

## 2017-06-15 DIAGNOSIS — R197 Diarrhea, unspecified: Secondary | ICD-10-CM | POA: Diagnosis not present

## 2017-06-15 DIAGNOSIS — N400 Enlarged prostate without lower urinary tract symptoms: Secondary | ICD-10-CM | POA: Diagnosis not present

## 2017-06-15 DIAGNOSIS — Z800 Family history of malignant neoplasm of digestive organs: Secondary | ICD-10-CM | POA: Insufficient documentation

## 2017-06-15 DIAGNOSIS — C187 Malignant neoplasm of sigmoid colon: Secondary | ICD-10-CM | POA: Diagnosis not present

## 2017-06-15 DIAGNOSIS — R590 Localized enlarged lymph nodes: Secondary | ICD-10-CM | POA: Diagnosis not present

## 2017-06-15 DIAGNOSIS — R42 Dizziness and giddiness: Secondary | ICD-10-CM | POA: Diagnosis not present

## 2017-06-15 DIAGNOSIS — R630 Anorexia: Secondary | ICD-10-CM | POA: Diagnosis not present

## 2017-06-15 DIAGNOSIS — I10 Essential (primary) hypertension: Secondary | ICD-10-CM | POA: Diagnosis not present

## 2017-06-15 DIAGNOSIS — R634 Abnormal weight loss: Secondary | ICD-10-CM | POA: Diagnosis not present

## 2017-06-15 DIAGNOSIS — Z9889 Other specified postprocedural states: Secondary | ICD-10-CM | POA: Diagnosis not present

## 2017-06-15 DIAGNOSIS — Z8042 Family history of malignant neoplasm of prostate: Secondary | ICD-10-CM | POA: Diagnosis not present

## 2017-06-15 NOTE — Progress Notes (Signed)
START ON PATHWAY REGIMEN - Colorectal     A cycle is every 21 days:     Capecitabine      Oxaliplatin   **Always confirm dose/schedule in your pharmacy ordering system**    Patient Characteristics: Colon Adjuvant, Stage III, Low Risk (T1-3, N1) Current evidence of distant metastases<= No AJCC T Category: Staged < 8th Ed. AJCC N Category: Staged < 8th Ed. AJCC M Category: Staged < 8th Ed. AJCC 8 Stage Grouping: Staged < 8th Ed. Intent of Therapy: Curative Intent, Discussed with Patient

## 2017-06-15 NOTE — Progress Notes (Signed)
[place] Patient Consult   Referring MD: Phelix Fudala 57 y.o.  Jan 30, 1961    Reason for Referral: Colon cancer   HPI: Anthony Knight reports developing intermittent left abdominal pain and diarrhea beginning approximately 1-1/2 years ago.  He has been treated for diverticulitis.  The pain became consistent for approximately 3 months and did not improve with antibiotic therapy. A CT of the abdomen/pelvis 05/05/2018 revealed diverticular changes in the sigmoid colon with resolution of pericolonic inflammatory changes noted on a CT in July 2017.  Wall thickening was noted in the sigmoid colon.  Scattered lymph nodes were noted adjacent to the left common iliac artery, increased in number compared to the previous exam.  The largest node measured less than 1 cm.  There is prostatic enlargement.  No focal liver lesion.  He was referred to Dr. Marlou Starks and was taken to the operating room for a laparoscopic-assisted sigmoid colectomy 05/26/2017.  A masslike area was noted in the sigmoid colon.  No other abnormalities were noted.  A 4-5 mm pedunculated polyp was noted on the posterior rectum and 18 cm on rigid sigmoidoscopy after the anastomosis had been performed.  The pathology ([id number]) confirmed to tumors in the sigmoid colon measuring 3.9 and 6.5 cm in maximum dimension.  Both lesions grossly invading the subserosal adipose tissue.  The 2 lesions are 2.1 cm apart.  The tumor is MSI stable.  The tumors appeared separate but have a similar morphology.  The histology confirmed moderately differentiated adenocarcinoma with tumor extending into pericolonic adipose.  Lymphovascular and perineural invasion are present.  1 of 21 lymph nodes contained metastatic carcinoma.  No macroscopic tumor perforation.  Mismatch repair protein expression return normal.   He reports resolution of abdominal pain following surgery.  He had a postoperative fever and was diagnosed with a wound  infection.  He was treated with antibiotics and discharged home 06/03/2017.  He is completing a course of Augmentin.  Past Medical History:  Diagnosis Date  . History of kidney stones    early 2018  . Hypertension   .  Colon cancer, sigmoid, synchronous primary tumors, T3 in 23 May 2017    Past Surgical History:  Procedure Laterality Date  . LAPAROSCOPIC SIGMOID COLECTOMY N/A 05/26/2017   Procedure: LAPAROSCOPIC ASSISTED SIGMOID COLECTOMY;  Surgeon: Jovita Kussmaul, MD;  Location: [facility];  Service: General;  Laterality: N/A;  ERAS PATHWAY  . rectal tear     Per patient report  . SIGMOIDOSCOPY N/A 05/26/2017   Procedure: RIGID SIGMOIDOSCOPY;  Surgeon: Jovita Kussmaul, MD;  Location: [facility] OR;  Service: General;  Laterality: N/A;  . URETERAL STENT PLACEMENT  2002   kidney stones removal as well    Medications: Reviewed  Allergies: No Known Allergies  Family history: His maternal grandfather died of colon cancer.  2 maternal uncles had "cancer ", one had cancer of the "spleen".  His father had prostate cancer.  No other family history of colorectal cancer.  Social History:   He lives with his wife and son in [place].  He works in an Teacher, adult education.  He quit smoking cigarettes 30 years ago.  He reports social alcohol use.  No transfusion history.  No risk factor for HIV or hepatitis.  ROS:   Positives include: Abdominal pain and diarrhea prior to surgery, anorexia and 20 pound weight loss following surgery, "dizzy "for the past 2 days, fever/infected abdominal wound-resolved after opening of the wound,  tightness and a "pinch "sensation at the wound with certain movements.  A complete ROS was otherwise negative.  Physical Exam:  Blood pressure 129/84, pulse 92, temperature 97.6 F (36.4 C), temperature source Oral, resp. rate 18, height 6' (1.829 m), weight 201 lb 11.2 oz (91.5 kg), SpO2 97 %.  HEENT: Oropharynx without visible mass, neck without mass Lungs: Clear  bilaterally Cardiac: Regular rate and rhythm Abdomen: No hepatomegaly, healing midline wound with 2 approximate 1 cm areas of superficial opening, no surrounding erythema GU: Testes without mass Vascular: No leg edema Lymph nodes: No cervical, supraclavicular, axillary, or inguinal nodes Neurologic: Alert and oriented, the motor exam appears intact in the upper and lower extremities Skin: No rash Musculoskeletal: No spine tenderness   LAB:  CBC  Lab Results  Component Value Date   WBC 11.8 (H) 05/31/2017   HGB 10.9 (L) 05/31/2017   HCT 33.3 (L) 05/31/2017   MCV 88.3 05/31/2017   PLT 314 05/31/2017   NEUTROABS 8.9 (H) 05/31/2017        CMP     Component Value Date/Time   NA 136 05/31/2017 0912   NA 143 01/30/2017 1042   NA 142 11/30/2011 1912   K 3.3 (L) 05/31/2017 0912   K 4.1 11/30/2011 1912   CL 105 05/31/2017 0912   CL 106 11/30/2011 1912   CO2 23 05/31/2017 0912   CO2 29 11/30/2011 1912   GLUCOSE 125 (H) 05/31/2017 0912   GLUCOSE 93 11/30/2011 1912   BUN 8 05/31/2017 0912   BUN 15 01/30/2017 1042   BUN 16 11/30/2011 1912   CREATININE 1.16 05/31/2017 0912   CREATININE 1.67 (H) 11/30/2011 1912   CALCIUM 8.2 (L) 05/31/2017 0912   CALCIUM 9.5 11/30/2011 1912   PROT 6.9 01/30/2017 1042   ALBUMIN 4.1 01/30/2017 1042   AST 14 01/30/2017 1042   ALT 20 01/30/2017 1042   ALKPHOS 97 01/30/2017 1042   BILITOT 0.3 01/30/2017 1042   GFRNONAA >60 05/31/2017 0912   GFRNONAA 47 (L) 11/30/2011 1912   GFRAA >60 05/31/2017 0912   GFRAA 54 (L) 11/30/2011 1912     Lab Results  Component Value Date   CEA1 11.4 (H) 05/26/2017    Imaging:    Assessment/Plan:   1. Adenocarcinoma of the sigmoid colon, synchronous primary tumors separated by 2 cm, status post a sigmoid colectomy 05/26/2017  Stage III (T3N1) moderately differentiated adenocarcinoma, MSI-stable, no loss of mismatch repair protein expression, lymphovascular and perineural invasion noted  Elevated  preoperative CEA  CT abdomen/pelvis 05/05/2018-wall thickening of the sigmoid colon, mesenteric nodes at the left common iliac, sigmoid diverticulosis 2. Hypertension 3. Family history of colon cancer 4. High rectal polyp noted at rigid sigmoidoscopy during surgery 05/26/2017, no preoperative colonoscopy was performed 5. History of kidney stones   Disposition:   Anthony Knight has been diagnosed with colon cancer.  He presented abdominal pain and diarrhea.  He was found to have synchronous sigmoid colon primary tumors.  He does not appear to have hereditary non-polyposis colon cancer syndrome, but his family members are at increased risk of developing colon cancer and should receive appropriate screening.  I reviewed the details of the surgical pathology report with Anthony Knight.  He has a significant risk of developing recurrent colon cancer over the next several years.  I recommend adjuvant chemotherapy.  I discussed the expected benefit from adjuvant 5-fluorouracil and oxaliplatin based therapy.  We discussed FOLFOX and CAPOX chemotherapy.  He would like to proceed with  adjuvant Capox. He will be referred for a staging chest CT and chemotherapy teaching class.  We will asked Dr. Marlou Starks to place a Port-A-Cath for the administration of chemotherapy.  I reviewed the potential toxicities associated with the Capox regimen including the chance for nausea/vomiting, mucositis, diarrhea, and hematologic toxicity.  We discussed the sun sensitivity, rash, hyperpigmentation, and hand/foot syndrome associated with capecitabine.  We discussed the allergic reaction and various types of neuropathy seen with oxaliplatin.  He should complete a colonoscopy within the next 6 months.  We discussed diet and exercise maneuvers that may decrease the risk of developing colon polyps.  I will present his case at the GI tumor conference.  50 minutes were spent with the patient today.  The majority of the time was used for  counseling and coordination of care.  Betsy Coder, MD  06/15/2017, 5:40 PM

## 2017-06-15 NOTE — Telephone Encounter (Signed)
Gave avs and calendar for February  °

## 2017-06-15 NOTE — Progress Notes (Signed)
  Oncology Nurse Navigator Documentation  Navigator Location: [facility] (06/15/17 1622)   )Navigator Encounter Type: Initial MedOnc (06/15/17 1622)                     Patient Visit Type: MedOnc;Initial (06/15/17 1622)   Barriers/Navigation Needs: Education (06/15/17 1622) Education: Understanding Cancer/ Treatment Options;Coping with Diagnosis/ Prognosis;Newly Diagnosed Cancer Education (06/15/17 1622) Interventions: Coordination of Care;Education;Psycho-social support (06/15/17 1622)   Coordination of Care: Other(Dr. Marlou Starks - Port placement, CT scan) (06/15/17 1622) Education Method: Teach-back;Verbal;Written (06/15/17 1622)   Patient materials provided from [employer] on Cancer Staging and treatment options. Basic information on Xeloda and Oxaliplatin provided. Port-A-Cath education completed. Message sent to Dr. Marlou Starks requesting port placement.   Acuity: Level 2 (06/15/17 1622)         Time Spent with Patient: 60 (06/15/17 1622)

## 2017-06-16 ENCOUNTER — Telehealth: Payer: Self-pay | Admitting: *Deleted

## 2017-06-16 ENCOUNTER — Encounter: Payer: Self-pay | Admitting: *Deleted

## 2017-06-16 ENCOUNTER — Ambulatory Visit: Payer: Self-pay | Admitting: General Surgery

## 2017-06-16 NOTE — Telephone Encounter (Signed)
See Consent form encounter dated today for phone note. Foye Spurling, BSN, RN Clinical Research Nurse 06/16/2017 4:12 PM

## 2017-06-19 ENCOUNTER — Inpatient Hospital Stay: Payer: BLUE CROSS/BLUE SHIELD

## 2017-06-19 ENCOUNTER — Encounter: Payer: Self-pay | Admitting: *Deleted

## 2017-06-19 ENCOUNTER — Telehealth: Payer: Self-pay | Admitting: Pharmacist

## 2017-06-19 ENCOUNTER — Encounter (HOSPITAL_BASED_OUTPATIENT_CLINIC_OR_DEPARTMENT_OTHER): Payer: Self-pay | Admitting: *Deleted

## 2017-06-19 ENCOUNTER — Other Ambulatory Visit: Payer: Self-pay

## 2017-06-19 DIAGNOSIS — Z87891 Personal history of nicotine dependence: Secondary | ICD-10-CM | POA: Diagnosis not present

## 2017-06-19 DIAGNOSIS — C187 Malignant neoplasm of sigmoid colon: Secondary | ICD-10-CM

## 2017-06-19 DIAGNOSIS — Z8042 Family history of malignant neoplasm of prostate: Secondary | ICD-10-CM | POA: Diagnosis not present

## 2017-06-19 DIAGNOSIS — R634 Abnormal weight loss: Secondary | ICD-10-CM | POA: Diagnosis not present

## 2017-06-19 DIAGNOSIS — R590 Localized enlarged lymph nodes: Secondary | ICD-10-CM | POA: Diagnosis not present

## 2017-06-19 DIAGNOSIS — Z9889 Other specified postprocedural states: Secondary | ICD-10-CM | POA: Diagnosis not present

## 2017-06-19 DIAGNOSIS — R42 Dizziness and giddiness: Secondary | ICD-10-CM | POA: Diagnosis not present

## 2017-06-19 DIAGNOSIS — I10 Essential (primary) hypertension: Secondary | ICD-10-CM | POA: Diagnosis not present

## 2017-06-19 DIAGNOSIS — Z800 Family history of malignant neoplasm of digestive organs: Secondary | ICD-10-CM | POA: Diagnosis not present

## 2017-06-19 DIAGNOSIS — N400 Enlarged prostate without lower urinary tract symptoms: Secondary | ICD-10-CM | POA: Diagnosis not present

## 2017-06-19 DIAGNOSIS — R197 Diarrhea, unspecified: Secondary | ICD-10-CM | POA: Diagnosis not present

## 2017-06-19 DIAGNOSIS — K5790 Diverticulosis of intestine, part unspecified, without perforation or abscess without bleeding: Secondary | ICD-10-CM | POA: Diagnosis not present

## 2017-06-19 DIAGNOSIS — R630 Anorexia: Secondary | ICD-10-CM | POA: Diagnosis not present

## 2017-06-19 DIAGNOSIS — R109 Unspecified abdominal pain: Secondary | ICD-10-CM | POA: Diagnosis not present

## 2017-06-19 LAB — CBC WITH DIFFERENTIAL (CANCER CENTER ONLY)
Basophils Absolute: 0.1 10*3/uL (ref 0.0–0.1)
Basophils Relative: 1 %
EOS PCT: 3 %
Eosinophils Absolute: 0.3 10*3/uL (ref 0.0–0.5)
HEMATOCRIT: 45 % (ref 38.4–49.9)
Hemoglobin: 14.8 g/dL (ref 13.0–17.1)
LYMPHS PCT: 31 %
Lymphs Abs: 2.6 10*3/uL (ref 0.9–3.3)
MCH: 29.1 pg (ref 27.2–33.4)
MCHC: 32.9 g/dL (ref 32.0–36.0)
MCV: 88.6 fL (ref 79.3–98.0)
MONO ABS: 0.6 10*3/uL (ref 0.1–0.9)
MONOS PCT: 8 %
NEUTROS ABS: 4.9 10*3/uL (ref 1.5–6.5)
Neutrophils Relative %: 57 %
PLATELETS: 448 10*3/uL — AB (ref 140–400)
RBC: 5.08 MIL/uL (ref 4.20–5.82)
RDW: 13.4 % (ref 11.0–15.6)
WBC Count: 8.5 10*3/uL (ref 4.0–10.3)

## 2017-06-19 LAB — CMP (CANCER CENTER ONLY)
ALT: 36 U/L (ref 0–55)
ANION GAP: 8 (ref 3–11)
AST: 15 U/L (ref 5–34)
Albumin: 3.5 g/dL (ref 3.5–5.0)
Alkaline Phosphatase: 114 U/L (ref 40–150)
BILIRUBIN TOTAL: 0.3 mg/dL (ref 0.2–1.2)
BUN: 18 mg/dL (ref 7–26)
CHLORIDE: 106 mmol/L (ref 98–109)
CO2: 27 mmol/L (ref 22–29)
Calcium: 9.9 mg/dL (ref 8.4–10.4)
Creatinine: 1.21 mg/dL (ref 0.70–1.30)
GFR, Estimated: >60 mL/min (ref >60)
Glucose, Bld: 117 mg/dL (ref 70–140)
Potassium: 4.3 mmol/L (ref 3.5–5.1)
Sodium: 141 mmol/L (ref 136–145)
TOTAL PROTEIN: 7.5 g/dL (ref 6.4–8.3)

## 2017-06-19 LAB — CEA (IN HOUSE-CHCC): CEA (CHCC-In House): 1.52 ng/mL (ref 0.00–5.00)

## 2017-06-19 MED ORDER — CAPECITABINE 500 MG PO TABS: 2000.0000 mg | ORAL_TABLET | Freq: Two times a day (BID) | ORAL | 0 refills | Status: DC

## 2017-06-19 NOTE — Telephone Encounter (Signed)
Oral Oncology Pharmacist Encounter  Received new prescription for Xeloda (capecitabine) for the adjuvant treatment of stage III sigmoid colon cancer in conjunction with oxaliplatin, planned duration 3 months (4 cycles).  Labs from 06/19/17 assessed, OK for treatment.  Current medication list in Epic reviewed, no DDIs with Xeloda identified.  Prescription has been e-scribed to [facility] for benefits analysis and approval per insurance requirement.  Oral Oncology Clinic will continue to follow for insurance authorization, copayment issues, initial counseling and start date.  Johny Drilling, PharmD, BCPS, BCOP 06/19/2017 10:58 AM Oral Oncology Clinic [phone]

## 2017-06-19 NOTE — Telephone Encounter (Signed)
Oral Chemotherapy Pharmacist Encounter   I spoke with patient and wife in chemotherapy education class for overview of: Xeloda (capecitabine).   Counseled patient on administration, dosing, side effects, monitoring, drug-food interactions, safe handling, storage, and disposal.  Patient will take Xeloda 500mg  tablets, 4 tablets (2000mg ) by mouth in AM and 4 tabs (2000mg ) by mouth in PM, within 30 minutes of finishing meals, on days 1-14 of each 21 day cycle.   Oxaliplatin will be infused on day 1 of each 21 day cycle.  Xeloda and oxaliplatin start date: 06/29/17  Side effects of Xeloda include but not limited to: fatigue, decreased blood counts, GI upset, diarrhea, and hand-foot syndrome. Patient has loperamide at home and will call the office if diarrhea develops.    Reviewed with patient importance of keeping a medication schedule and plan for any missed doses.  Mr. And Mrs. Langlinais voiced understanding and appreciation.   All questions answered. Medication reconciliation performed and medication/allergy list updated.  Patient updated on specialty pharmacy process and provided contact information for [facility] (ph: [phone]).   Patient knows to call the office with questions or concerns. Oral Oncology Clinic will continue to follow.  Thank you,  Johny Drilling, PharmD, BCPS, BCOP 06/19/2017   12:20 PM Oral Oncology Clinic [phone]

## 2017-06-19 NOTE — Progress Notes (Unsigned)
Chemo education completed please see education tab

## 2017-06-19 NOTE — Progress Notes (Signed)
Ensure pre surgery drink given with instructions to complete by 0715 dos, pt verbalized understanding. 

## 2017-06-20 ENCOUNTER — Telehealth: Payer: Self-pay

## 2017-06-20 DIAGNOSIS — C187 Malignant neoplasm of sigmoid colon: Secondary | ICD-10-CM

## 2017-06-20 MED ORDER — PROCHLORPERAZINE MALEATE 10 MG PO TABS: 10.0000 mg | ORAL_TABLET | Freq: Four times a day (QID) | ORAL | 2 refills | Status: DC | PRN

## 2017-06-20 NOTE — Telephone Encounter (Signed)
Spoke with pt to inform that compazine has been sent in to pharmacy. Voiced understanding.

## 2017-06-22 ENCOUNTER — Ambulatory Visit (HOSPITAL_BASED_OUTPATIENT_CLINIC_OR_DEPARTMENT_OTHER): Payer: BLUE CROSS/BLUE SHIELD | Admitting: Certified Registered"

## 2017-06-22 ENCOUNTER — Encounter (HOSPITAL_BASED_OUTPATIENT_CLINIC_OR_DEPARTMENT_OTHER)
Admission: RE | Disposition: A | Discharge status: Home / Self Care | Payer: Self-pay | Source: Ambulatory Visit | Attending: General Surgery

## 2017-06-22 ENCOUNTER — Encounter (HOSPITAL_BASED_OUTPATIENT_CLINIC_OR_DEPARTMENT_OTHER): Payer: Self-pay

## 2017-06-22 ENCOUNTER — Telehealth: Payer: Self-pay | Admitting: Pharmacy Technician

## 2017-06-22 ENCOUNTER — Ambulatory Visit (HOSPITAL_COMMUNITY): Payer: BLUE CROSS/BLUE SHIELD

## 2017-06-22 ENCOUNTER — Other Ambulatory Visit: Payer: Self-pay

## 2017-06-22 ENCOUNTER — Ambulatory Visit (HOSPITAL_BASED_OUTPATIENT_CLINIC_OR_DEPARTMENT_OTHER)
Admission: RE | Admit: 2017-06-22 | Discharge: 2017-06-22 | Disposition: A | Discharge status: Home / Self Care | Payer: BLUE CROSS/BLUE SHIELD | Source: Ambulatory Visit | Attending: General Surgery | Admitting: General Surgery

## 2017-06-22 DIAGNOSIS — I10 Essential (primary) hypertension: Secondary | ICD-10-CM | POA: Diagnosis not present

## 2017-06-22 DIAGNOSIS — Z79899 Other long term (current) drug therapy: Secondary | ICD-10-CM | POA: Insufficient documentation

## 2017-06-22 DIAGNOSIS — N400 Enlarged prostate without lower urinary tract symptoms: Secondary | ICD-10-CM | POA: Diagnosis not present

## 2017-06-22 DIAGNOSIS — Z452 Encounter for adjustment and management of vascular access device: Secondary | ICD-10-CM | POA: Insufficient documentation

## 2017-06-22 DIAGNOSIS — C189 Malignant neoplasm of colon, unspecified: Secondary | ICD-10-CM | POA: Insufficient documentation

## 2017-06-22 DIAGNOSIS — C187 Malignant neoplasm of sigmoid colon: Secondary | ICD-10-CM | POA: Diagnosis not present

## 2017-06-22 DIAGNOSIS — J9811 Atelectasis: Secondary | ICD-10-CM | POA: Diagnosis not present

## 2017-06-22 DIAGNOSIS — K5732 Diverticulitis of large intestine without perforation or abscess without bleeding: Secondary | ICD-10-CM | POA: Insufficient documentation

## 2017-06-22 DIAGNOSIS — Z95828 Presence of other vascular implants and grafts: Secondary | ICD-10-CM

## 2017-06-22 DIAGNOSIS — Z87891 Personal history of nicotine dependence: Secondary | ICD-10-CM | POA: Diagnosis not present

## 2017-06-22 HISTORY — PX: PORTACATH PLACEMENT: SHX2246

## 2017-06-22 HISTORY — DX: Malignant (primary) neoplasm, unspecified: C80.1

## 2017-06-22 SURGERY — INSERTION, TUNNELED CENTRAL VENOUS DEVICE, WITH PORT
Anesthesia: General | Site: Chest | Laterality: Left

## 2017-06-22 MED ORDER — SILVER NITRATE-POT NITRATE 75-25 % EX MISC
CUTANEOUS | Status: AC
Start: 2017-06-22 — End: ?
  Filled 2017-06-22: qty 2

## 2017-06-22 MED ORDER — LIDOCAINE HCL (CARDIAC) 20 MG/ML IV SOLN
INTRAVENOUS | Status: DC | PRN
  Administered 2017-06-22: 60 mg via INTRAVENOUS

## 2017-06-22 MED ORDER — KETOROLAC TROMETHAMINE 30 MG/ML IJ SOLN
INTRAMUSCULAR | Status: AC
  Filled 2017-06-22: qty 1

## 2017-06-22 MED ORDER — FENTANYL CITRATE (PF) 100 MCG/2ML IJ SOLN
INTRAMUSCULAR | Status: DC | PRN
  Administered 2017-06-22 (×4): 25 ug via INTRAVENOUS

## 2017-06-22 MED ORDER — FENTANYL CITRATE (PF) 100 MCG/2ML IJ SOLN: 50.0000 ug | INTRAMUSCULAR | Status: DC | PRN

## 2017-06-22 MED ORDER — ONDANSETRON HCL 4 MG/2ML IJ SOLN
INTRAMUSCULAR | Status: DC | PRN
  Administered 2017-06-22: 4 mg via INTRAVENOUS

## 2017-06-22 MED ORDER — FENTANYL CITRATE (PF) 100 MCG/2ML IJ SOLN
INTRAMUSCULAR | Status: AC
  Filled 2017-06-22: qty 2

## 2017-06-22 MED ORDER — PROPOFOL 10 MG/ML IV BOLUS
INTRAVENOUS | Status: DC | PRN
  Administered 2017-06-22: 200 mg via INTRAVENOUS
  Administered 2017-06-22: 50 mg via INTRAVENOUS

## 2017-06-22 MED ORDER — HEPARIN SOD (PORK) LOCK FLUSH 100 UNIT/ML IV SOLN
INTRAVENOUS | Status: DC | PRN
  Administered 2017-06-22: 500 [IU]

## 2017-06-22 MED ORDER — PROPOFOL 10 MG/ML IV BOLUS
INTRAVENOUS | Status: AC
  Filled 2017-06-22: qty 20

## 2017-06-22 MED ORDER — OXYCODONE HCL 5 MG PO TABS: 5.0000 mg | ORAL_TABLET | Freq: Four times a day (QID) | ORAL | 0 refills | Status: DC | PRN

## 2017-06-22 MED ORDER — ACETAMINOPHEN 500 MG PO TABS
1000.0000 mg | ORAL_TABLET | ORAL | Status: AC
  Administered 2017-06-22: 1000 mg via ORAL

## 2017-06-22 MED ORDER — CHLORHEXIDINE GLUCONATE CLOTH 2 % EX PADS: 6.0000 | MEDICATED_PAD | Freq: Once | CUTANEOUS | Status: DC

## 2017-06-22 MED ORDER — DEXAMETHASONE SODIUM PHOSPHATE 4 MG/ML IJ SOLN
INTRAMUSCULAR | Status: DC | PRN
  Administered 2017-06-22: 10 mg via INTRAVENOUS

## 2017-06-22 MED ORDER — CELECOXIB 200 MG PO CAPS
200.0000 mg | ORAL_CAPSULE | ORAL | Status: AC
  Administered 2017-06-22: 200 mg via ORAL

## 2017-06-22 MED ORDER — BUPIVACAINE HCL (PF) 0.25 % IJ SOLN
INTRAMUSCULAR | Status: DC | PRN
  Administered 2017-06-22: 8 mL

## 2017-06-22 MED ORDER — CEFAZOLIN SODIUM-DEXTROSE 2-4 GM/100ML-% IV SOLN
2.0000 g | INTRAVENOUS | Status: AC
  Administered 2017-06-22: 2 g via INTRAVENOUS

## 2017-06-22 MED ORDER — GABAPENTIN 300 MG PO CAPS
300.0000 mg | ORAL_CAPSULE | ORAL | Status: AC
  Administered 2017-06-22: 300 mg via ORAL

## 2017-06-22 MED ORDER — MIDAZOLAM HCL 2 MG/2ML IJ SOLN: 1.0000 mg | INTRAMUSCULAR | Status: DC | PRN

## 2017-06-22 MED ORDER — FENTANYL CITRATE (PF) 100 MCG/2ML IJ SOLN: 25.0000 ug | INTRAMUSCULAR | Status: DC | PRN

## 2017-06-22 MED ORDER — CEFAZOLIN SODIUM-DEXTROSE 2-4 GM/100ML-% IV SOLN
INTRAVENOUS | Status: AC
Start: 2017-06-22 — End: ?
  Filled 2017-06-22: qty 100

## 2017-06-22 MED ORDER — ACETAMINOPHEN 500 MG PO TABS
ORAL_TABLET | ORAL | Status: AC
  Filled 2017-06-22: qty 2

## 2017-06-22 MED ORDER — CELECOXIB 200 MG PO CAPS
ORAL_CAPSULE | ORAL | Status: AC
  Filled 2017-06-22: qty 1

## 2017-06-22 MED ORDER — LIDOCAINE 2% (20 MG/ML) 5 ML SYRINGE
INTRAMUSCULAR | Status: AC
  Filled 2017-06-22: qty 5

## 2017-06-22 MED ORDER — LACTATED RINGERS IV SOLN
INTRAVENOUS | Status: DC
  Administered 2017-06-22: 11:00:00 via INTRAVENOUS

## 2017-06-22 MED ORDER — MIDAZOLAM HCL 5 MG/5ML IJ SOLN
INTRAMUSCULAR | Status: DC | PRN
  Administered 2017-06-22: 2 mg via INTRAVENOUS

## 2017-06-22 MED ORDER — SCOPOLAMINE 1 MG/3DAYS TD PT72: 1.0000 | MEDICATED_PATCH | Freq: Once | TRANSDERMAL | Status: DC | PRN

## 2017-06-22 MED ORDER — ONDANSETRON HCL 4 MG/2ML IJ SOLN
INTRAMUSCULAR | Status: AC
Start: 2017-06-22 — End: ?
  Filled 2017-06-22: qty 2

## 2017-06-22 MED ORDER — MIDAZOLAM HCL 2 MG/2ML IJ SOLN
INTRAMUSCULAR | Status: AC
  Filled 2017-06-22: qty 2

## 2017-06-22 MED ORDER — HEPARIN (PORCINE) IN NACL 2-0.9 UNIT/ML-% IJ SOLN
INTRAMUSCULAR | Status: AC | PRN
  Administered 2017-06-22: 1 via INTRAVENOUS

## 2017-06-22 MED ORDER — KETOROLAC TROMETHAMINE 30 MG/ML IJ SOLN
INTRAMUSCULAR | Status: DC | PRN
  Administered 2017-06-22: 30 mg via INTRAVENOUS

## 2017-06-22 MED ORDER — GABAPENTIN 300 MG PO CAPS
ORAL_CAPSULE | ORAL | Status: AC
  Filled 2017-06-22: qty 1

## 2017-06-22 MED ORDER — DEXAMETHASONE SODIUM PHOSPHATE 10 MG/ML IJ SOLN
INTRAMUSCULAR | Status: AC
  Filled 2017-06-22: qty 1

## 2017-06-22 SURGICAL SUPPLY — 47 items
BAG DECANTER FOR FLEXI CONT (MISCELLANEOUS) ×2 IMPLANT
BLADE SURG 15 STRL LF DISP TIS (BLADE) ×1 IMPLANT
BLADE SURG 15 STRL SS (BLADE) ×1
CANISTER SUCT 1200ML W/VALVE (MISCELLANEOUS) IMPLANT
CHLORAPREP W/TINT 26ML (MISCELLANEOUS) ×2 IMPLANT
CLEANER CAUTERY TIP 5X5 PAD (MISCELLANEOUS) IMPLANT
COVER BACK TABLE 60X90IN (DRAPES) ×2 IMPLANT
COVER MAYO STAND STRL (DRAPES) ×2 IMPLANT
DECANTER SPIKE VIAL GLASS SM (MISCELLANEOUS) IMPLANT
DERMABOND ADVANCED (GAUZE/BANDAGES/DRESSINGS) ×1
DERMABOND ADVANCED .7 DNX12 (GAUZE/BANDAGES/DRESSINGS) ×1 IMPLANT
DRAPE C-ARM 42X72 X-RAY (DRAPES) ×2 IMPLANT
DRAPE LAPAROSCOPIC ABDOMINAL (DRAPES) ×2 IMPLANT
DRAPE UTILITY XL STRL (DRAPES) ×2 IMPLANT
ELECT REM PT RETURN 9FT ADLT (ELECTROSURGICAL) ×2
ELECTRODE REM PT RTRN 9FT ADLT (ELECTROSURGICAL) ×1 IMPLANT
GLOVE BIO SURGEON STRL SZ7 (GLOVE) ×4 IMPLANT
GLOVE BIO SURGEON STRL SZ7.5 (GLOVE) ×2 IMPLANT
GLOVE BIOGEL PI IND STRL 7.5 (GLOVE) ×1 IMPLANT
GLOVE BIOGEL PI INDICATOR 7.5 (GLOVE) ×1
GLOVE SURG SS PI 7.0 STRL IVOR (GLOVE) ×2 IMPLANT
GOWN STRL REUS W/ TWL LRG LVL3 (GOWN DISPOSABLE) ×2 IMPLANT
GOWN STRL REUS W/TWL LRG LVL3 (GOWN DISPOSABLE) ×2
IV KIT MINILOC 20X1 SAFETY (NEEDLE) IMPLANT
KIT PORT POWER 8FR ISP CVUE (Miscellaneous) ×2 IMPLANT
NDL SAFETY ECLIPSE 18X1.5 (NEEDLE) IMPLANT
NEEDLE HYPO 18GX1.5 SHARP (NEEDLE)
NEEDLE HYPO 22GX1.5 SAFETY (NEEDLE) IMPLANT
NEEDLE HYPO 25X1 1.5 SAFETY (NEEDLE) ×2 IMPLANT
NEEDLE SPNL 22GX3.5 QUINCKE BK (NEEDLE) IMPLANT
PACK BASIN DAY SURGERY FS (CUSTOM PROCEDURE TRAY) ×2 IMPLANT
PAD CLEANER CAUTERY TIP 5X5 (MISCELLANEOUS)
PENCIL BUTTON HOLSTER BLD 10FT (ELECTRODE) ×2 IMPLANT
SLEEVE SCD COMPRESS KNEE MED (MISCELLANEOUS) ×2 IMPLANT
SUT MON AB 4-0 PC3 18 (SUTURE) ×2 IMPLANT
SUT PROLENE 2 0 SH DA (SUTURE) ×2 IMPLANT
SUT SILK 2 0 TIES 17X18 (SUTURE)
SUT SILK 2-0 18XBRD TIE BLK (SUTURE) IMPLANT
SUT VIC AB 3-0 SH 27 (SUTURE) ×1
SUT VIC AB 3-0 SH 27X BRD (SUTURE) ×1 IMPLANT
SYR 10ML LL (SYRINGE) IMPLANT
SYR 5ML LL (SYRINGE) ×2 IMPLANT
SYR CONTROL 10ML LL (SYRINGE) ×2 IMPLANT
TOWEL OR 17X24 6PK STRL BLUE (TOWEL DISPOSABLE) ×2 IMPLANT
TOWEL OR NON WOVEN STRL DISP B (DISPOSABLE) ×2 IMPLANT
TUBE CONNECTING 20X1/4 (TUBING) IMPLANT
YANKAUER SUCT BULB TIP NO VENT (SUCTIONS) IMPLANT

## 2017-06-22 NOTE — H&P (Signed)
Anthony Knight  Location: [facility] Patient #: [id number] DOB: 1961/01/29 Married / Language: English / Race: White Male   History of Present Illness The patient is a 57 year old male who presents for a follow-up for Abdominal pain. The patient is a 57 year old white male who we saw a couple weeks ago with left lower quadrant pain and a history of diverticulitis. We treated him empirically with some Augmentin and Flagyl. He has had no improvement in his symptoms. He also underwent a CT scan that did not show any significant inflammatory change around the sigmoid colon but did show some chronic thickening of the wall of the sigmoid colon. He also reports a significant history of colon cancer in quite a few members of his family.   Allergies No Known Drug Allergies  Allergies Reconciled   Medication History  Amoxicillin-Pot Clavulanate (875-125MG  Tablet, 1 (one) Oral two times daily, Taken starting 04/24/2017) Active. Flagyl (500MG  Tablet, 1 (one) Oral three times daily, Taken starting 04/24/2017) Active. Losartan Potassium (100MG  Tablet, Oral) Active. Medications Reconciled    Review of Systems  General Present- Fatigue. Not Present- Appetite Loss, Chills, Fever, Night Sweats, Weight Gain and Weight Loss. Skin Not Present- Change in Wart/Mole, Dryness, Hives, Jaundice, New Lesions, Non-Healing Wounds, Rash and Ulcer. HEENT Present- Ringing in the Ears. Not Present- Earache, Hearing Loss, Hoarseness, Nose Bleed, Oral Ulcers, Seasonal Allergies, Sinus Pain, Sore Throat, Visual Disturbances, Wears glasses/contact lenses and Yellow Eyes. Respiratory Present- Snoring. Not Present- Bloody sputum, Chronic Cough, Difficulty Breathing and Wheezing. Breast Not Present- Breast Mass, Breast Pain, Nipple Discharge and Skin Changes. Cardiovascular Not Present- Chest Pain, Difficulty Breathing Lying Down, Leg Cramps, Palpitations, Rapid Heart Rate, Shortness of Breath and Swelling  of Extremities. Gastrointestinal Present- Abdominal Pain, Bloody Stool and Change in Bowel Habits. Not Present- Bloating, Chronic diarrhea, Constipation, Difficulty Swallowing, Excessive gas, Gets full quickly at meals, Hemorrhoids, Indigestion, Nausea, Rectal Pain and Vomiting. Male Genitourinary Not Present- Blood in Urine, Change in Urinary Stream, Frequency, Impotence, Nocturia, Painful Urination, Urgency and Urine Leakage.  Vitals  Weight: 218.5 lb Height: 72in Body Surface Area: 2.21 m Body Mass Index: 29.63 kg/m  Temp.: 97.7F  Pulse: 80 (Regular)  BP: 126/84 (Sitting, Left Arm, Standard)       Physical Exam General Mental Status-Alert. General Appearance-Consistent with stated age. Hydration-Well hydrated. Voice-Normal.  Head and Neck Head-normocephalic, atraumatic with no lesions or palpable masses. Trachea-midline. Thyroid Gland Characteristics - normal size and consistency.  Eye Eyeball - Bilateral-Extraocular movements intact. Sclera/Conjunctiva - Bilateral-No scleral icterus.  Chest and Lung Exam Chest and lung exam reveals -quiet, even and easy respiratory effort with no use of accessory muscles and on auscultation, normal breath sounds, no adventitious sounds and normal vocal resonance. Inspection Chest Wall - Normal. Back - normal.  Cardiovascular Cardiovascular examination reveals -normal heart sounds, regular rate and rhythm with no murmurs and normal pedal pulses bilaterally.  Abdomen Note: the abdomen is soft with moderate focal tenderness in the left lower quadrant but no guarding or peritonitis. There is no palpable mass. There are no surgical scars.   Neurologic Neurologic evaluation reveals -alert and oriented x 3 with no impairment of recent or remote memory. Mental Status-Normal.  Musculoskeletal Normal Exam - Left-Upper Extremity Strength Normal and Lower Extremity Strength Normal. Normal Exam -  Right-Upper Extremity Strength Normal and Lower Extremity Strength Normal.  Lymphatic Head & Neck  General Head & Neck Lymphatics: Bilateral - Description - Normal. Axillary  General Axillary Region: Bilateral -  Description - Normal. Tenderness - Non Tender. Femoral & Inguinal  Generalized Femoral & Inguinal Lymphatics: Bilateral - Description - Normal. Tenderness - Non Tender.    Assessment & Plan  DIVERTICULITIS OF SIGMOID COLON (K57.32) Impression: The patient appears to have some chronic thickening of the sigmoid colon. It is difficult to tell if this is burned out diverticulitis or if there could be an underlying mass. Because of this I would like him to be evaluated by our colorectal surgeon with a colonoscopy as soon as possible. Either way think he is going to require a sigmoid colectomy. I have discussed with him in detail the risks and benefits of the operation as well as some of the technical aspects and he understands and wishes to proceed   Pt is now s/p sigmoid colectomy for colon cancer. He will need a port for chemotherapy. I have discussed with him in detail the risks and benefits of the surgery including pneumothorax as well as some of the technical aspects and he understands and wishes to proceed.

## 2017-06-22 NOTE — Interval H&P Note (Signed)
History and Physical Interval Note:  06/22/2017 10:15 AM  Anthony Knight  has presented today for surgery, with the diagnosis of COLON CANCER  The various methods of treatment have been discussed with the patient and family. After consideration of risks, benefits and other options for treatment, the patient has consented to  Procedure(s): INSERTION PORT-A-CATH (N/A) as a surgical intervention .  The patient's history has been reviewed, patient examined, no change in status, stable for surgery.  I have reviewed the patient's chart and labs.  Questions were answered to the patient's satisfaction.     TOTH III,Shadee Montoya S

## 2017-06-22 NOTE — Telephone Encounter (Signed)
Oral Oncology Patient Advocate Encounter  Received notification from [name] that prior authorization for Xeloda is required.  PA submitted on CoverMyMeds Key [id number] Status is pending  Oral Oncology Clinic will continue to follow.  Anthony Knight. [name], [name] [phone] 06/22/2017 4:20 PM

## 2017-06-22 NOTE — Anesthesia Preprocedure Evaluation (Addendum)
Anesthesia Evaluation  Patient identified by MRN, date of birth, ID band Patient awake    Reviewed: Allergy & Precautions, H&P , NPO status , Patient's Chart, lab work & pertinent test results  Airway Mallampati: II  TM Distance: >3 FB Neck ROM: Full    Dental no notable dental hx. (+) Teeth Intact, Dental Advisory Given   Pulmonary neg pulmonary ROS, former smoker,    Pulmonary exam normal breath sounds clear to auscultation       Cardiovascular hypertension, Pt. on medications  Rhythm:Regular Rate:Normal     Neuro/Psych  Headaches, negative psych ROS   GI/Hepatic negative GI ROS, Neg liver ROS,   Endo/Other  negative endocrine ROS  Renal/GU negative Renal ROS  negative genitourinary   Musculoskeletal   Abdominal   Peds  Hematology negative hematology ROS (+)   Anesthesia Other Findings   Reproductive/Obstetrics negative OB ROS                            Anesthesia Physical Anesthesia Plan  ASA: II  Anesthesia Plan: General   Post-op Pain Management:    Induction: Intravenous  PONV Risk Score and Plan: 3 and Ondansetron, Dexamethasone and Midazolam  Airway Management Planned: LMA  Additional Equipment:   Intra-op Plan:   Post-operative Plan: Extubation in OR  Informed Consent: I have reviewed the patients History and Physical, chart, labs and discussed the procedure including the risks, benefits and alternatives for the proposed anesthesia with the patient or authorized representative who has indicated his/her understanding and acceptance.   Dental advisory given  Plan Discussed with: CRNA  Anesthesia Plan Comments:         Anesthesia Quick Evaluation

## 2017-06-22 NOTE — Op Note (Signed)
06/22/2017  11:32 AM  PATIENT:  Anthony Knight  57 y.o. male  PRE-OPERATIVE DIAGNOSIS:  COLON CANCER  POST-OPERATIVE DIAGNOSIS:  COLON CANCER  PROCEDURE:  Procedure(s): INSERTION PORT-A-CATH (Left)  SURGEON:  Surgeon(s) and Role:    * Jovita Kussmaul, MD - Primary  PHYSICIAN ASSISTANT:   ASSISTANTS: none   ANESTHESIA:   local and general  EBL:  2 mL   BLOOD ADMINISTERED:none  DRAINS: none   LOCAL MEDICATIONS USED:  MARCAINE     SPECIMEN:  No Specimen  DISPOSITION OF SPECIMEN:  N/A  COUNTS:  YES  TOURNIQUET:  * No tourniquets in log *  DICTATION: .Dragon Dictation   After informed consent was obtained the patient was brought to the operating room and placed in the supine position on the operating table.  After adequate induction of general anesthesia roll was placed between the patient's shoulder blades to extend the shoulder slightly.  The left chest and neck area were then prepped with ChloraPrep, allowed to dry, and draped in usual sterile manner.  An appropriate timeout was performed.  The patient was placed in Trendelenburg position.  The area lateral to the bend of the clavicle on the left chest wall was infiltrated with quarter percent Marcaine.  A large bore needle from the Port-A-Cath kit was used to slide beneath the bend of the clavicle on the left chest wall heading towards the sternal notch and in doing so I was able to access the left subclavian vein without difficulty.  A wire was fed through the needle using the Seldinger technique without difficulty.  On the wire was confirmed in the central venous system using real-time fluoroscopy.  Next a small incision was made on the left chest wall at the wire entry site.  The incision was carried through the skin and subcutaneous tissue sharply with the electrocautery.  A subcutaneous pocket was created inferior to the incision by blunt finger dissection.  Next the tubing was placed on the reservoir.  The reservoir was  placed in the pocket and the length of the tubing was again estimated using real-time fluoroscopy.  The tubing was cut to the appropriate length.  Next a sheath and dilator were fed over the wire using the Seldinger technique without difficulty.  The dilator and wire were removed.  The tubing was fed through the sheath as far as it would go and then held in place while the sheath was gently cracked and separated.  Another real-time fluoroscopy image showed the tip of the catheter to be in the distal superior vena cava.  The tubing was then permanently anchored to the reservoir.  The reservoir was anchored in the pocket with 2 2-0 Prolene stitches.  The port was then aspirated and it aspirated blood easily.  The port was then flushed initially with a dilute heparin solution and then with a more concentrated heparin solution.  The subcutaneous tissue was closed over the port with interrupted 3-0 Vicryl stitches.  The skin was then closed with a running 4-0 Monocryl subcuticular stitch.  Dermabond dressings were applied.  The patient tolerated the procedure well.  At the end of the case all needle sponge and instrument counts were correct.  The patient was then awakened and taken to recovery in stable condition.  PLAN OF CARE: Discharge to home after PACU  PATIENT DISPOSITION:  PACU - hemodynamically stable.   Delay start of Pharmacological VTE agent (>24hrs) due to surgical blood loss or risk of bleeding: not applicable

## 2017-06-22 NOTE — Anesthesia Procedure Notes (Signed)
Procedure Name: LMA Insertion Date/Time: 06/22/2017 10:48 AM Performed by: Justice Rocher, CRNA Pre-anesthesia Checklist: Patient identified, Emergency Drugs available, Suction available and Patient being monitored Patient Re-evaluated:Patient Re-evaluated prior to induction Oxygen Delivery Method: Circle system utilized Preoxygenation: Pre-oxygenation with 100% oxygen Induction Type: IV induction Ventilation: Mask ventilation without difficulty LMA: LMA inserted LMA Size: 5.0 Number of attempts: 1 Airway Equipment and Method: Bite block Placement Confirmation: positive ETCO2 and breath sounds checked- equal and bilateral Tube secured with: Tape Dental Injury: Teeth and Oropharynx as per pre-operative assessment

## 2017-06-22 NOTE — Anesthesia Postprocedure Evaluation (Signed)
Anesthesia Post Note  Patient: Anthony ALVIZO  Procedure(s) Performed: INSERTION PORT-A-CATH (Left Chest)     Patient location during evaluation: PACU Anesthesia Type: General Level of consciousness: awake and alert Pain management: pain level controlled Vital Signs Assessment: post-procedure vital signs reviewed and stable Respiratory status: spontaneous breathing, nonlabored ventilation and respiratory function stable Cardiovascular status: blood pressure returned to baseline and stable Postop Assessment: no apparent nausea or vomiting Anesthetic complications: no    Last Vitals:  Vitals:   06/22/17 1230 06/22/17 1237  BP: 139/84   Pulse: 65 69  Resp: 17 19  Temp:    SpO2: 97% 97%    Last Pain:  Vitals:   06/22/17 1245  TempSrc:   PainSc: 0-No pain                 Desmond Tufano,W. EDMOND

## 2017-06-22 NOTE — Transfer of Care (Signed)
Immediate Anesthesia Transfer of Care Note  Patient: Anthony Knight  Procedure(s) Performed: Procedure(s) (LRB): INSERTION PORT-A-CATH (Left)  Patient Location: PACU  Anesthesia Type: General  Level of Consciousness: awake, sedated, patient cooperative and responds to stimulation  Airway & Oxygen Therapy: Patient Spontanous Breathing and Patient connected to face mask oxygen  Post-op Assessment: Report given to PACU RN, Post -op Vital signs reviewed and stable and Patient moving all extremities  Post vital signs: Reviewed and stable  Complications: No apparent anesthesia complications

## 2017-06-22 NOTE — Discharge Instructions (Signed)

## 2017-06-25 ENCOUNTER — Other Ambulatory Visit: Payer: Self-pay | Admitting: Oncology

## 2017-06-26 ENCOUNTER — Ambulatory Visit
Admission: RE | Admit: 2017-06-26 | Discharge: 2017-06-26 | Disposition: A | Discharge status: Home / Self Care | Payer: BLUE CROSS/BLUE SHIELD | Source: Ambulatory Visit | Attending: Oncology | Admitting: Oncology

## 2017-06-26 DIAGNOSIS — I2510 Atherosclerotic heart disease of native coronary artery without angina pectoris: Secondary | ICD-10-CM | POA: Insufficient documentation

## 2017-06-26 DIAGNOSIS — C187 Malignant neoplasm of sigmoid colon: Secondary | ICD-10-CM | POA: Diagnosis not present

## 2017-06-26 DIAGNOSIS — C189 Malignant neoplasm of colon, unspecified: Secondary | ICD-10-CM | POA: Diagnosis not present

## 2017-06-26 DIAGNOSIS — I700 Atherosclerosis of aorta: Secondary | ICD-10-CM | POA: Insufficient documentation

## 2017-06-26 NOTE — Telephone Encounter (Signed)
Oral Oncology Patient Advocate Encounter  Prior Authorization for Capecitabine has been approved.    PA# [id number] Effective dates: 06/22/2017 through 06/21/2018  Oral Oncology Clinic will continue to follow.   Anthony Knight. [name], [place] Patient [place] [phone] 06/26/2017 8:12 AM

## 2017-06-28 NOTE — Telephone Encounter (Signed)
Oral Oncology Patient Advocate Encounter  Received confirmation from [facility] that Anthony Knight will receive his initial shipment of Xeloda on 06/29/2017.    [name]. [name], [place] Patient [place] [phone] 06/28/2017 9:42 AM

## 2017-06-29 ENCOUNTER — Inpatient Hospital Stay: Payer: BLUE CROSS/BLUE SHIELD | Attending: Oncology

## 2017-06-29 ENCOUNTER — Inpatient Hospital Stay (HOSPITAL_BASED_OUTPATIENT_CLINIC_OR_DEPARTMENT_OTHER): Payer: BLUE CROSS/BLUE SHIELD | Admitting: Oncology

## 2017-06-29 ENCOUNTER — Inpatient Hospital Stay: Payer: BLUE CROSS/BLUE SHIELD | Admitting: Nutrition

## 2017-06-29 VITALS — BP 140/68 | HR 75 | Temp 97.9°F | Resp 18 | Ht 72.0 in | Wt 208.8 lb

## 2017-06-29 DIAGNOSIS — Z95828 Presence of other vascular implants and grafts: Secondary | ICD-10-CM | POA: Insufficient documentation

## 2017-06-29 DIAGNOSIS — Z800 Family history of malignant neoplasm of digestive organs: Secondary | ICD-10-CM | POA: Diagnosis not present

## 2017-06-29 DIAGNOSIS — R112 Nausea with vomiting, unspecified: Secondary | ICD-10-CM | POA: Diagnosis not present

## 2017-06-29 DIAGNOSIS — K5730 Diverticulosis of large intestine without perforation or abscess without bleeding: Secondary | ICD-10-CM | POA: Insufficient documentation

## 2017-06-29 DIAGNOSIS — Z9889 Other specified postprocedural states: Secondary | ICD-10-CM

## 2017-06-29 DIAGNOSIS — I10 Essential (primary) hypertension: Secondary | ICD-10-CM

## 2017-06-29 DIAGNOSIS — Z87442 Personal history of urinary calculi: Secondary | ICD-10-CM | POA: Diagnosis not present

## 2017-06-29 DIAGNOSIS — R21 Rash and other nonspecific skin eruption: Secondary | ICD-10-CM | POA: Diagnosis not present

## 2017-06-29 DIAGNOSIS — I700 Atherosclerosis of aorta: Secondary | ICD-10-CM

## 2017-06-29 DIAGNOSIS — C187 Malignant neoplasm of sigmoid colon: Secondary | ICD-10-CM | POA: Insufficient documentation

## 2017-06-29 DIAGNOSIS — R197 Diarrhea, unspecified: Secondary | ICD-10-CM | POA: Insufficient documentation

## 2017-06-29 DIAGNOSIS — Z9049 Acquired absence of other specified parts of digestive tract: Secondary | ICD-10-CM | POA: Diagnosis not present

## 2017-06-29 DIAGNOSIS — R0789 Other chest pain: Secondary | ICD-10-CM

## 2017-06-29 DIAGNOSIS — Z87891 Personal history of nicotine dependence: Secondary | ICD-10-CM | POA: Diagnosis not present

## 2017-06-29 DIAGNOSIS — I2510 Atherosclerotic heart disease of native coronary artery without angina pectoris: Secondary | ICD-10-CM | POA: Insufficient documentation

## 2017-06-29 DIAGNOSIS — Z5111 Encounter for antineoplastic chemotherapy: Secondary | ICD-10-CM | POA: Diagnosis not present

## 2017-06-29 MED ORDER — PALONOSETRON HCL INJECTION 0.25 MG/5ML
INTRAVENOUS | Status: AC
Start: 2017-06-29 — End: 2017-06-29
  Filled 2017-06-29: qty 5

## 2017-06-29 MED ORDER — DEXAMETHASONE SODIUM PHOSPHATE 10 MG/ML IJ SOLN
10.0000 mg | Freq: Once | INTRAMUSCULAR | Status: AC
  Administered 2017-06-29: 10 mg via INTRAVENOUS

## 2017-06-29 MED ORDER — DEXAMETHASONE SODIUM PHOSPHATE 10 MG/ML IJ SOLN
INTRAMUSCULAR | Status: AC
Start: 2017-06-29 — End: 2017-06-29
  Filled 2017-06-29: qty 1

## 2017-06-29 MED ORDER — OXALIPLATIN CHEMO INJECTION 100 MG/20ML
130.0000 mg/m2 | Freq: Once | INTRAVENOUS | Status: AC
  Administered 2017-06-29: 280 mg via INTRAVENOUS
  Filled 2017-06-29: qty 56

## 2017-06-29 MED ORDER — SODIUM CHLORIDE 0.9% FLUSH
10.0000 mL | INTRAVENOUS | Status: DC | PRN
  Administered 2017-06-29: 10 mL
  Filled 2017-06-29: qty 10

## 2017-06-29 MED ORDER — PALONOSETRON HCL INJECTION 0.25 MG/5ML
0.2500 mg | Freq: Once | INTRAVENOUS | Status: AC
  Administered 2017-06-29: 0.25 mg via INTRAVENOUS

## 2017-06-29 MED ORDER — LIDOCAINE-PRILOCAINE 2.5-2.5 % EX CREA: TOPICAL_CREAM | CUTANEOUS | 0 refills | Status: DC

## 2017-06-29 MED ORDER — DEXTROSE 5 % IV SOLN
Freq: Once | INTRAVENOUS | Status: AC
  Administered 2017-06-29: 12:00:00 via INTRAVENOUS

## 2017-06-29 MED ORDER — HEPARIN SOD (PORK) LOCK FLUSH 100 UNIT/ML IV SOLN
500.0000 [IU] | Freq: Once | INTRAVENOUS | Status: AC | PRN
  Administered 2017-06-29: 500 [IU]
  Filled 2017-06-29: qty 5

## 2017-06-29 NOTE — Progress Notes (Signed)
Nutrition Assessment  Reason for Assessment: Patient identified on Malnutrition Screening Tool for weight loss  ASSESSMENT:  57 year old male diagnosed with colon cancer. He is a patient of Dr. Benay Spice. Pt is s/p sigmoid colectomy 05/26/17. Pt has PMH of HTN.  Spoke with pt during his first chemotherapy infusion. Pt not very talkative at visit.  Pt reports he lost significant weight over the month of January but he is starting to gain it back. Per chart, pt weighed in as 219 lbs (05/22/17) dropping down to 201 lb (05/26/17). This is 8% weight loss in 1 month, significant for time frame. Pt is now 7 lb heavier than lowest weight.  Pt consumes 2 meals per day and occasional snacks.    Pt reports no nutrition impact symptoms at this time. Pt reports a recent increase in appetite and that he is eating "almost back to normal" Discussed some symptoms associated with cancer treatments and provided RD contact information in case issues arise.   Nutrition Focused Physical Exam: Deferred   Medications: Compazine, Phenergan  Labs: Reviewed  Anthropometrics:   Height: 6'0'' Weight: 208 lb (up from 201 lb 06/15/17) UBW: 215 lb BMI: 28.32 kg/m^2  Estimated Energy Needs  Kcals: 2100-2300  Protein: 115-130 grams  Fluid: >/= 2.3 L/d  NUTRITION DIAGNOSIS: Increased nutrient needs related to cancer and associated treatments as evidenced by estimated needs  INTERVENTION:  Provided support. Encouraged adequate intake to promote weight maintenance. Promoted small frequent meals to aid in intake. Questions answered.  Contact information provided.    MONITORING, EVALUATION, GOAL: Patient will tolerate adequate calories and protein to minimize weight loss throughout treatments  NEXT VISIT: To be scheduled with treatments   Parks Ranger, MS, RDN, LDN 06/29/2017 1:47 PM

## 2017-06-29 NOTE — Patient Instructions (Signed)
[facility] Discharge Instructions for Patients Receiving Chemotherapy  Today you received the following chemotherapy agents Oxaliplatin (ELOXATIN).  To help prevent nausea and vomiting after your treatment, we encourage you to take your nausea medication.   If you develop nausea and vomiting that is not controlled by your nausea medication, call the clinic.   BELOW ARE SYMPTOMS THAT SHOULD BE REPORTED IMMEDIATELY:  *FEVER GREATER THAN 100.5 F  *CHILLS WITH OR WITHOUT FEVER  NAUSEA AND VOMITING THAT IS NOT CONTROLLED WITH YOUR NAUSEA MEDICATION  *UNUSUAL SHORTNESS OF BREATH  *UNUSUAL BRUISING OR BLEEDING  TENDERNESS IN MOUTH AND THROAT WITH OR WITHOUT PRESENCE OF ULCERS  *URINARY PROBLEMS  *BOWEL PROBLEMS  UNUSUAL RASH Items with * indicate a potential emergency and should be followed up as soon as possible.  Feel free to call the clinic should you have any questions or concerns. The clinic phone number is [phone].  Please show the [facility] at check-in to the Emergency Department and triage nurse.  Oxaliplatin Injection What is this medicine? OXALIPLATIN (ox AL i PLA tin) is a chemotherapy drug. It targets fast dividing cells, like cancer cells, and causes these cells to die. This medicine is used to treat cancers of the colon and rectum, and many other cancers. This medicine may be used for other purposes; ask your health care provider or pharmacist if you have questions. COMMON BRAND NAME(S): Eloxatin What should I tell my health care provider before I take this medicine? They need to know if you have any of these conditions: -kidney disease -an unusual or allergic reaction to oxaliplatin, other chemotherapy, other medicines, foods, dyes, or preservatives -pregnant or trying to get pregnant -breast-feeding How should I use this medicine? This drug is given as an infusion into a vein. It is administered in a hospital or clinic by a specially  trained health care professional. Talk to your pediatrician regarding the use of this medicine in children. Special care may be needed. Overdosage: If you think you have taken too much of this medicine contact a poison control center or emergency room at once. NOTE: This medicine is only for you. Do not share this medicine with others. What if I miss a dose? It is important not to miss a dose. Call your doctor or health care professional if you are unable to keep an appointment. What may interact with this medicine? -medicines to increase blood counts like filgrastim, pegfilgrastim, sargramostim -probenecid -some antibiotics like amikacin, gentamicin, neomycin, polymyxin B, streptomycin, tobramycin -zalcitabine Talk to your doctor or health care professional before taking any of these medicines: -acetaminophen -aspirin -ibuprofen -ketoprofen -naproxen This list may not describe all possible interactions. Give your health care provider a list of all the medicines, herbs, non-prescription drugs, or dietary supplements you use. Also tell them if you smoke, drink alcohol, or use illegal drugs. Some items may interact with your medicine. What should I watch for while using this medicine? Your condition will be monitored carefully while you are receiving this medicine. You will need important blood work done while you are taking this medicine. This medicine can make you more sensitive to cold. Do not drink cold drinks or use ice. Cover exposed skin before coming in contact with cold temperatures or cold objects. When out in cold weather wear warm clothing and cover your mouth and nose to warm the air that goes into your lungs. Tell your doctor if you get sensitive to the cold. This drug may make  you feel generally unwell. This is not uncommon, as chemotherapy can affect healthy cells as well as cancer cells. Report any side effects. Continue your course of treatment even though you feel ill unless  your doctor tells you to stop. In some cases, you may be given additional medicines to help with side effects. Follow all directions for their use. Call your doctor or health care professional for advice if you get a fever, chills or sore throat, or other symptoms of a cold or flu. Do not treat yourself. This drug decreases your body's ability to fight infections. Try to avoid being around people who are sick. This medicine may increase your risk to bruise or bleed. Call your doctor or health care professional if you notice any unusual bleeding. Be careful brushing and flossing your teeth or using a toothpick because you may get an infection or bleed more easily. If you have any dental work done, tell your dentist you are receiving this medicine. Avoid taking products that contain aspirin, acetaminophen, ibuprofen, naproxen, or ketoprofen unless instructed by your doctor. These medicines may hide a fever. Do not become pregnant while taking this medicine. Women should inform their doctor if they wish to become pregnant or think they might be pregnant. There is a potential for serious side effects to an unborn child. Talk to your health care professional or pharmacist for more information. Do not breast-feed an infant while taking this medicine. Call your doctor or health care professional if you get diarrhea. Do not treat yourself. What side effects may I notice from receiving this medicine? Side effects that you should report to your doctor or health care professional as soon as possible: -allergic reactions like skin rash, itching or hives, swelling of the face, lips, or tongue -low blood counts - This drug may decrease the number of white blood cells, red blood cells and platelets. You may be at increased risk for infections and bleeding. -signs of infection - fever or chills, cough, sore throat, pain or difficulty passing urine -signs of decreased platelets or bleeding - bruising, pinpoint red spots  on the skin, black, tarry stools, nosebleeds -signs of decreased red blood cells - unusually weak or tired, fainting spells, lightheadedness -breathing problems -chest pain, pressure -cough -diarrhea -jaw tightness -mouth sores -nausea and vomiting -pain, swelling, redness or irritation at the injection site -pain, tingling, numbness in the hands or feet -problems with balance, talking, walking -redness, blistering, peeling or loosening of the skin, including inside the mouth -trouble passing urine or change in the amount of urine Side effects that usually do not require medical attention (report to your doctor or health care professional if they continue or are bothersome): -changes in vision -constipation -hair loss -loss of appetite -metallic taste in the mouth or changes in taste -stomach pain This list may not describe all possible side effects. Call your doctor for medical advice about side effects. You may report side effects to FDA at 1-800-FDA-1088. Where should I keep my medicine? This drug is given in a hospital or clinic and will not be stored at home. NOTE: This sheet is a summary. It may not cover all possible information. If you have questions about this medicine, talk to your doctor, pharmacist, or health care provider.  2018 Elsevier/Gold Standard (2007-12-04 17:22:47)

## 2017-06-29 NOTE — Progress Notes (Signed)
[facility] OFFICE PROGRESS NOTE   Diagnosis: Colon cancer  INTERVAL HISTORY:   Mr. Wickham returns as scheduled.  He underwent placement of a Port-A-Cath by Dr. Marlou Starks 06/22/2017.  He has noted mid anterior chest discomfort when lying on his right side.  This began following placement of the Port-A-Cath.  He otherwise feels well.  The abdominal wound has almost completely healed.  He has attended a chemotherapy teaching class.   Objective:  Vital signs in last 24 hours:  Blood pressure 140/68, pulse 75, temperature 97.9 F (36.6 C), temperature source Oral, resp. rate 18, height 6' (1.829 m), weight 208 lb 12.8 oz (94.7 kg), SpO2 100 %.     Resp: Lungs clear bilaterally, no respiratory distress Cardio: Regular rate and rhythm GI: No hepatomegaly, midline incision is almost completely healed with a 2 mm superficial opening at the inferior aspect. Vascular: No leg edema   Portacath/PICC-without erythema  Lab Results:  Lab Results  Component Value Date   WBC 8.5 06/19/2017   HGB 10.9 (L) 05/31/2017   HCT 45.0 06/19/2017   MCV 88.6 06/19/2017   PLT 448 (H) 06/19/2017   NEUTROABS 4.9 06/19/2017    CMP     Component Value Date/Time   NA 141 06/19/2017 0919   NA 143 01/30/2017 1042   NA 142 11/30/2011 1912   K 4.3 06/19/2017 0919   K 4.1 11/30/2011 1912   CL 106 06/19/2017 0919   CL 106 11/30/2011 1912   CO2 27 06/19/2017 0919   CO2 29 11/30/2011 1912   GLUCOSE 117 06/19/2017 0919   GLUCOSE 93 11/30/2011 1912   BUN 18 06/19/2017 0919   BUN 15 01/30/2017 1042   BUN 16 11/30/2011 1912   CREATININE 1.21 06/19/2017 0919   CREATININE 1.67 (H) 11/30/2011 1912   CALCIUM 9.9 06/19/2017 0919   CALCIUM 9.5 11/30/2011 1912   PROT 7.5 06/19/2017 0919   PROT 6.9 01/30/2017 1042   ALBUMIN 3.5 06/19/2017 0919   ALBUMIN 4.1 01/30/2017 1042   AST 15 06/19/2017 0919   ALT 36 06/19/2017 0919   ALKPHOS 114 06/19/2017 0919   BILITOT 0.3 06/19/2017 0919   GFRNONAA  >60 06/19/2017 0919   GFRNONAA 47 (L) 11/30/2011 1912   GFRAA >60 06/19/2017 0919   GFRAA 54 (L) 11/30/2011 1912    Lab Results  Component Value Date   CEA1 1.52 06/19/2017     Imaging:  Ct Chest Wo Contrast  Result Date: 06/26/2017 CLINICAL DATA:  57 year old male with history of colon cancer status post resection on January 4th former smoker (quit 33 years ago). Staging examination. EXAM: CT CHEST WITHOUT CONTRAST TECHNIQUE: Multidetector CT imaging of the chest was performed following the standard protocol without IV contrast. COMPARISON:  No priors. FINDINGS: Cardiovascular: Heart size is normal. There is no significant pericardial fluid, thickening or pericardial calcification. There is aortic atherosclerosis, as well as atherosclerosis of the great vessels of the mediastinum and the coronary arteries, including calcified atherosclerotic plaque in the left anterior descending coronary artery. Left subclavian Port-A-Cath with tip terminating in the superior cavoatrial junction. Mediastinum/Nodes: No pathologically enlarged mediastinal or hilar lymph nodes. Please note that accurate exclusion of hilar adenopathy is limited on noncontrast CT scans. Esophagus is unremarkable in appearance. No axillary lymphadenopathy. Lungs/Pleura: No suspicious pulmonary nodules or masses. No acute consolidative airspace disease. No pleural effusions. Upper Abdomen: Unremarkable. Musculoskeletal: There are no aggressive appearing lytic or blastic lesions noted in the visualized portions of the skeleton. IMPRESSION: 1.  No findings to suggest metastatic disease in the thorax. 2. Aortic atherosclerosis, in addition to left anterior descending coronary artery disease. Please note that although the presence of coronary artery calcium documents the presence of coronary artery disease, the severity of this disease and any potential stenosis cannot be assessed on this non-gated CT examination. Assessment for potential risk  factor modification, dietary therapy or pharmacologic therapy may be warranted, if clinically indicated. Aortic Atherosclerosis (ICD10-I70.0). Electronically Signed   By: Vinnie Langton M.D.   On: 06/26/2017 13:18    Medications: I have reviewed the patient's current medications.   Assessment/Plan: 1. Adenocarcinoma of the sigmoid colon, synchronous primary tumors separated by 2 cm, status post a sigmoid colectomy 05/26/2017 ? Stage III (T3N1) moderately differentiated adenocarcinoma, MSI-stable, no loss of mismatch repair protein expression, lymphovascular and perineural invasion noted ? Elevated preoperative CEA ? CT abdomen/pelvis 05/05/2018-wall thickening of the sigmoid colon, mesenteric nodes at the left common iliac, sigmoid diverticulosis ? CT chest 06/26/2017-negative for metastatic disease 2. Hypertension 3. Family history of colon cancer 4. High rectal polyp noted at rigid sigmoidoscopy during surgery 05/26/2017, no preoperative colonoscopy was performed 5. History of kidney stones  Disposition: Mr. Leifheit has been diagnosed with stage III colon cancer.  The plan is to begin cycle 1 of adjuvant Capox today.  He has attended a chemotherapy teaching class.  He appears stable to begin chemotherapy today.  The anterior chest discomfort may be related to the Port-A-Cath procedure.  He will contact us if the pain does not improve.  Mr. Floren will return for an office visit and cycle 2 CAPOX in 3 weeks.  15 minutes were spent with the patient today.  The majority of the time was used for counseling and coordination of care.  Betsy Coder, MD  06/29/2017  10:37 AM

## 2017-06-29 NOTE — Progress Notes (Signed)
  Oncology Nurse Navigator Documentation  Navigator Location: [facility] (06/29/17 1248)   )Navigator Encounter Type: Treatment (06/29/17 1248)                   Treatment Initiated Date: 06/29/17 (06/29/17 1248)   Treatment Phase: First Chemo Tx (06/29/17 1248) Barriers/Navigation Needs: No barriers at this time;No Questions;No Needs (06/29/17 1248)   Interventions: Psycho-social support (06/29/17 1248)  Met with [patient and wife during 1st chemo infusion. Patient states that he is eager to get through the treatments. No barriers, needs or questions voiced.           Acuity: Level 2 (06/29/17 1248)         Time Spent with Patient: 15 (06/29/17 1248)

## 2017-07-03 ENCOUNTER — Telehealth: Payer: Self-pay

## 2017-07-03 NOTE — Telephone Encounter (Signed)
Pt called to report "feeling sick for the past 2 days".Symptoms started Friday night per pt.  Pt states he's had "nausea that progressed to vomiting last night and an episode of diarrhea". Pt states he "feels better today than I have for the past two days". No current nausea/vomiting/diarrhea. Also reports "tingling in my hands, eye tearing with pressure, and jaw pain with chewing" that have all resolved. Denies mouth sores/pain and pain in hands/feet. Per Dr. Benay Spice pt to continue Xeloda pills and drink fluids. Pt voiced understanding and will call back with any worsening symptoms.

## 2017-07-11 ENCOUNTER — Other Ambulatory Visit: Payer: Self-pay | Admitting: Emergency Medicine

## 2017-07-11 DIAGNOSIS — C187 Malignant neoplasm of sigmoid colon: Secondary | ICD-10-CM

## 2017-07-11 MED ORDER — CAPECITABINE 500 MG PO TABS: 2000.0000 mg | ORAL_TABLET | Freq: Two times a day (BID) | ORAL | 0 refills | Status: DC

## 2017-07-16 ENCOUNTER — Other Ambulatory Visit: Payer: Self-pay | Admitting: Oncology

## 2017-07-17 ENCOUNTER — Telehealth: Payer: Self-pay

## 2017-07-17 NOTE — Telephone Encounter (Signed)
Spoke with pt regarding next treatment. Pt states "I want to speak with Dr. Benay Spice because I'm having a really hard time with this treatment". Pt reported "2 weeks of being sick". Reports experiencing cold sensitivity including "tingling in hands, not being able to swallow anything cold". Also reports that "I was nauseous for about 10-12 days and has some diarrhea". Pt denies any current symptoms. States "I feel good now, I've gained about 9lbs because I've been eating again". Pt also noted "bumps on chest, back, and stomach. It's not a rash, not itchy or painful". Pt to follow up with Dr. Benay Spice on Thursday as scheduled. Dr. Benay Spice made aware.

## 2017-07-20 ENCOUNTER — Inpatient Hospital Stay (HOSPITAL_BASED_OUTPATIENT_CLINIC_OR_DEPARTMENT_OTHER): Payer: BLUE CROSS/BLUE SHIELD | Admitting: Oncology

## 2017-07-20 ENCOUNTER — Telehealth: Payer: Self-pay | Admitting: Oncology

## 2017-07-20 ENCOUNTER — Inpatient Hospital Stay: Payer: BLUE CROSS/BLUE SHIELD

## 2017-07-20 VITALS — BP 139/79 | HR 80 | Temp 98.3°F | Resp 17 | Ht 72.0 in | Wt 210.7 lb

## 2017-07-20 DIAGNOSIS — R21 Rash and other nonspecific skin eruption: Secondary | ICD-10-CM

## 2017-07-20 DIAGNOSIS — Z9049 Acquired absence of other specified parts of digestive tract: Secondary | ICD-10-CM | POA: Diagnosis not present

## 2017-07-20 DIAGNOSIS — C187 Malignant neoplasm of sigmoid colon: Secondary | ICD-10-CM | POA: Diagnosis not present

## 2017-07-20 DIAGNOSIS — T451X5A Adverse effect of antineoplastic and immunosuppressive drugs, initial encounter: Secondary | ICD-10-CM

## 2017-07-20 DIAGNOSIS — I700 Atherosclerosis of aorta: Secondary | ICD-10-CM | POA: Diagnosis not present

## 2017-07-20 DIAGNOSIS — Z95828 Presence of other vascular implants and grafts: Secondary | ICD-10-CM

## 2017-07-20 DIAGNOSIS — I2510 Atherosclerotic heart disease of native coronary artery without angina pectoris: Secondary | ICD-10-CM

## 2017-07-20 DIAGNOSIS — Z9889 Other specified postprocedural states: Secondary | ICD-10-CM | POA: Diagnosis not present

## 2017-07-20 DIAGNOSIS — K5730 Diverticulosis of large intestine without perforation or abscess without bleeding: Secondary | ICD-10-CM | POA: Diagnosis not present

## 2017-07-20 DIAGNOSIS — Z87442 Personal history of urinary calculi: Secondary | ICD-10-CM

## 2017-07-20 DIAGNOSIS — R110 Nausea: Secondary | ICD-10-CM

## 2017-07-20 DIAGNOSIS — Z87891 Personal history of nicotine dependence: Secondary | ICD-10-CM | POA: Diagnosis not present

## 2017-07-20 DIAGNOSIS — R0789 Other chest pain: Secondary | ICD-10-CM | POA: Diagnosis not present

## 2017-07-20 DIAGNOSIS — Z800 Family history of malignant neoplasm of digestive organs: Secondary | ICD-10-CM | POA: Diagnosis not present

## 2017-07-20 DIAGNOSIS — R112 Nausea with vomiting, unspecified: Secondary | ICD-10-CM

## 2017-07-20 DIAGNOSIS — R197 Diarrhea, unspecified: Secondary | ICD-10-CM

## 2017-07-20 DIAGNOSIS — I10 Essential (primary) hypertension: Secondary | ICD-10-CM

## 2017-07-20 DIAGNOSIS — Z5111 Encounter for antineoplastic chemotherapy: Secondary | ICD-10-CM | POA: Diagnosis not present

## 2017-07-20 LAB — CBC WITH DIFFERENTIAL (CANCER CENTER ONLY)
BASOS ABS: 0 10*3/uL (ref 0.0–0.1)
Basophils Relative: 0 %
EOS PCT: 6 %
Eosinophils Absolute: 0.4 10*3/uL (ref 0.0–0.5)
HEMATOCRIT: 41.3 % (ref 38.4–49.9)
Hemoglobin: 14.2 g/dL (ref 13.0–17.1)
LYMPHS ABS: 2.2 10*3/uL (ref 0.9–3.3)
LYMPHS PCT: 30 %
MCH: 29.7 pg (ref 27.2–33.4)
MCHC: 34.5 g/dL (ref 32.0–36.0)
MCV: 86.2 fL (ref 79.3–98.0)
MONO ABS: 0.6 10*3/uL (ref 0.1–0.9)
Monocytes Relative: 8 %
NEUTROS ABS: 4.1 10*3/uL (ref 1.5–6.5)
Neutrophils Relative %: 56 %
PLATELETS: 290 10*3/uL (ref 140–400)
RBC: 4.79 MIL/uL (ref 4.20–5.82)
RDW: 15.3 % — ABNORMAL HIGH (ref 11.0–14.6)
WBC: 7.3 10*3/uL (ref 4.0–10.3)

## 2017-07-20 LAB — CMP (CANCER CENTER ONLY)
ALBUMIN: 3.5 g/dL (ref 3.5–5.0)
ALT: 41 U/L (ref 0–55)
ANION GAP: 15 — AB (ref 3–11)
AST: 24 U/L (ref 5–34)
Alkaline Phosphatase: 111 U/L (ref 40–150)
BUN: 12 mg/dL (ref 7–26)
CHLORIDE: 106 mmol/L (ref 98–109)
CO2: 20 mmol/L — ABNORMAL LOW (ref 22–29)
Calcium: 9.8 mg/dL (ref 8.4–10.4)
Creatinine: 1.09 mg/dL (ref 0.70–1.30)
GFR, Est AFR Am: >60 mL/min (ref >60)
GFR, Estimated: >60 mL/min (ref >60)
GLUCOSE: 157 mg/dL — AB (ref 70–140)
POTASSIUM: 3.7 mmol/L (ref 3.5–5.1)
SODIUM: 141 mmol/L (ref 136–145)
Total Bilirubin: 0.5 mg/dL (ref 0.2–1.2)
Total Protein: 7.1 g/dL (ref 6.4–8.3)

## 2017-07-20 MED ORDER — DEXTROSE 5 % IV SOLN
Freq: Once | INTRAVENOUS | Status: AC
  Administered 2017-07-20: 11:00:00 via INTRAVENOUS

## 2017-07-20 MED ORDER — OXALIPLATIN CHEMO INJECTION 100 MG/20ML
100.0000 mg/m2 | Freq: Once | INTRAVENOUS | Status: AC
  Administered 2017-07-20: 215 mg via INTRAVENOUS
  Filled 2017-07-20: qty 40

## 2017-07-20 MED ORDER — DEXAMETHASONE 4 MG PO TABS: 8.0000 mg | ORAL_TABLET | Freq: Two times a day (BID) | ORAL | 2 refills | Status: DC

## 2017-07-20 MED ORDER — PALONOSETRON HCL INJECTION 0.25 MG/5ML
0.2500 mg | Freq: Once | INTRAVENOUS | Status: AC
  Administered 2017-07-20: 0.25 mg via INTRAVENOUS

## 2017-07-20 MED ORDER — SODIUM CHLORIDE 0.9% FLUSH
10.0000 mL | INTRAVENOUS | Status: DC | PRN
  Administered 2017-07-20: 10 mL
  Filled 2017-07-20: qty 10

## 2017-07-20 MED ORDER — HEPARIN SOD (PORK) LOCK FLUSH 100 UNIT/ML IV SOLN
500.0000 [IU] | Freq: Once | INTRAVENOUS | Status: AC | PRN
  Administered 2017-07-20: 500 [IU]
  Filled 2017-07-20: qty 5

## 2017-07-20 MED ORDER — SODIUM CHLORIDE 0.9 % IV SOLN
Freq: Once | INTRAVENOUS | Status: AC
  Administered 2017-07-20: 12:00:00 via INTRAVENOUS
  Filled 2017-07-20: qty 5

## 2017-07-20 MED ORDER — SODIUM CHLORIDE 0.9% FLUSH
10.0000 mL | Freq: Once | INTRAVENOUS | Status: AC
  Administered 2017-07-20: 10 mL via INTRAVENOUS
  Filled 2017-07-20: qty 10

## 2017-07-20 MED ORDER — PALONOSETRON HCL INJECTION 0.25 MG/5ML
INTRAVENOUS | Status: AC
  Filled 2017-07-20: qty 5

## 2017-07-20 NOTE — Patient Instructions (Signed)
Implanted Port Home Guide An implanted port is a type of central line that is placed under the skin. Central lines are used to provide IV access when treatment or nutrition needs to be given through a person's veins. Implanted ports are used for long-term IV access. An implanted port may be placed because:  You need IV medicine that would be irritating to the small veins in your hands or arms.  You need long-term IV medicines, such as antibiotics.  You need IV nutrition for a long period.  You need frequent blood draws for lab tests.  You need dialysis.  Implanted ports are usually placed in the chest area, but they can also be placed in the upper arm, the abdomen, or the leg. An implanted port has two main parts:  Reservoir. The reservoir is round and will appear as a small, raised area under your skin. The reservoir is the part where a needle is inserted to give medicines or draw blood.  Catheter. The catheter is a thin, flexible tube that extends from the reservoir. The catheter is placed into a large vein. Medicine that is inserted into the reservoir goes into the catheter and then into the vein.  How will I care for my incision site? Do not get the incision site wet. Bathe or shower as directed by your health care provider. How is my port accessed? Special steps must be taken to access the port:  Before the port is accessed, a numbing cream can be placed on the skin. This helps numb the skin over the port site.  Your health care provider uses a sterile technique to access the port. ? Your health care provider must put on a mask and sterile gloves. ? The skin over your port is cleaned carefully with an antiseptic and allowed to dry. ? The port is gently pinched between sterile gloves, and a needle is inserted into the port.  Only "non-coring" port needles should be used to access the port. Once the port is accessed, a blood return should be checked. This helps ensure that the port  is in the vein and is not clogged.  If your port needs to remain accessed for a constant infusion, a clear (transparent) bandage will be placed over the needle site. The bandage and needle will need to be changed every week, or as directed by your health care provider.  Keep the bandage covering the needle clean and dry. Do not get it wet. Follow your health care provider's instructions on how to take a shower or bath while the port is accessed.  If your port does not need to stay accessed, no bandage is needed over the port.  What is flushing? Flushing helps keep the port from getting clogged. Follow your health care provider's instructions on how and when to flush the port. Ports are usually flushed with saline solution or a medicine called heparin. The need for flushing will depend on how the port is used.  If the port is used for intermittent medicines or blood draws, the port will need to be flushed: ? After medicines have been given. ? After blood has been drawn. ? As part of routine maintenance.  If a constant infusion is running, the port may not need to be flushed.  How long will my port stay implanted? The port can stay in for as long as your health care provider thinks it is needed. When it is time for the port to come out, surgery will be   done to remove it. The procedure is similar to the one performed when the port was put in. When should I seek immediate medical care? When you have an implanted port, you should seek immediate medical care if:  You notice a bad smell coming from the incision site.  You have swelling, redness, or drainage at the incision site.  You have more swelling or pain at the port site or the surrounding area.  You have a fever that is not controlled with medicine.  This information is not intended to replace advice given to you by your health care provider. Make sure you discuss any questions you have with your health care provider. Document  Released: 05/09/2005 Document Revised: 10/15/2015 Document Reviewed: 01/14/2013 Elsevier Interactive Patient Education  2017 [employer]  

## 2017-07-20 NOTE — Patient Instructions (Signed)
[facility] Discharge Instructions for Patients Receiving Chemotherapy  Today you received the following chemotherapy agents:  Oxaliplatin  To help prevent nausea and vomiting after your treatment, we encourage you to take your nausea medication as prescribed.   If you develop nausea and vomiting that is not controlled by your nausea medication, call the clinic.   BELOW ARE SYMPTOMS THAT SHOULD BE REPORTED IMMEDIATELY:  *FEVER GREATER THAN 100.5 F  *CHILLS WITH OR WITHOUT FEVER  NAUSEA AND VOMITING THAT IS NOT CONTROLLED WITH YOUR NAUSEA MEDICATION  *UNUSUAL SHORTNESS OF BREATH  *UNUSUAL BRUISING OR BLEEDING  TENDERNESS IN MOUTH AND THROAT WITH OR WITHOUT PRESENCE OF ULCERS  *URINARY PROBLEMS  *BOWEL PROBLEMS  UNUSUAL RASH Items with * indicate a potential emergency and should be followed up as soon as possible.  Feel free to call the clinic should you have any questions or concerns. The clinic phone number is [phone].  Please show the [facility] at check-in to the Emergency Department and triage nurse.    Fosaprepitant injection What is this medicine? FOSAPREPITANT (fos ap RE pi tant) is used together with other medicines to prevent nausea and vomiting caused by cancer treatment (chemotherapy). This medicine may be used for other purposes; ask your health care provider or pharmacist if you have questions. COMMON BRAND NAME(S): Emend What should I tell my health care provider before I take this medicine? They need to know if you have any of these conditions: -liver disease -an unusual or allergic reaction to fosaprepitant, aprepitant, medicines, foods, dyes, or preservatives -pregnant or trying to get pregnant -breast-feeding How should I use this medicine? This medicine is for injection into a vein. It is given by a health care professional in a hospital or clinic setting. Talk to your pediatrician regarding the use of this medicine in  children. Special care may be needed. Overdosage: If you think you have taken too much of this medicine contact a poison control center or emergency room at once. NOTE: This medicine is only for you. Do not share this medicine with others. What if I miss a dose? This does not apply. What may interact with this medicine? Do not take this medicine with any of these medicines: -cisapride -flibanserin -lomitapide -pimozide This medicine may also interact with the following medications: -diltiazem -male hormones, like estrogens or progestins and birth control pills -medicines for fungal infections like ketoconazole and itraconazole -medicines for HIV -medicines for seizures or to control epilepsy like carbamazepine or phenytoin -medicines used for sleep or anxiety disorders like alprazolam, diazepam, or midazolam -nefazodone -paroxetine -ranolazine -rifampin -some chemotherapy medications like etoposide, ifosfamide, vinblastine, vincristine -some antibiotics like clarithromycin, erythromycin, troleandomycin -steroid medicines like dexamethasone or methylprednisolone -tolbutamide -warfarin This list may not describe all possible interactions. Give your health care provider a list of all the medicines, herbs, non-prescription drugs, or dietary supplements you use. Also tell them if you smoke, drink alcohol, or use illegal drugs. Some items may interact with your medicine. What should I watch for while using this medicine? Do not take this medicine if you already have nausea and vomiting. Ask your health care provider what to do if you already have nausea. Birth control pills and other methods of hormonal contraception (for example, IUD or patch) may not work properly while you are taking this medicine. Use an extra method of birth control during treatment and for 1 month after your last dose of fosaprepitant. This medicine should not be used continuously for  a long time. Visit your doctor  or health care professional for regular check-ups. This medicine may change your liver function blood test results. What side effects may I notice from receiving this medicine? Side effects that you should report to your doctor or health care professional as soon as possible: -allergic reactions like skin rash, itching or hives, swelling of the face, lips, or tongue -breathing problems -changes in heart rhythm -high or low blood pressure -pain, redness, or irritation at site where injected -rectal bleeding -serious dizziness or disorientation, confusion -sharp or severe stomach pain -sharp pain in your leg Side effects that usually do not require medical attention (report to your doctor or health care professional if they continue or are bothersome): -constipation or diarrhea -hair loss -headache -hiccups -loss of appetite -nausea -upset stomach -tiredness This list may not describe all possible side effects. Call your doctor for medical advice about side effects. You may report side effects to FDA at 1-800-FDA-1088. Where should I keep my medicine? This drug is given in a hospital or clinic and will not be stored at home. NOTE: This sheet is a summary. It may not cover all possible information. If you have questions about this medicine, talk to your doctor, pharmacist, or health care provider.  2018 Elsevier/Gold Standard (2014-06-25 10:45:34)

## 2017-07-20 NOTE — Telephone Encounter (Signed)
Scheduled appt per 2/28 los - Patient to get an updated schedule in the tx area per Darrold Span to print out.

## 2017-07-20 NOTE — Progress Notes (Signed)
[facility] OFFICE PROGRESS NOTE   Diagnosis: Colon cancer  INTERVAL HISTORY:   Anthony Knight returns as scheduled.  He completed a first cycle of [place] ox beginning 06/29/2017.  He reports developing nausea and vomiting on day 4.  Persistent nausea following this, but no further vomiting.  He also had diarrhea beginning on day 4.  Imodium helped the diarrhea.  Cold sensitivity lasted for greater than 2 weeks.  He developed a rash over the chest and back.  No mouth sores or hand/foot pain.  No neuropathy symptoms at present.  Objective:  Vital signs in last 24 hours:  Blood pressure 139/79, pulse 80, temperature 98.3 F (36.8 C), temperature source Oral, resp. rate 17, height 6' (1.829 m), weight 210 lb 11.2 oz (95.6 kg), SpO2 98 %.    HEENT: No thrush, small healing ulcer versus abrasion at the left buccal mucosa. Resp: Lungs clear bilaterally Cardio: Regular rate and rhythm GI: No hepatomegaly, nontender healed midline incision  vascular: No leg edema  Skin: Palms without erythema, mild pustular rash over the chest and back  Portacath/PICC-without erythema  Lab Results:  Lab Results  Component Value Date   WBC 7.3 07/20/2017   HGB 10.9 (L) 05/31/2017   HCT 41.3 07/20/2017   MCV 86.2 07/20/2017   PLT 290 07/20/2017   NEUTROABS 4.1 07/20/2017    CMP     Component Value Date/Time   NA 141 07/20/2017 0859   NA 143 01/30/2017 1042   NA 142 11/30/2011 1912   K 3.7 07/20/2017 0859   K 4.1 11/30/2011 1912   CL 106 07/20/2017 0859   CL 106 11/30/2011 1912   CO2 20 (L) 07/20/2017 0859   CO2 29 11/30/2011 1912   GLUCOSE 157 (H) 07/20/2017 0859   GLUCOSE 93 11/30/2011 1912   BUN 12 07/20/2017 0859   BUN 15 01/30/2017 1042   BUN 16 11/30/2011 1912   CREATININE 1.09 07/20/2017 0859   CREATININE 1.67 (H) 11/30/2011 1912   CALCIUM 9.8 07/20/2017 0859   CALCIUM 9.5 11/30/2011 1912   PROT 7.1 07/20/2017 0859   PROT 6.9 01/30/2017 1042   ALBUMIN 3.5 07/20/2017  0859   ALBUMIN 4.1 01/30/2017 1042   AST 24 07/20/2017 0859   ALT 41 07/20/2017 0859   ALKPHOS 111 07/20/2017 0859   BILITOT 0.5 07/20/2017 0859   GFRNONAA >60 07/20/2017 0859   GFRNONAA 47 (L) 11/30/2011 1912   GFRAA >60 07/20/2017 0859   GFRAA 54 (L) 11/30/2011 1912    Lab Results  Component Value Date   CEA1 1.52 06/19/2017    No results found for: INR  Imaging:  No results found.  Medications: I have reviewed the patient's current medications.   Assessment/Plan: 1. Adenocarcinoma of the sigmoid colon, synchronous primary tumors separated by 2 cm, status post a sigmoid colectomy 05/26/2017 ? Stage III (T3N1) moderately differentiated adenocarcinoma, MSI-stable, no loss of mismatch repair protein expression, lymphovascular and perineural invasion noted ? Elevated preoperative CEA ? CT abdomen/pelvis 05/05/2018-wall thickening of the sigmoid colon, mesenteric nodes at the left common iliac, sigmoid diverticulosis ? CT chest 06/26/2017-negative for metastatic disease 2. Hypertension 3. Family history of colon cancer 4. High rectal polyp noted at rigid sigmoidoscopy during surgery 05/26/2017, no preoperative colonoscopy was performed 5. History of kidney stones      Disposition: Anthony Knight appears well today.  He completed 1 cycle of adjuvant [place] ox.  The chemotherapy was complicated by delayed nausea and diarrhea.  The rash over  the trunk may be related to Decadron or Xeloda.  The plan is to proceed with cycle 2 chemotherapy today.  We will add emend and prophylactic Decadron to the antiemetic regimen.  He will contact us for nausea or a progressive rash following the cycle.  The oxaliplatin will be dose reduced.  Anthony Knight will return for an office visit and chemotherapy in 3 weeks.  25 minutes were spent with the patient today.  The majority of the time was used for counseling and coordination of care.  Betsy Coder, MD  07/20/2017  10:29 AM

## 2017-08-06 ENCOUNTER — Other Ambulatory Visit: Payer: Self-pay | Admitting: Oncology

## 2017-08-09 ENCOUNTER — Other Ambulatory Visit: Payer: Self-pay | Admitting: *Deleted

## 2017-08-09 DIAGNOSIS — C187 Malignant neoplasm of sigmoid colon: Secondary | ICD-10-CM

## 2017-08-09 MED ORDER — CAPECITABINE 500 MG PO TABS: 2000.0000 mg | ORAL_TABLET | Freq: Two times a day (BID) | ORAL | 0 refills | Status: DC

## 2017-08-10 ENCOUNTER — Inpatient Hospital Stay: Payer: BLUE CROSS/BLUE SHIELD | Attending: Oncology

## 2017-08-10 ENCOUNTER — Encounter: Payer: Self-pay | Admitting: Nurse Practitioner

## 2017-08-10 ENCOUNTER — Inpatient Hospital Stay: Payer: BLUE CROSS/BLUE SHIELD

## 2017-08-10 ENCOUNTER — Inpatient Hospital Stay (HOSPITAL_BASED_OUTPATIENT_CLINIC_OR_DEPARTMENT_OTHER): Payer: BLUE CROSS/BLUE SHIELD | Admitting: Nurse Practitioner

## 2017-08-10 VITALS — BP 148/98 | HR 72 | Temp 97.9°F | Resp 18 | Ht 72.0 in | Wt 215.5 lb

## 2017-08-10 DIAGNOSIS — Z800 Family history of malignant neoplasm of digestive organs: Secondary | ICD-10-CM | POA: Diagnosis not present

## 2017-08-10 DIAGNOSIS — Z9221 Personal history of antineoplastic chemotherapy: Secondary | ICD-10-CM | POA: Diagnosis not present

## 2017-08-10 DIAGNOSIS — C187 Malignant neoplasm of sigmoid colon: Secondary | ICD-10-CM

## 2017-08-10 DIAGNOSIS — C775 Secondary and unspecified malignant neoplasm of intrapelvic lymph nodes: Secondary | ICD-10-CM | POA: Diagnosis not present

## 2017-08-10 DIAGNOSIS — I10 Essential (primary) hypertension: Secondary | ICD-10-CM | POA: Insufficient documentation

## 2017-08-10 DIAGNOSIS — G620 Drug-induced polyneuropathy: Secondary | ICD-10-CM | POA: Insufficient documentation

## 2017-08-10 DIAGNOSIS — N23 Unspecified renal colic: Secondary | ICD-10-CM | POA: Insufficient documentation

## 2017-08-10 DIAGNOSIS — Z95828 Presence of other vascular implants and grafts: Secondary | ICD-10-CM

## 2017-08-10 DIAGNOSIS — T451X5S Adverse effect of antineoplastic and immunosuppressive drugs, sequela: Secondary | ICD-10-CM | POA: Insufficient documentation

## 2017-08-10 DIAGNOSIS — R110 Nausea: Secondary | ICD-10-CM | POA: Insufficient documentation

## 2017-08-10 DIAGNOSIS — R21 Rash and other nonspecific skin eruption: Secondary | ICD-10-CM | POA: Diagnosis not present

## 2017-08-10 LAB — CMP (CANCER CENTER ONLY)
ALT: 61 U/L — ABNORMAL HIGH (ref 0–55)
ANION GAP: 10 (ref 3–11)
AST: 38 U/L — AB (ref 5–34)
Albumin: 3.5 g/dL (ref 3.5–5.0)
Alkaline Phosphatase: 122 U/L (ref 40–150)
BILIRUBIN TOTAL: 0.5 mg/dL (ref 0.2–1.2)
BUN: 12 mg/dL (ref 7–26)
CO2: 23 mmol/L (ref 22–29)
Calcium: 9.5 mg/dL (ref 8.4–10.4)
Chloride: 107 mmol/L (ref 98–109)
Creatinine: 0.96 mg/dL (ref 0.70–1.30)
GFR, Est AFR Am: >60 mL/min (ref >60)
GFR, Estimated: >60 mL/min (ref >60)
GLUCOSE: 79 mg/dL (ref 70–140)
POTASSIUM: 3.8 mmol/L (ref 3.5–5.1)
Sodium: 140 mmol/L (ref 136–145)
Total Protein: 7.2 g/dL (ref 6.4–8.3)

## 2017-08-10 LAB — CBC WITH DIFFERENTIAL (CANCER CENTER ONLY)
BASOS ABS: 0.1 10*3/uL (ref 0.0–0.1)
Basophils Relative: 1 %
Eosinophils Absolute: 0.3 10*3/uL (ref 0.0–0.5)
Eosinophils Relative: 4 %
HEMATOCRIT: 42.1 % (ref 38.4–49.9)
Hemoglobin: 14.4 g/dL (ref 13.0–17.1)
Lymphocytes Relative: 35 %
Lymphs Abs: 2.5 10*3/uL (ref 0.9–3.3)
MCH: 30.8 pg (ref 27.2–33.4)
MCHC: 34.2 g/dL (ref 32.0–36.0)
MCV: 90.1 fL (ref 79.3–98.0)
MONO ABS: 0.9 10*3/uL (ref 0.1–0.9)
Monocytes Relative: 12 %
NEUTROS ABS: 3.5 10*3/uL (ref 1.5–6.5)
Neutrophils Relative %: 48 %
PLATELETS: 211 10*3/uL (ref 140–400)
RBC: 4.67 MIL/uL (ref 4.20–5.82)
RDW: 17.4 % — AB (ref 11.0–14.6)
WBC Count: 7.3 10*3/uL (ref 4.0–10.3)

## 2017-08-10 MED ORDER — HEPARIN SOD (PORK) LOCK FLUSH 100 UNIT/ML IV SOLN
500.0000 [IU] | Freq: Once | INTRAVENOUS | Status: AC | PRN
Start: 2017-08-10 — End: 2017-08-10
  Administered 2017-08-10: 500 [IU]
  Filled 2017-08-10: qty 5

## 2017-08-10 MED ORDER — SODIUM CHLORIDE 0.9% FLUSH
10.0000 mL | INTRAVENOUS | Status: DC | PRN
  Administered 2017-08-10: 10 mL
  Filled 2017-08-10: qty 10

## 2017-08-10 NOTE — Progress Notes (Signed)
  [facility] OFFICE PROGRESS NOTE   Diagnosis: Colon cancer  INTERVAL HISTORY:   Mr. Alarid returns as scheduled.  He completed cycle 2 CAPOX beginning 07/20/2017.  He feels he tolerated cycle 2 less well than cycle 1.  He had persistent numbness/tingling in the fingertips and toes until yesterday.  This was not related to cold exposure.  He had nausea without vomiting for 1 week.  He had a burning sensation in his abdomen lasting 10-12 days.  He again had the skin rash.  It seems to have resolved more quickly.  He had less diarrhea.  He had "kidney pain" and difficulty initiating the urine stream for 4-5 days.  No hand or foot pain or redness.  No mouth sores.  His mouth felt "burnt".  Lips continue to "tingle".  Objective:  Vital signs in last 24 hours:  Blood pressure (!) 148/98, pulse 72, temperature 97.9 F (36.6 C), temperature source Oral, resp. rate 18, height 6' (1.829 m), weight 215 lb 8 oz (97.8 kg), SpO2 99 %.    HEENT: No thrush or ulcers. Resp: Lungs clear bilaterally. Cardio: Regular rate and rhythm. GI: Abdomen soft and nontender.  No hepatomegaly. Vascular: No leg edema. Neuro: Vibratory sense mildly decreased over the fingertips for tuning fork exam. Skin: Acne type rash over the trunk. Port-A-Cath without erythema.   Lab Results:  Lab Results  Component Value Date   WBC 7.3 08/10/2017   HGB 10.9 (L) 05/31/2017   HCT 42.1 08/10/2017   MCV 90.1 08/10/2017   PLT 211 08/10/2017   NEUTROABS 3.5 08/10/2017    Imaging:  No results found.  Medications: I have reviewed the patient's current medications.  Assessment/Plan: 1. Adenocarcinoma of the sigmoid colon, synchronous primary tumors separated by 2 cm, status post a sigmoid colectomy 05/26/2017 ? Stage III (T3N1) moderately differentiated adenocarcinoma, MSI-stable, no loss of mismatch repair protein expression, lymphovascular and perineural invasion noted ? Elevated preoperative CEA ? CT  abdomen/pelvis 05/05/2018-wall thickening of the sigmoid colon, mesenteric nodes at the left common iliac, sigmoid diverticulosis ? CT chest 06/26/2017-negative for metastatic disease ? Cycle 1 CAPOX 06/29/2017 ? Cycle 2 CAPOX 07/20/2017 2. Hypertension 3. Family history of colon cancer 4. High rectal polyp noted at rigid sigmoidoscopy during surgery 05/26/2017, no preoperative colonoscopy was performed 5. History of kidney stones  Disposition: Mr. Netz appears stable.  He has completed 2 cycles of CAPOX chemotherapy.  He is tolerating treatment poorly.  He had prolonged neuropathy symptoms involving the hands and feet, a "burning" sensation in the abdomen and urinary symptoms.  He is not sure that he wants to continue treatment.  We discussed options to include delaying cycle 3 CAPOX for an additional week, proceed with treatment today as scheduled and further dose reduce the Oxaliplatin, change treatment to the FOLFOX regimen, continue with Xeloda alone for an additional 6 cycles, discontinue treatment completely.  He declines to proceed with treatment today.  He would like to consider the different options we discussed and will contact the office tomorrow with his decision.  We will schedule further follow-up pending his decision.  Plan reviewed with Dr. Benay Spice.  25 minutes were spent face-to-face at today's visit with the majority of that time involved in counseling/coordination of care.  Ned Card ANP/GNP-BC   08/10/2017  2:46 PM

## 2017-08-15 ENCOUNTER — Telehealth: Payer: Self-pay | Admitting: Oncology

## 2017-08-15 ENCOUNTER — Telehealth: Payer: Self-pay

## 2017-08-15 NOTE — Telephone Encounter (Signed)
Scheduled appt per 3/26 sch message - patient is aware of appt date and time.  

## 2017-08-15 NOTE — Telephone Encounter (Signed)
Spoke with pt. Pt reports he has "decided to go with the Xeloda, I've already ordered it". Pt states "I just need to figure out when I'm coming back". This RN will consult with MD.   Per Lattie Haw, NP, pt to be seen on off week. Pt due to start this Thursday 08/17/17. Will get scheduled accordingly, pt voiced understanding.

## 2017-09-04 ENCOUNTER — Other Ambulatory Visit: Payer: Self-pay | Admitting: *Deleted

## 2017-09-04 ENCOUNTER — Inpatient Hospital Stay: Payer: BLUE CROSS/BLUE SHIELD

## 2017-09-04 ENCOUNTER — Inpatient Hospital Stay: Payer: BLUE CROSS/BLUE SHIELD | Attending: Oncology | Admitting: Oncology

## 2017-09-04 ENCOUNTER — Telehealth: Payer: Self-pay | Admitting: Oncology

## 2017-09-04 VITALS — BP 150/91 | HR 84 | Temp 98.3°F | Resp 18 | Ht 72.0 in | Wt 220.9 lb

## 2017-09-04 DIAGNOSIS — Z9221 Personal history of antineoplastic chemotherapy: Secondary | ICD-10-CM | POA: Insufficient documentation

## 2017-09-04 DIAGNOSIS — Z87891 Personal history of nicotine dependence: Secondary | ICD-10-CM | POA: Diagnosis not present

## 2017-09-04 DIAGNOSIS — Z87442 Personal history of urinary calculi: Secondary | ICD-10-CM | POA: Insufficient documentation

## 2017-09-04 DIAGNOSIS — C187 Malignant neoplasm of sigmoid colon: Secondary | ICD-10-CM | POA: Diagnosis not present

## 2017-09-04 DIAGNOSIS — G620 Drug-induced polyneuropathy: Secondary | ICD-10-CM

## 2017-09-04 DIAGNOSIS — T451X5S Adverse effect of antineoplastic and immunosuppressive drugs, sequela: Secondary | ICD-10-CM

## 2017-09-04 DIAGNOSIS — Z9049 Acquired absence of other specified parts of digestive tract: Secondary | ICD-10-CM | POA: Insufficient documentation

## 2017-09-04 DIAGNOSIS — I10 Essential (primary) hypertension: Secondary | ICD-10-CM | POA: Insufficient documentation

## 2017-09-04 DIAGNOSIS — Z800 Family history of malignant neoplasm of digestive organs: Secondary | ICD-10-CM | POA: Insufficient documentation

## 2017-09-04 LAB — COMPREHENSIVE METABOLIC PANEL
ALBUMIN: 3.9 g/dL (ref 3.5–5.0)
ALK PHOS: 96 U/L (ref 38–126)
ALT: 29 U/L (ref 17–63)
AST: 29 U/L (ref 15–41)
Anion gap: 12 (ref 5–15)
BILIRUBIN TOTAL: 0.9 mg/dL (ref 0.3–1.2)
BUN: 15 mg/dL (ref 6–20)
CALCIUM: 9.4 mg/dL (ref 8.9–10.3)
CO2: 20 mmol/L — AB (ref 22–32)
Chloride: 108 mmol/L (ref 101–111)
Creatinine, Ser: 1.25 mg/dL — ABNORMAL HIGH (ref 0.61–1.24)
GFR calc Af Amer: >60 mL/min (ref >60)
GFR calc non Af Amer: >60 mL/min (ref >60)
GLUCOSE: 92 mg/dL (ref 65–99)
POTASSIUM: 4.1 mmol/L (ref 3.5–5.1)
Sodium: 140 mmol/L (ref 135–145)
TOTAL PROTEIN: 6.9 g/dL (ref 6.5–8.1)

## 2017-09-04 LAB — CMP (CANCER CENTER ONLY)
ALT: 31 U/L (ref 0–55)
ANION GAP: 19 — AB (ref 3–11)
AST: 25 U/L (ref 5–34)
Albumin: 3.9 g/dL (ref 3.5–5.0)
Alkaline Phosphatase: 103 U/L (ref 40–150)
BUN: 13 mg/dL (ref 7–26)
CO2: 13 mmol/L — AB (ref 22–29)
Calcium: 9.9 mg/dL (ref 8.4–10.4)
Chloride: 107 mmol/L (ref 98–109)
Creatinine: 1.12 mg/dL (ref 0.70–1.30)
Glucose, Bld: 97 mg/dL (ref 70–140)
Potassium: 4.1 mmol/L (ref 3.5–5.1)
SODIUM: 139 mmol/L (ref 136–145)
Total Bilirubin: 0.4 mg/dL (ref 0.2–1.2)
Total Protein: 8.8 g/dL — ABNORMAL HIGH (ref 6.4–8.3)

## 2017-09-04 LAB — CBC WITH DIFFERENTIAL (CANCER CENTER ONLY)
Basophils Absolute: 0.1 10*3/uL (ref 0.0–0.1)
Basophils Relative: 1 %
EOS PCT: 3 %
Eosinophils Absolute: 0.2 10*3/uL (ref 0.0–0.5)
HCT: 44.7 % (ref 38.4–49.9)
Hemoglobin: 16.3 g/dL (ref 13.0–17.1)
LYMPHS ABS: 2.8 10*3/uL (ref 0.9–3.3)
LYMPHS PCT: 41 %
MCH: 32.8 pg (ref 27.2–33.4)
MCHC: 36.5 g/dL — ABNORMAL HIGH (ref 32.0–36.0)
MCV: 89.9 fL (ref 79.3–98.0)
Monocytes Absolute: 0.6 10*3/uL (ref 0.1–0.9)
Monocytes Relative: 8 %
NEUTROS PCT: 47 %
Neutro Abs: 3.3 10*3/uL (ref 1.5–6.5)
PLATELETS: 279 10*3/uL (ref 140–400)
RBC: 4.97 MIL/uL (ref 4.20–5.82)
RDW: 17.1 % — ABNORMAL HIGH (ref 11.0–14.6)
WBC: 6.9 10*3/uL (ref 4.0–10.3)

## 2017-09-04 MED ORDER — CAPECITABINE 500 MG PO TABS: 2000.0000 mg | ORAL_TABLET | Freq: Two times a day (BID) | ORAL | 0 refills | Status: DC

## 2017-09-04 MED ORDER — PANTOPRAZOLE SODIUM 40 MG PO TBEC: 40.0000 mg | DELAYED_RELEASE_TABLET | Freq: Every day | ORAL | 2 refills | Status: DC

## 2017-09-04 NOTE — Telephone Encounter (Signed)
Appt scheduled AVS/Calendar printed per 4/15 los °

## 2017-09-04 NOTE — Progress Notes (Signed)
[facility] OFFICE PROGRESS NOTE   Diagnosis: Colon cancer  INTERVAL HISTORY:   Anthony Knight returns as scheduled.  He began a cycle of Xeloda on 08/17/2017.  No mouth sores, nausea, or diarrhea.  He has mild numbness at the lips and fingers.  He reports a "burning "discomfort in the mid abdomen relieved when he eats.  Objective:  Vital signs in last 24 hours:  Blood pressure (!) 150/91, pulse 84, temperature 98.3 F (36.8 C), temperature source Oral, resp. rate 18, height 6' (1.829 m), weight 220 lb 14.4 oz (100.2 kg), SpO2 98 %.    HEENT: No thrush or ulcers Resp: Clear bilaterally Cardio: Regular rate and rhythm GI: No hepatomegaly, no mass, nontender Vascular: No leg edema  Skin: Palms without erythema.  Mild hyperpigmentation of the soles.  No skin breakdown.  Portacath/PICC-without erythema  Lab Results:  Lab Results  Component Value Date   WBC 6.9 09/04/2017   HGB 10.9 (L) 05/31/2017   HCT 44.7 09/04/2017   MCV 89.9 09/04/2017   PLT 279 09/04/2017   NEUTROABS 3.3 09/04/2017    CMP     Component Value Date/Time   NA 139 09/04/2017 0905   NA 143 01/30/2017 1042   NA 142 11/30/2011 1912   K 4.1 09/04/2017 0905   K 4.1 11/30/2011 1912   CL 107 09/04/2017 0905   CL 106 11/30/2011 1912   CO2 13 (L) 09/04/2017 0905   CO2 29 11/30/2011 1912   GLUCOSE 97 09/04/2017 0905   GLUCOSE 93 11/30/2011 1912   BUN 13 09/04/2017 0905   BUN 15 01/30/2017 1042   BUN 16 11/30/2011 1912   CREATININE 1.12 09/04/2017 0905   CREATININE 1.67 (H) 11/30/2011 1912   CALCIUM 9.9 09/04/2017 0905   CALCIUM 9.5 11/30/2011 1912   PROT 8.8 (H) 09/04/2017 0905   PROT 6.9 01/30/2017 1042   ALBUMIN 3.9 09/04/2017 0905   ALBUMIN 4.1 01/30/2017 1042   AST 25 09/04/2017 0905   ALT 31 09/04/2017 0905   ALKPHOS 103 09/04/2017 0905   BILITOT 0.4 09/04/2017 0905   GFRNONAA >60 09/04/2017 0905   GFRNONAA 47 (L) 11/30/2011 1912   GFRAA >60 09/04/2017 0905   GFRAA 54 (L)  11/30/2011 1912    Lab Results  Component Value Date   CEA1 1.52 06/19/2017    No results found for: INR  Imaging:  No results found.  Medications: I have reviewed the patient's current medications.   Assessment/Plan: 1. Adenocarcinoma of the sigmoid colon, synchronous primary tumors separated by 2 cm, status post a sigmoid colectomy 05/26/2017 ? Stage III (T3N1) moderately differentiated adenocarcinoma, MSI-stable, no loss of mismatch repair protein expression, lymphovascular and perineural invasion noted ? Elevated preoperative CEA ? CT abdomen/pelvis 05/05/2018-wall thickening of the sigmoid colon, mesenteric nodes at the left common iliac, sigmoid diverticulosis ? CT chest 06/26/2017-negative for metastatic disease ? Cycle 1 CAPOX 06/29/2017 ? Cycle 2 CAPOX 07/20/2017 2. Hypertension 3. Family history of colon cancer 4. High rectal polyp noted at rigid sigmoidoscopy during surgery 05/26/2017, no preoperative colonoscopy was performed 5. History of kidney stones      Disposition: Anthony Knight has completed 3 cycles of adjuvant therapy.  He declined further oxaliplatin after cycle 2.  The plan is to complete a total of 8 cycles of adjuvant systemic therapy.  The remaining cycles will include single agent capecitabine.  He will begin a trial of Protonix for the abdominal discomfort.  Anthony Knight will begin the next cycle of  capecitabine on 09/07/2017.  He will return for office and lab visit in 3 weeks.  The Port-A-Cath will remain in place for now.  The Port-A-Cath will be flushed when he returns in 3 weeks.  Anthony Knight plans to schedule a completion colonoscopy.   25 minutes were spent with the patient today.  The majority of the time was used for counseling and coordination of care.  Betsy Coder, MD  09/04/2017  1:54 PM

## 2017-09-25 ENCOUNTER — Telehealth: Payer: Self-pay | Admitting: Nurse Practitioner

## 2017-09-25 ENCOUNTER — Encounter: Payer: Self-pay | Admitting: Nurse Practitioner

## 2017-09-25 ENCOUNTER — Inpatient Hospital Stay: Payer: BLUE CROSS/BLUE SHIELD | Attending: Oncology

## 2017-09-25 ENCOUNTER — Inpatient Hospital Stay (HOSPITAL_BASED_OUTPATIENT_CLINIC_OR_DEPARTMENT_OTHER): Payer: BLUE CROSS/BLUE SHIELD | Admitting: Nurse Practitioner

## 2017-09-25 ENCOUNTER — Inpatient Hospital Stay: Payer: BLUE CROSS/BLUE SHIELD

## 2017-09-25 ENCOUNTER — Other Ambulatory Visit: Payer: Self-pay | Admitting: Emergency Medicine

## 2017-09-25 VITALS — BP 135/97 | HR 77 | Temp 98.1°F | Resp 18 | Ht 72.0 in | Wt 222.6 lb

## 2017-09-25 DIAGNOSIS — C187 Malignant neoplasm of sigmoid colon: Secondary | ICD-10-CM | POA: Insufficient documentation

## 2017-09-25 DIAGNOSIS — R432 Parageusia: Secondary | ICD-10-CM | POA: Diagnosis not present

## 2017-09-25 DIAGNOSIS — R7989 Other specified abnormal findings of blood chemistry: Secondary | ICD-10-CM | POA: Insufficient documentation

## 2017-09-25 DIAGNOSIS — Z8719 Personal history of other diseases of the digestive system: Secondary | ICD-10-CM | POA: Insufficient documentation

## 2017-09-25 DIAGNOSIS — Z95828 Presence of other vascular implants and grafts: Secondary | ICD-10-CM

## 2017-09-25 DIAGNOSIS — Z800 Family history of malignant neoplasm of digestive organs: Secondary | ICD-10-CM | POA: Insufficient documentation

## 2017-09-25 DIAGNOSIS — R970 Elevated carcinoembryonic antigen [CEA]: Secondary | ICD-10-CM | POA: Diagnosis not present

## 2017-09-25 DIAGNOSIS — R208 Other disturbances of skin sensation: Secondary | ICD-10-CM

## 2017-09-25 DIAGNOSIS — R42 Dizziness and giddiness: Secondary | ICD-10-CM | POA: Diagnosis not present

## 2017-09-25 DIAGNOSIS — I10 Essential (primary) hypertension: Secondary | ICD-10-CM | POA: Diagnosis not present

## 2017-09-25 DIAGNOSIS — Z87442 Personal history of urinary calculi: Secondary | ICD-10-CM | POA: Diagnosis not present

## 2017-09-25 DIAGNOSIS — R531 Weakness: Secondary | ICD-10-CM | POA: Diagnosis not present

## 2017-09-25 DIAGNOSIS — K5900 Constipation, unspecified: Secondary | ICD-10-CM | POA: Insufficient documentation

## 2017-09-25 DIAGNOSIS — Z79899 Other long term (current) drug therapy: Secondary | ICD-10-CM | POA: Insufficient documentation

## 2017-09-25 LAB — CBC WITH DIFFERENTIAL (CANCER CENTER ONLY)
BASOS PCT: 1 %
Basophils Absolute: 0 10*3/uL (ref 0.0–0.1)
EOS ABS: 0.2 10*3/uL (ref 0.0–0.5)
Eosinophils Relative: 4 %
HCT: 42.1 % (ref 38.4–49.9)
HEMOGLOBIN: 15 g/dL (ref 13.0–17.1)
LYMPHS ABS: 2.5 10*3/uL (ref 0.9–3.3)
Lymphocytes Relative: 37 %
MCH: 32.7 pg (ref 27.2–33.4)
MCHC: 35.6 g/dL (ref 32.0–36.0)
MCV: 91.7 fL (ref 79.3–98.0)
Monocytes Absolute: 0.5 10*3/uL (ref 0.1–0.9)
Monocytes Relative: 8 %
NEUTROS PCT: 50 %
Neutro Abs: 3.4 10*3/uL (ref 1.5–6.5)
Platelet Count: 235 10*3/uL (ref 140–400)
RBC: 4.59 MIL/uL (ref 4.20–5.82)
RDW: 17 % — ABNORMAL HIGH (ref 11.0–14.6)
WBC: 6.7 10*3/uL (ref 4.0–10.3)

## 2017-09-25 LAB — CMP (CANCER CENTER ONLY)
ALT: 48 U/L (ref 0–55)
ANION GAP: 14 — AB (ref 3–11)
AST: 30 U/L (ref 5–34)
Albumin: 3.8 g/dL (ref 3.5–5.0)
Alkaline Phosphatase: 97 U/L (ref 40–150)
BUN: 12 mg/dL (ref 7–26)
CHLORIDE: 106 mmol/L (ref 98–109)
CO2: 17 mmol/L — ABNORMAL LOW (ref 22–29)
CREATININE: 1.12 mg/dL (ref 0.70–1.30)
Calcium: 9.6 mg/dL (ref 8.4–10.4)
GFR, Estimated: >60 mL/min (ref >60)
Glucose, Bld: 169 mg/dL — ABNORMAL HIGH (ref 70–140)
Potassium: 3.8 mmol/L (ref 3.5–5.1)
Sodium: 137 mmol/L (ref 136–145)
Total Bilirubin: 0.6 mg/dL (ref 0.2–1.2)
Total Protein: 7.6 g/dL (ref 6.4–8.3)

## 2017-09-25 MED ORDER — SODIUM CHLORIDE 0.9% FLUSH
10.0000 mL | INTRAVENOUS | Status: DC | PRN
  Administered 2017-09-25: 10 mL
  Filled 2017-09-25: qty 10

## 2017-09-25 MED ORDER — CAPECITABINE 500 MG PO TABS: 2000.0000 mg | ORAL_TABLET | Freq: Two times a day (BID) | ORAL | 0 refills | Status: DC

## 2017-09-25 MED ORDER — HEPARIN SOD (PORK) LOCK FLUSH 100 UNIT/ML IV SOLN
500.0000 [IU] | Freq: Once | INTRAVENOUS | Status: AC | PRN
  Administered 2017-09-25: 500 [IU]
  Filled 2017-09-25: qty 5

## 2017-09-25 NOTE — Telephone Encounter (Signed)
Scheduled appt per 5/6 los - Gave patient aVS and calender per los.

## 2017-09-25 NOTE — Progress Notes (Addendum)
  [facility] OFFICE PROGRESS NOTE   Diagnosis: Colon cancer  INTERVAL HISTORY:   Anthony Knight returns as scheduled.  He completed another cycle of Xeloda beginning 09/09/2017.  He denies nausea/vomiting.  No mouth sores.  He continues to note a burning sensation on his tongue.  No ulcers.  Mild alteration in taste.  No hand or foot pain or redness.  He notes tingling in his hands when he grips something tightly.  Last week he had a few episodes of diarrhea.  Objective:  Vital signs in last 24 hours:  Blood pressure (!) 135/97, pulse 77, temperature 98.1 F (36.7 C), temperature source Oral, resp. rate 18, height 6' (1.829 m), weight 222 lb 9.6 oz (101 kg), SpO2 98 %.    HEENT: No thrush or ulcers. Resp: Lungs clear bilaterally. Cardio: Regular rate and rhythm. GI: Abdomen soft and nontender.  No hepatomegaly. Vascular: No leg edema.  Skin: Palms with mild erythema.  No skin breakdown. Port-A-Cath without erythema.   Lab Results:  Lab Results  Component Value Date   WBC 6.7 09/25/2017   HGB 15.0 09/25/2017   HCT 42.1 09/25/2017   MCV 91.7 09/25/2017   PLT 235 09/25/2017   NEUTROABS 3.4 09/25/2017    Imaging:  No results found.  Medications: I have reviewed the patient's current medications.  Assessment/Plan: 1. Adenocarcinoma of the sigmoid colon, synchronous primary tumors separated by 2 cm, status post a sigmoid colectomy 05/26/2017 ? Stage III (T3N1) moderately differentiated adenocarcinoma, MSI-stable, no loss of mismatch repair protein expression, lymphovascular and perineural invasion noted ? Elevated preoperative CEA ? CT abdomen/pelvis 05/05/2018-wall thickening of the sigmoid colon, mesenteric nodes at the left common iliac, sigmoid diverticulosis ? CT chest 06/26/2017-negative for metastatic disease ? Cycle 1 CAPOX 06/29/2017 ? Cycle 2 CAPOX 07/20/2017 ? Cycle of Xeloda 08/17/2017 ? Cycle of Xeloda 09/09/2017 ? Cycle of Xeloda  09/30/2017 2. Hypertension 3. Family history of colon cancer 4. High rectal polyp noted at rigid sigmoidoscopy during surgery 05/26/2017, no preoperative colonoscopy was performed 5. History of kidney stones   Disposition: Anthony Knight appears stable.  He has completed 4 cycles of adjuvant therapy, CAPOX x2 and single agent Xeloda x2.  Plan to proceed with another cycle of Xeloda as scheduled beginning 09/30/2017.  The abnormal sensation involving his tongue may be related to Xeloda or residual effect of Oxaliplatin.  Continue to monitor for now.  He is working with the gastroenterology office to schedule a completion colonoscopy.  He will return for lab and follow-up on 10/18/2017.  He will contact the office in the interim with any problems.  Patient seen with Dr. Benay Spice.    Ned Card ANP/GNP-BC   09/25/2017  10:42 AM  This was a shared visit with Ned Card.  Anthony Knight was interviewed and examined.  He appears to be tolerating the adjuvant Xeloda well.  It is unclear whether the tongue sensation is related to Xeloda or oxaliplatin.  He understands there is no guarantee the symptoms will resolve with further therapy.  We will consider dose reducing the Xeloda if the oral symptoms worsen.  Julieanne Manson, MD

## 2017-10-12 DIAGNOSIS — R42 Dizziness and giddiness: Secondary | ICD-10-CM | POA: Diagnosis not present

## 2017-10-12 DIAGNOSIS — R0789 Other chest pain: Secondary | ICD-10-CM | POA: Diagnosis not present

## 2017-10-12 DIAGNOSIS — I10 Essential (primary) hypertension: Secondary | ICD-10-CM | POA: Diagnosis not present

## 2017-10-18 ENCOUNTER — Inpatient Hospital Stay: Payer: BLUE CROSS/BLUE SHIELD

## 2017-10-18 ENCOUNTER — Telehealth: Payer: Self-pay | Admitting: Oncology

## 2017-10-18 ENCOUNTER — Telehealth: Payer: Self-pay | Admitting: Emergency Medicine

## 2017-10-18 ENCOUNTER — Other Ambulatory Visit: Payer: Self-pay | Admitting: *Deleted

## 2017-10-18 ENCOUNTER — Inpatient Hospital Stay: Payer: BLUE CROSS/BLUE SHIELD | Admitting: Oncology

## 2017-10-18 VITALS — BP 151/100 | HR 84 | Temp 97.8°F | Resp 20 | Ht 72.0 in | Wt 227.1 lb

## 2017-10-18 DIAGNOSIS — Z8719 Personal history of other diseases of the digestive system: Secondary | ICD-10-CM

## 2017-10-18 DIAGNOSIS — R531 Weakness: Secondary | ICD-10-CM | POA: Diagnosis not present

## 2017-10-18 DIAGNOSIS — R42 Dizziness and giddiness: Secondary | ICD-10-CM | POA: Diagnosis not present

## 2017-10-18 DIAGNOSIS — Z800 Family history of malignant neoplasm of digestive organs: Secondary | ICD-10-CM

## 2017-10-18 DIAGNOSIS — R7989 Other specified abnormal findings of blood chemistry: Secondary | ICD-10-CM

## 2017-10-18 DIAGNOSIS — Z79899 Other long term (current) drug therapy: Secondary | ICD-10-CM

## 2017-10-18 DIAGNOSIS — I10 Essential (primary) hypertension: Secondary | ICD-10-CM | POA: Diagnosis not present

## 2017-10-18 DIAGNOSIS — R970 Elevated carcinoembryonic antigen [CEA]: Secondary | ICD-10-CM | POA: Diagnosis not present

## 2017-10-18 DIAGNOSIS — Z87442 Personal history of urinary calculi: Secondary | ICD-10-CM | POA: Diagnosis not present

## 2017-10-18 DIAGNOSIS — K5900 Constipation, unspecified: Secondary | ICD-10-CM | POA: Diagnosis not present

## 2017-10-18 DIAGNOSIS — C187 Malignant neoplasm of sigmoid colon: Secondary | ICD-10-CM

## 2017-10-18 DIAGNOSIS — R208 Other disturbances of skin sensation: Secondary | ICD-10-CM | POA: Diagnosis not present

## 2017-10-18 DIAGNOSIS — R432 Parageusia: Secondary | ICD-10-CM | POA: Diagnosis not present

## 2017-10-18 LAB — CBC WITH DIFFERENTIAL (CANCER CENTER ONLY)
BASOS ABS: 0.1 10*3/uL (ref 0.0–0.1)
BASOS PCT: 1 %
EOS ABS: 0.3 10*3/uL (ref 0.0–0.5)
Eosinophils Relative: 4 %
HCT: 44.6 % (ref 38.4–49.9)
HEMOGLOBIN: 15.8 g/dL (ref 13.0–17.1)
Lymphocytes Relative: 41 %
Lymphs Abs: 2.7 10*3/uL (ref 0.9–3.3)
MCH: 33 pg (ref 27.2–33.4)
MCHC: 35.4 g/dL (ref 32.0–36.0)
MCV: 93.1 fL (ref 79.3–98.0)
Monocytes Absolute: 0.5 10*3/uL (ref 0.1–0.9)
Monocytes Relative: 7 %
NEUTROS PCT: 47 %
Neutro Abs: 3.1 10*3/uL (ref 1.5–6.5)
Platelet Count: 216 10*3/uL (ref 140–400)
RBC: 4.79 MIL/uL (ref 4.20–5.82)
RDW: 16.3 % — ABNORMAL HIGH (ref 11.0–14.6)
WBC: 6.6 10*3/uL (ref 4.0–10.3)

## 2017-10-18 LAB — CMP (CANCER CENTER ONLY)
ALT: 124 U/L — AB (ref 0–55)
ANION GAP: 18 — AB (ref 3–11)
AST: 80 U/L — ABNORMAL HIGH (ref 5–34)
Albumin: 3.8 g/dL (ref 3.5–5.0)
Alkaline Phosphatase: 95 U/L (ref 40–150)
BUN: 14 mg/dL (ref 7–26)
CO2: 16 mmol/L — ABNORMAL LOW (ref 22–29)
CREATININE: 1.23 mg/dL (ref 0.70–1.30)
Calcium: 9.5 mg/dL (ref 8.4–10.4)
Chloride: 104 mmol/L (ref 98–109)
Glucose, Bld: 172 mg/dL — ABNORMAL HIGH (ref 70–140)
Potassium: 4.2 mmol/L (ref 3.5–5.1)
Sodium: 138 mmol/L (ref 136–145)
TOTAL PROTEIN: 8 g/dL (ref 6.4–8.3)
Total Bilirubin: 0.5 mg/dL (ref 0.2–1.2)

## 2017-10-18 NOTE — Progress Notes (Signed)
  [facility] OFFICE PROGRESS NOTE   Diagnosis: Colon cancer  INTERVAL HISTORY:   Anthony Knight returns as scheduled.  He completed another cycle of Xeloda beginning 09/30/2017.  No mouth sores, hand/foot pain, or diarrhea.  He reports constipation.  Good appetite.  He is concerned that he continues to gain weight. He has felt dizzy and "weak "over the past week.  This sometimes occurs when he goes from sitting to standing.  No vertigo.    Objective:  Vital signs in last 24 hours:  Blood pressure (!) 151/100, pulse 84, temperature 97.8 F (36.6 C), temperature source Oral, resp. rate 20, height 6' (1.829 m), weight 227 lb 1.6 oz (103 kg), SpO2 98 %.    HEENT: No thrush or ulcers Resp: Lungs clear bilaterally Cardio: Regular rate and rhythm GI: No hepatomegaly, nontender Vascular: No leg edema  Skin: Palms and soles without erythema Neurologic: Alert, the motor exam appears intact in the upper and lower extremities, finger-to-nose testing is normal, the gait is unremarkable, mild difficulty with heel-to-toe walking  Portacath/PICC-without erythema  Lab Results:  Lab Results  Component Value Date   WBC 6.6 10/18/2017   HGB 15.8 10/18/2017   HCT 44.6 10/18/2017   MCV 93.1 10/18/2017   PLT 216 10/18/2017   NEUTROABS 3.1 10/18/2017    CMP  Lab Results  Component Value Date   NA 138 10/18/2017   K 4.2 10/18/2017   CL 104 10/18/2017   CO2 16 (L) 10/18/2017   GLUCOSE 172 (H) 10/18/2017   BUN 14 10/18/2017   CREATININE 1.23 10/18/2017   CALCIUM 9.5 10/18/2017   PROT 8.0 10/18/2017   ALBUMIN 3.8 10/18/2017   AST 80 (H) 10/18/2017   ALT 124 (H) 10/18/2017   ALKPHOS 95 10/18/2017   BILITOT 0.5 10/18/2017   GFRNONAA >60 10/18/2017   GFRAA >60 10/18/2017    Lab Results  Component Value Date   CEA1 1.52 06/19/2017     Medications: I have reviewed the patient's current medications.   Assessment/Plan: 1. Adenocarcinoma of the sigmoid colon, synchronous  primary tumors separated by 2 cm, status post a sigmoid colectomy 05/26/2017 ? Stage III (T3N1) moderately differentiated adenocarcinoma, MSI-stable, no loss of mismatch repair protein expression, lymphovascular and perineural invasion noted ? Elevated preoperative CEA ? CT abdomen/pelvis 05/05/2018-wall thickening of the sigmoid colon, mesenteric nodes at the left common iliac, sigmoid diverticulosis ? CT chest 06/26/2017-negative for metastatic disease ? Cycle 1 CAPOX 06/29/2017 ? Cycle 2 CAPOX 07/20/2017 ? Cycle of Xeloda 08/17/2017 ? Cycle of Xeloda 09/09/2017 ? Cycle of Xeloda 09/30/2017 2. Hypertension 3. Family history of colon cancer 4. High rectal polyp noted at rigid sigmoidoscopy during surgery 05/26/2017, no preoperative colonoscopy was performed 5. History of kidney stones  Disposition: Anthony Knight has completed a total of 5 cycles of adjuvant therapy.  He feels weak and "dizzy "today.  The etiology of his symptoms is unclear.  We decided to hold Xeloda for an extra week.  Liver enzymes are mildly elevated.  He will return for a chemistry panel prior to the next cycle of Xeloda.  He will contact us if the dizziness has not improved next week.  I recommended he increase his fluid intake.  25 minutes were spent with the patient today.  The majority of the time was used for counseling and coordination of care. Betsy Coder, MD  10/18/2017  8:22 PM

## 2017-10-18 NOTE — Telephone Encounter (Addendum)
Pt verbalized understanding of this note. scheduling message sent for labs on 6/5 @ 8am.   ----- Message from Ladell Pier, MD sent at 10/18/2017  3:00 PM EDT ----- Please call patient on the liver enzymes are mildly elevated, do not drink alcohol, drink increased fluids, repeat cmet 1 week, call if the "dizziness "is not better

## 2017-10-18 NOTE — Telephone Encounter (Signed)
Scheduled appt per 5/29 los - per patient request a Monday appt - gave AVS

## 2017-10-25 ENCOUNTER — Other Ambulatory Visit: Payer: Self-pay | Admitting: Nurse Practitioner

## 2017-10-25 ENCOUNTER — Inpatient Hospital Stay: Payer: BLUE CROSS/BLUE SHIELD | Attending: Oncology

## 2017-10-25 DIAGNOSIS — R740 Nonspecific elevation of levels of transaminase and lactic acid dehydrogenase [LDH]: Secondary | ICD-10-CM

## 2017-10-25 DIAGNOSIS — C187 Malignant neoplasm of sigmoid colon: Secondary | ICD-10-CM | POA: Diagnosis not present

## 2017-10-25 DIAGNOSIS — R7401 Elevation of levels of liver transaminase levels: Secondary | ICD-10-CM

## 2017-10-25 LAB — CMP (CANCER CENTER ONLY)
ALBUMIN: 3.8 g/dL (ref 3.5–5.0)
ALK PHOS: 99 U/L (ref 40–150)
ALT: 48 U/L (ref 0–55)
AST: 25 U/L (ref 5–34)
Anion gap: 12 — ABNORMAL HIGH (ref 3–11)
BUN: 26 mg/dL (ref 7–26)
CALCIUM: 9.4 mg/dL (ref 8.4–10.4)
CO2: 21 mmol/L — AB (ref 22–29)
Chloride: 105 mmol/L (ref 98–109)
Creatinine: 1.49 mg/dL — ABNORMAL HIGH (ref 0.70–1.30)
GFR, Est AFR Am: 59 mL/min — ABNORMAL LOW (ref >60)
GFR, Estimated: 51 mL/min — ABNORMAL LOW (ref >60)
GLUCOSE: 142 mg/dL — AB (ref 70–140)
Potassium: 3.9 mmol/L (ref 3.5–5.1)
SODIUM: 138 mmol/L (ref 136–145)
Total Bilirubin: 0.8 mg/dL (ref 0.2–1.2)
Total Protein: 6.9 g/dL (ref 6.4–8.3)

## 2017-10-26 ENCOUNTER — Telehealth: Payer: Self-pay | Admitting: Emergency Medicine

## 2017-10-26 NOTE — Telephone Encounter (Addendum)
Pt verbalized understanding of this. Pt states he is on vacation right now and will not be able to start back his xeloda until next week. Md made aware of this.   ----- Message from Ladell Pier, MD sent at 10/25/2017  1:51 PM EDT ----- Please call patient, liver enzymes are better, renal function is mildly diminished-has been low in the past, proceed with Xeloda as scheduled, follow-up as scheduled, be sure you are drinking adequate amounts of fluid

## 2017-10-27 ENCOUNTER — Other Ambulatory Visit: Payer: Self-pay | Admitting: Emergency Medicine

## 2017-10-27 DIAGNOSIS — C187 Malignant neoplasm of sigmoid colon: Secondary | ICD-10-CM

## 2017-10-27 MED ORDER — CAPECITABINE 500 MG PO TABS: 2000.0000 mg | ORAL_TABLET | Freq: Two times a day (BID) | ORAL | 0 refills | Status: DC

## 2017-11-02 ENCOUNTER — Encounter (HOSPITAL_COMMUNITY): Payer: Self-pay | Admitting: *Deleted

## 2017-11-02 ENCOUNTER — Emergency Department (HOSPITAL_COMMUNITY): Payer: BLUE CROSS/BLUE SHIELD

## 2017-11-02 ENCOUNTER — Emergency Department (HOSPITAL_COMMUNITY)
Admission: EM | Admit: 2017-11-02 | Discharge: 2017-11-02 | Disposition: A | Discharge status: Home / Self Care | Payer: BLUE CROSS/BLUE SHIELD | Attending: Emergency Medicine | Admitting: Emergency Medicine

## 2017-11-02 ENCOUNTER — Other Ambulatory Visit: Payer: Self-pay

## 2017-11-02 DIAGNOSIS — Z79899 Other long term (current) drug therapy: Secondary | ICD-10-CM | POA: Diagnosis not present

## 2017-11-02 DIAGNOSIS — Z7982 Long term (current) use of aspirin: Secondary | ICD-10-CM | POA: Insufficient documentation

## 2017-11-02 DIAGNOSIS — N201 Calculus of ureter: Secondary | ICD-10-CM | POA: Diagnosis not present

## 2017-11-02 DIAGNOSIS — R109 Unspecified abdominal pain: Secondary | ICD-10-CM | POA: Diagnosis not present

## 2017-11-02 DIAGNOSIS — N1330 Unspecified hydronephrosis: Secondary | ICD-10-CM | POA: Diagnosis not present

## 2017-11-02 DIAGNOSIS — Z87891 Personal history of nicotine dependence: Secondary | ICD-10-CM | POA: Insufficient documentation

## 2017-11-02 DIAGNOSIS — N132 Hydronephrosis with renal and ureteral calculous obstruction: Secondary | ICD-10-CM

## 2017-11-02 DIAGNOSIS — R1032 Left lower quadrant pain: Secondary | ICD-10-CM | POA: Diagnosis not present

## 2017-11-02 DIAGNOSIS — I10 Essential (primary) hypertension: Secondary | ICD-10-CM | POA: Insufficient documentation

## 2017-11-02 LAB — URINALYSIS, ROUTINE W REFLEX MICROSCOPIC
Bilirubin Urine: NEGATIVE
Glucose, UA: NEGATIVE mg/dL
Ketones, ur: NEGATIVE mg/dL
Leukocytes, UA: NEGATIVE
Nitrite: NEGATIVE
PROTEIN: 30 mg/dL — AB
SPECIFIC GRAVITY, URINE: 1.014 (ref 1.005–1.030)
pH: 8 (ref 5.0–8.0)

## 2017-11-02 LAB — COMPREHENSIVE METABOLIC PANEL
ALK PHOS: 81 U/L (ref 38–126)
ALT: 46 U/L (ref 17–63)
ANION GAP: 12 (ref 5–15)
AST: 37 U/L (ref 15–41)
Albumin: 4 g/dL (ref 3.5–5.0)
BUN: 18 mg/dL (ref 6–20)
CALCIUM: 9.9 mg/dL (ref 8.9–10.3)
CHLORIDE: 106 mmol/L (ref 101–111)
CO2: 21 mmol/L — AB (ref 22–32)
Creatinine, Ser: 1.51 mg/dL — ABNORMAL HIGH (ref 0.61–1.24)
GFR calc non Af Amer: 50 mL/min — ABNORMAL LOW (ref >60)
GFR, EST AFRICAN AMERICAN: 58 mL/min — AB (ref >60)
Glucose, Bld: 136 mg/dL — ABNORMAL HIGH (ref 65–99)
Potassium: 4.4 mmol/L (ref 3.5–5.1)
SODIUM: 139 mmol/L (ref 135–145)
Total Bilirubin: 1.2 mg/dL (ref 0.3–1.2)
Total Protein: 6.9 g/dL (ref 6.5–8.1)

## 2017-11-02 LAB — CBC
HEMATOCRIT: 45.1 % (ref 39.0–52.0)
HEMOGLOBIN: 16.2 g/dL (ref 13.0–17.0)
MCH: 32.2 pg (ref 26.0–34.0)
MCHC: 35.9 g/dL (ref 30.0–36.0)
MCV: 89.7 fL (ref 78.0–100.0)
Platelets: 303 10*3/uL (ref 150–400)
RBC: 5.03 MIL/uL (ref 4.22–5.81)
RDW: 14.3 % (ref 11.5–15.5)
WBC: 12 10*3/uL — ABNORMAL HIGH (ref 4.0–10.5)

## 2017-11-02 LAB — LIPASE, BLOOD: LIPASE: 46 U/L (ref 11–51)

## 2017-11-02 MED ORDER — FENTANYL CITRATE (PF) 100 MCG/2ML IJ SOLN
100.0000 ug | Freq: Once | INTRAMUSCULAR | Status: AC
  Administered 2017-11-02: 100 ug via INTRAVENOUS
  Filled 2017-11-02: qty 2

## 2017-11-02 MED ORDER — IOHEXOL 300 MG/ML  SOLN
100.0000 mL | Freq: Once | INTRAMUSCULAR | Status: AC | PRN
  Administered 2017-11-02: 100 mL via INTRAVENOUS

## 2017-11-02 MED ORDER — TAMSULOSIN HCL 0.4 MG PO CAPS: 0.4000 mg | ORAL_CAPSULE | Freq: Every day | ORAL | 0 refills | Status: DC

## 2017-11-02 MED ORDER — KETOROLAC TROMETHAMINE 30 MG/ML IJ SOLN
15.0000 mg | Freq: Once | INTRAMUSCULAR | Status: AC
  Administered 2017-11-02: 15 mg via INTRAVENOUS
  Filled 2017-11-02: qty 1

## 2017-11-02 MED ORDER — ONDANSETRON HCL 4 MG/2ML IJ SOLN
4.0000 mg | Freq: Once | INTRAMUSCULAR | Status: AC
Start: 2017-11-02 — End: 2017-11-02
  Administered 2017-11-02: 4 mg via INTRAVENOUS
  Filled 2017-11-02: qty 2

## 2017-11-02 MED ORDER — HYDROMORPHONE HCL 2 MG/ML IJ SOLN
1.0000 mg | Freq: Once | INTRAMUSCULAR | Status: AC
  Administered 2017-11-02: 1 mg via INTRAVENOUS
  Filled 2017-11-02: qty 1

## 2017-11-02 MED ORDER — OXYCODONE-ACETAMINOPHEN 5-325 MG PO TABS: 1.0000 | ORAL_TABLET | Freq: Three times a day (TID) | ORAL | 0 refills | Status: DC | PRN

## 2017-11-02 NOTE — ED Provider Notes (Signed)
[place] EMERGENCY DEPARTMENT Provider Note   CSN: [id number] Arrival date & time: 11/02/17  0228     History   Chief Complaint Chief Complaint  Patient presents with  . Abdominal Pain    HPI Anthony Knight is a 57 y.o. male.  The history is provided by the patient and the spouse.  Abdominal Pain   This is a new problem. Episode onset: Prior to arrival. The problem occurs constantly. The problem has been rapidly worsening. The pain is associated with a previous surgery. The pain is located in the LLQ. The pain is severe. Associated symptoms include nausea. Pertinent negatives include fever, diarrhea, vomiting and dysuria. The symptoms are aggravated by certain positions and palpation. Nothing relieves the symptoms.   Patient with history of colon cancer s/p colon resection, currently on chemotherapy, presents with acute onset of left lower quadrant pain.  He reports intense nausea.  No back pain.  No dysuria.  He was otherwise at his baseline prior to pain.  He is not expanses pain previously. Past Medical History:  Diagnosis Date  . Cancer [facility])    colon cancer  . History of kidney stones    early 2018  . Hypertension   . Stricture of sigmoid colon [facility])     Patient Active Problem List   Diagnosis Date Noted  . Port-A-Cath in place 08/10/2017  . Cancer of sigmoid colon ([place]) 06/15/2017  . Colonic stricture ([place]) 05/26/2017  . Headache 01/26/2017  . Essential hypertension 07/07/2015  . Lightheadedness 07/07/2015  . BPH (benign prostatic hyperplasia) 02/18/2015    Past Surgical History:  Procedure Laterality Date  . LAPAROSCOPIC SIGMOID COLECTOMY N/A 05/26/2017   Procedure: LAPAROSCOPIC ASSISTED SIGMOID COLECTOMY;  Surgeon: Jovita Kussmaul, MD;  Location: [place];  Service: General;  Laterality: N/A;  ERAS PATHWAY  . PORTACATH PLACEMENT Left 06/22/2017   Procedure: INSERTION PORT-A-CATH;  Surgeon: Jovita Kussmaul, MD;  Location: [place];   Service: General;  Laterality: Left;  . rectal tear     Per patient report  . SIGMOIDOSCOPY N/A 05/26/2017   Procedure: RIGID SIGMOIDOSCOPY;  Surgeon: Jovita Kussmaul, MD;  Location: [place];  Service: General;  Laterality: N/A;  . URETERAL STENT PLACEMENT  2002   kidney stones removal as well        Home Medications    Prior to Admission medications   Medication Sig Start Date End Date Taking? Authorizing Provider  aspirin EC 81 MG tablet Take 1 tablet (81 mg total) by mouth daily. 02/18/15  Yes Cook, Jayce G, DO  capecitabine (XELODA) 500 MG tablet Take 4 tablets (2,000 mg total) by mouth 2 (two) times daily after a meal. Take for 14 days on, 7 days off, repeat every 21 days 10/27/17  Yes Ladell Pier, MD  losartan (COZAAR) 100 MG tablet Take 100 mg by mouth daily.    Yes [provider]  dexamethasone (DECADRON) 4 MG tablet Take 2 tablets (8 mg total) by mouth 2 (two) times daily with a meal. For 2 days. Begin Day 3 of chemo cycle (Saturday). Patient not taking: Reported on 10/18/2017 07/20/17   Ladell Pier, MD  lidocaine-prilocaine (EMLA) cream Apply to port site one hour prior to use. Do not rub in, cover with plastic. Patient not taking: Reported on 11/02/2017 06/29/17   Ladell Pier, MD  oxyCODONE (OXY IR/ROXICODONE) 5 MG immediate release tablet Take 1 tablet (5 mg total) by mouth every 6 (  six) hours as needed for moderate pain. Patient not taking: Reported on 10/18/2017 06/22/17   Autumn Messing III, MD  oxyCODONE-acetaminophen (PERCOCET/ROXICET) 5-325 MG tablet Take 1 tablet by mouth every 6 (six) hours as needed for moderate pain or severe pain. Patient not taking: Reported on 10/18/2017 06/03/17   Autumn Messing III, MD  pantoprazole (PROTONIX) 40 MG tablet Take 1 tablet (40 mg total) by mouth daily. Patient not taking: Reported on 10/18/2017 09/04/17   Ladell Pier, MD  prochlorperazine (COMPAZINE) 10 MG tablet Take 1 tablet (10 mg total) by mouth every 6 (six) hours as  needed for nausea or vomiting. Patient not taking: Reported on 10/18/2017 06/20/17   Ladell Pier, MD  promethazine (PHENERGAN) 12.5 MG tablet Take 1-2 tablets (12.5-25 mg total) by mouth every 6 (six) hours as needed for nausea. Patient not taking: Reported on 10/18/2017 06/03/17   Jovita Kussmaul, MD    Family History Family History  Problem Relation Age of Onset  . Cancer Father        prostate  . Cancer Maternal Grandfather        lung and colon  . Cancer Maternal Uncle        colon  . Cancer Maternal Uncle        spleen  . Cancer Maternal Uncle     Social History Social History   Tobacco Use  . Smoking status: Former Smoker    Last attempt to quit: 02/17/1985    Years since quitting: 32.7  . Smokeless tobacco: Never Used  Substance Use Topics  . Alcohol use: Yes    Alcohol/week: 0.0 - 0.6 oz    Comment: very rare  . Drug use: No     Allergies   Patient has no known allergies.   Review of Systems Review of Systems  Constitutional: Negative for fever.  Gastrointestinal: Positive for abdominal pain and nausea. Negative for diarrhea and vomiting.  Genitourinary: Negative for dysuria and testicular pain.  All other systems reviewed and are negative.    Physical Exam Updated Vital Signs BP (!) 155/102   Pulse 83   Temp 98.7 F (37.1 C) (Oral)   Resp (!) 22   SpO2 95%   Physical Exam CONSTITUTIONAL: Well developed/well nourished, uncomfortable appearing. HEAD: Normocephalic/atraumatic EYES: EOMI ENMT: Mucous membranes moist NECK: supple no meningeal signs CV: S1/S2 noted, no murmurs/rubs/gallops noted LUNGS: Lungs are clear to auscultation bilaterally, no apparent distress ABDOMEN: soft, moderate left lower quadrant tenderness, no rebound or guarding, bowel sounds noted throughout abdomen, no abdominal wall hernia noted GU:no cva tenderness NEURO: Pt is awake/alert/appropriate, moves all extremitiesx4.  No facial droop.   EXTREMITIES: pulses  normal/equal, full ROM SKIN: warm, color normal PSYCH: no abnormalities of mood noted, alert and oriented to situation   ED Treatments / Results  Labs (all labs ordered are listed, but only abnormal results are displayed) Labs Reviewed  COMPREHENSIVE METABOLIC PANEL - Abnormal; Notable for the following components:      Result Value   CO2 21 (*)    Glucose, Bld 136 (*)    Creatinine, Ser 1.51 (*)    GFR calc non Af Amer 50 (*)    GFR calc Af Amer 58 (*)    All other components within normal limits  CBC - Abnormal; Notable for the following components:   WBC 12.0 (*)    All other components within normal limits  URINALYSIS, ROUTINE W REFLEX MICROSCOPIC - Abnormal; Notable for the following components:  APPearance HAZY (*)    Hgb urine dipstick LARGE (*)    Protein, ur 30 (*)    RBC / HPF >50 (*)    Bacteria, UA RARE (*)    All other components within normal limits  LIPASE, BLOOD    EKG None  Radiology Ct Abdomen Pelvis W Contrast  Result Date: 11/02/2017 CLINICAL DATA:  Left lower quadrant abdominal pain. Diverticulitis suspected. EXAM: CT ABDOMEN AND PELVIS WITH CONTRAST TECHNIQUE: Multidetector CT imaging of the abdomen and pelvis was performed using the standard protocol following bolus administration of intravenous contrast. CONTRAST:  120mL OMNIPAQUE IOHEXOL 300 MG/ML  SOLN COMPARISON:  CT 05/30/2017 FINDINGS: Lower chest: Mild hypoventilatory changes at the lung bases. No pleural fluid. Hepatobiliary: Diffusely decreased hepatic density consistent with steatosis. Focal fatty sparing adjacent to the gallbladder fossa, and altered altered segmental perfusion within segment eight. No discrete focal lesion. Gallbladder is partially distended. No calcified gallstone. No biliary dilatation. Pancreas: No ductal dilatation or inflammation. Spleen: Normal in size without focal abnormality. Adrenals/Urinary Tract: Normal adrenal glands. 5 mm obstructing stone in the distal left ureter  just proximal to the ureterovesicular junction with mild to moderate proximal hydroureteronephrosis. Moderate perinephric edema. Heterogeneous renal enhancement with delayed excretion on excretory phase imaging. Small cysts in the lower left kidney. No right hydronephrosis. Right ureter is decompressed. Urinary bladder is partially distended without stone. Stomach/Bowel: Stomach is within normal limits. Appendix appears normal. No evidence of bowel wall thickening, distention, or inflammatory changes. Mild sigmoid colonic diverticulosis without diverticulitis. Enteric chain suture in the sigmoid without associated abnormal soft tissue density. Vascular/Lymphatic: Aortic atherosclerosis. No aneurysm. Portal vein is patent. No enlarged abdominal or pelvic lymph nodes. Reproductive: Enlarged prostate gland spanning 6.1 cm. Other: No free air, free fluid, or intra-abdominal fluid collection. Postsurgical changes the anterior abdominal wall. Minimal fat in both inguinal canals. Musculoskeletal: There are no acute or suspicious osseous abnormalities. IMPRESSION: 1. Obstructing 5 mm stone in the distal left ureter with moderate hydronephrosis. 2. Sigmoid colonic diverticulosis without diverticulitis. 3. Hepatic steatosis. Electronically Signed   By: Jeb Levering M.D.   On: 11/02/2017 05:05    Procedures Procedures   Medications Ordered in ED Medications  fentaNYL (SUBLIMAZE) injection 100 mcg (100 mcg Intravenous Given 11/02/17 0427)  ondansetron (ZOFRAN) injection 4 mg (4 mg Intravenous Given 11/02/17 0427)  iohexol (OMNIPAQUE) 300 MG/ML solution 100 mL (100 mLs Intravenous Contrast Given 11/02/17 0448)  fentaNYL (SUBLIMAZE) injection 100 mcg (100 mcg Intravenous Given 11/02/17 0513)  ketorolac (TORADOL) 30 MG/ML injection 15 mg (15 mg Intravenous Given 11/02/17 0517)  HYDROmorphone (DILAUDID) injection 1 mg (1 mg Intravenous Given 11/02/17 8299)     Initial Impression / Assessment and Plan / ED Course  I  have reviewed the triage vital signs and the nursing notes.  Pertinent labs results that were available during my care of the patient were reviewed by me and considered in my medical decision making (see chart for details).     4:40 AM Patient with sudden onset of left lower quadrant pain that he has never experience before.  We will proceed with CT imaging due to history of sigmoid colectomy as well as colon cancer   Pt required multiple doses of IV narcotics, but he is now improved.  He is requesting discharge home.  Short course of narcotics ordered.  Refer to urology.  Due to size of stone, will add on Flomax. Patient appropriate for discharge.  Narcotic database reviewed and considered in decision making  Final Clinical Impressions(s) / ED Diagnoses   Final diagnoses:  Ureteral stone with hydronephrosis    ED Discharge Orders        Ordered    oxyCODONE-acetaminophen (PERCOCET) 5-325 MG tablet  Every 8 hours PRN     11/02/17 0640    tamsulosin (FLOMAX) 0.4 MG CAPS capsule  Daily after supper     11/02/17 0640       Ripley Fraise, MD 11/02/17 [phone]

## 2017-11-02 NOTE — ED Notes (Signed)
ED Provider at bedside. 

## 2017-11-02 NOTE — ED Triage Notes (Signed)
Pt is currently being treated with chemo for colon cancer. Pt had sudden onset of LLQ pain tonight. Writhing in pain in triage, c/o nausea. Hx of kidney stones, reports pain is much worse than previous stones

## 2017-11-09 ENCOUNTER — Telehealth: Payer: Self-pay

## 2017-11-09 ENCOUNTER — Telehealth: Payer: Self-pay | Admitting: Oncology

## 2017-11-09 NOTE — Telephone Encounter (Signed)
Returned call to pt regarding appts Pt states "Dr. Benay Spice postponed my treatment one week, so when I am scheduled to come back I'll still be in the middle of my treatment. Do we need to move that date"? Per Dr. Benay Spice, ok to move appt to July 1st. Pt voiced understanding.

## 2017-11-09 NOTE — Telephone Encounter (Signed)
Scheduled appt per 6/20 sch message - pt is aware of appt date and time.

## 2017-11-13 ENCOUNTER — Inpatient Hospital Stay: Payer: BLUE CROSS/BLUE SHIELD | Admitting: Oncology

## 2017-11-13 ENCOUNTER — Inpatient Hospital Stay: Payer: BLUE CROSS/BLUE SHIELD

## 2017-11-15 ENCOUNTER — Other Ambulatory Visit: Payer: Self-pay | Admitting: Oncology

## 2017-11-15 DIAGNOSIS — C187 Malignant neoplasm of sigmoid colon: Secondary | ICD-10-CM

## 2017-11-20 ENCOUNTER — Encounter: Payer: Self-pay | Admitting: Nurse Practitioner

## 2017-11-20 ENCOUNTER — Inpatient Hospital Stay: Payer: BLUE CROSS/BLUE SHIELD | Attending: Oncology | Admitting: Nurse Practitioner

## 2017-11-20 ENCOUNTER — Inpatient Hospital Stay: Payer: BLUE CROSS/BLUE SHIELD

## 2017-11-20 VITALS — BP 132/89 | HR 71 | Temp 98.0°F | Resp 17 | Ht 72.0 in | Wt 225.1 lb

## 2017-11-20 DIAGNOSIS — Z800 Family history of malignant neoplasm of digestive organs: Secondary | ICD-10-CM | POA: Insufficient documentation

## 2017-11-20 DIAGNOSIS — K621 Rectal polyp: Secondary | ICD-10-CM | POA: Insufficient documentation

## 2017-11-20 DIAGNOSIS — R7989 Other specified abnormal findings of blood chemistry: Secondary | ICD-10-CM | POA: Diagnosis not present

## 2017-11-20 DIAGNOSIS — I10 Essential (primary) hypertension: Secondary | ICD-10-CM

## 2017-11-20 DIAGNOSIS — N201 Calculus of ureter: Secondary | ICD-10-CM | POA: Diagnosis not present

## 2017-11-20 DIAGNOSIS — Z87442 Personal history of urinary calculi: Secondary | ICD-10-CM | POA: Insufficient documentation

## 2017-11-20 DIAGNOSIS — Z8719 Personal history of other diseases of the digestive system: Secondary | ICD-10-CM | POA: Insufficient documentation

## 2017-11-20 DIAGNOSIS — C187 Malignant neoplasm of sigmoid colon: Secondary | ICD-10-CM | POA: Diagnosis not present

## 2017-11-20 DIAGNOSIS — Z79899 Other long term (current) drug therapy: Secondary | ICD-10-CM | POA: Insufficient documentation

## 2017-11-20 LAB — CBC WITH DIFFERENTIAL (CANCER CENTER ONLY)
BASOS ABS: 0.1 10*3/uL (ref 0.0–0.1)
Basophils Relative: 1 %
Eosinophils Absolute: 0.3 10*3/uL (ref 0.0–0.5)
Eosinophils Relative: 4 %
HEMATOCRIT: 45.1 % (ref 38.4–49.9)
HEMOGLOBIN: 15.7 g/dL (ref 13.0–17.1)
LYMPHS ABS: 2.9 10*3/uL (ref 0.9–3.3)
LYMPHS PCT: 41 %
MCH: 32.3 pg (ref 27.2–33.4)
MCHC: 34.8 g/dL (ref 32.0–36.0)
MCV: 92.8 fL (ref 79.3–98.0)
Monocytes Absolute: 0.6 10*3/uL (ref 0.1–0.9)
Monocytes Relative: 9 %
NEUTROS ABS: 3.2 10*3/uL (ref 1.5–6.5)
Neutrophils Relative %: 45 %
Platelet Count: 272 10*3/uL (ref 140–400)
RBC: 4.86 MIL/uL (ref 4.20–5.82)
RDW: 14.6 % (ref 11.0–14.6)
WBC: 7.1 10*3/uL (ref 4.0–10.3)

## 2017-11-20 LAB — CMP (CANCER CENTER ONLY)
ALT: 38 U/L (ref 0–44)
ANION GAP: 12 (ref 5–15)
AST: 22 U/L (ref 15–41)
Albumin: 4.1 g/dL (ref 3.5–5.0)
Alkaline Phosphatase: 98 U/L (ref 38–126)
BUN: 17 mg/dL (ref 6–20)
CHLORIDE: 107 mmol/L (ref 98–111)
CO2: 21 mmol/L — ABNORMAL LOW (ref 22–32)
Calcium: 9.8 mg/dL (ref 8.9–10.3)
Creatinine: 1.21 mg/dL (ref 0.61–1.24)
GFR, Est AFR Am: >60 mL/min (ref >60)
Glucose, Bld: 111 mg/dL — ABNORMAL HIGH (ref 70–99)
POTASSIUM: 4.4 mmol/L (ref 3.5–5.1)
SODIUM: 140 mmol/L (ref 135–145)
TOTAL PROTEIN: 7.6 g/dL (ref 6.5–8.1)
Total Bilirubin: 0.5 mg/dL (ref 0.3–1.2)

## 2017-11-20 MED ORDER — CAPECITABINE 500 MG PO TABS: 2000.0000 mg | ORAL_TABLET | Freq: Two times a day (BID) | ORAL | 0 refills | Status: DC

## 2017-11-20 NOTE — Progress Notes (Addendum)
  [facility] OFFICE PROGRESS NOTE   Diagnosis: Colon cancer  INTERVAL HISTORY:   Anthony Knight returns as scheduled.  He began cycle 6 adjuvant Xeloda 11/06/2017.  He denies nausea/vomiting.  No mouth sores.  No diarrhea.  No hand or foot redness.  He periodically notes discomfort on the hands and feet.  He continues to have blurred vision intermittently.  He notes vision is worse with the left eye.  No diplopia.  No unusual headaches.  He continues to have intermittent lightheadedness/dizziness.  He reports good fluid intake.  He was seen in the emergency department for evaluation of abdominal pain on 11/02/2017.  CT abdomen/pelvis showed an obstructing 5 mm stone in the distal left ureter with moderate hydronephrosis.  He reports the pain has resolved.  Objective:  Vital signs in last 24 hours:  Blood pressure 132/89, pulse 71, temperature 98 F (36.7 C), temperature source Oral, resp. rate 17, height 6' (1.829 m), weight 225 lb 1.6 oz (102.1 kg), SpO2 98 %.    HEENT: No thrush or ulcers.  Eyes without conjunctival erythema. Resp: Lungs clear bilaterally. Cardio: Regular rate and rhythm. GI: Abdomen soft and nontender.  No hepatomegaly. Vascular: No leg edema. Neuro: Alert and oriented.  Pupils equal round and reactive to light.  Extraocular movements intact. Skin: Palms with mild erythema.  No skin breakdown. Port-A-Cath without erythema.   Lab Results:  Lab Results  Component Value Date   WBC 7.1 11/20/2017   HGB 15.7 11/20/2017   HCT 45.1 11/20/2017   MCV 92.8 11/20/2017   PLT 272 11/20/2017   NEUTROABS 3.2 11/20/2017    Imaging:  No results found.  Medications: I have reviewed the patient's current medications.  Assessment/Plan: 1. Adenocarcinoma of the sigmoid colon, synchronous primary tumors separated by 2 cm, status post a sigmoid colectomy 05/26/2017 ? Stage III (T3N1) moderately differentiated adenocarcinoma, MSI-stable, no loss of mismatch repair  protein expression, lymphovascular and perineural invasion noted ? Elevated preoperative CEA ? CT abdomen/pelvis 05/05/2018-wall thickening of the sigmoid colon, mesenteric nodes at the left common iliac, sigmoid diverticulosis ? CT chest 06/26/2017-negative for metastatic disease ? Cycle 1 CAPOX 06/29/2017 ? Cycle 2 CAPOX 07/20/2017 ? Cycle of Xeloda 08/17/2017 ? Cycle of Xeloda 09/09/2017 ? Cycle of Xeloda 09/30/2017 ? Cycle of Xeloda 11/06/2017 2. Hypertension 3. Family history of colon cancer 4. High rectal polyp noted at rigid sigmoidoscopy during surgery 05/26/2017, no preoperative colonoscopy was performed 5. History of kidney stones   Disposition: Anthony Knight appears stable.  He has completed 6 cycles of systemic therapy.  Plan to proceed with another cycle of Xeloda beginning 11/27/2017.  We reviewed today's labs.  Transaminases and creatinine are both in normal range.  He will return for lab, Port-A-Cath flush and follow-up on 12/12/2017.  He will contact the office in the interim with any problems.  Patient seen with Anthony Knight.    Anthony Knight ANP/GNP-BC   11/20/2017  11:24 AM This was a shared visit with Anthony Knight.  Anthony Knight will complete 2 additional cycles of capecitabine. We will recommend he follow-up with urology to evaluate the ureter stone.  Anthony Manson, MD

## 2017-11-21 ENCOUNTER — Telehealth: Payer: Self-pay | Admitting: Emergency Medicine

## 2017-11-21 ENCOUNTER — Telehealth: Payer: Self-pay | Admitting: Oncology

## 2017-11-21 NOTE — Telephone Encounter (Signed)
Scheduled appt per 7/1 los - left message with appt date and time. -

## 2017-11-21 NOTE — Telephone Encounter (Addendum)
Awaiting return call from pt regarding this note   ----- Message from Owens Shark, NP sent at 11/20/2017  4:14 PM EDT ----- Please let him know the note from the emergency department indicated he should follow-up with urology regarding the kidney stone. Does he have a urologist?

## 2017-11-24 ENCOUNTER — Other Ambulatory Visit: Payer: Self-pay | Admitting: *Deleted

## 2017-11-24 ENCOUNTER — Other Ambulatory Visit: Payer: Self-pay | Admitting: Medical Oncology

## 2017-11-24 ENCOUNTER — Telehealth: Payer: Self-pay | Admitting: Medical Oncology

## 2017-11-24 DIAGNOSIS — C187 Malignant neoplasm of sigmoid colon: Secondary | ICD-10-CM

## 2017-11-24 MED ORDER — CAPECITABINE 500 MG PO TABS: 2000.0000 mg | ORAL_TABLET | Freq: Two times a day (BID) | ORAL | 0 refills | Status: DC

## 2017-11-24 NOTE — Telephone Encounter (Signed)
[facility] did not receive xeloda rx . Sent again and receipt confirmed.

## 2017-11-24 NOTE — Progress Notes (Signed)
Resent Xeloda RX. Patient to start medication tomorrow. RX was sent to local pharmacy instead of specialty pharmacy. Patient notified. Spoke to [place] at [facility] to expedite delivery.

## 2017-12-08 ENCOUNTER — Other Ambulatory Visit: Payer: Self-pay

## 2017-12-12 ENCOUNTER — Other Ambulatory Visit: Payer: Self-pay | Admitting: *Deleted

## 2017-12-12 ENCOUNTER — Inpatient Hospital Stay: Payer: BLUE CROSS/BLUE SHIELD | Admitting: Oncology

## 2017-12-12 ENCOUNTER — Inpatient Hospital Stay: Payer: BLUE CROSS/BLUE SHIELD

## 2017-12-12 DIAGNOSIS — C187 Malignant neoplasm of sigmoid colon: Secondary | ICD-10-CM

## 2017-12-12 MED ORDER — CAPECITABINE 500 MG PO TABS: 2000.0000 mg | ORAL_TABLET | Freq: Two times a day (BID) | ORAL | 0 refills | Status: DC

## 2017-12-17 NOTE — Progress Notes (Signed)
[place]  [phone] [phone]  Clinic Follow up Note   Patient Care Team: Coral Spikes, DO as PCP - General (Family Medicine) 12/18/2017  DIAGNOSIS: Colon Cancer   INTERVAL HISTORY: Mr. Jarnigan returns for follow up as scheduled. He completed last Xeloda cycle 1 week ago, he plans to begin this next cycle today. He takes 2000 mg BID for 2 weeks. He has occasional mild nausea and diarrhea while taking pills, he does not require supportive medication. Hands are slightly tender, denies redness, skin breakdown, or neuropathy. No recent mucositis, fever, chills, cough, chest pain, or dyspnea.    REVIEW OF SYSTEMS:   Constitutional: Denies fevers, chills or abnormal weight loss  Ears, nose, mouth, throat, and face: Denies mucositis  Respiratory: Denies cough, dyspnea or wheezes Cardiovascular: Denies palpitation, chest discomfort or lower extremity swelling Gastrointestinal:  Denies vomiting, constipation, heartburn or change in bowel habits (+) occasional mild nausea (+) periodic diarrhea  Skin: Denies abnormal skin rashes or hand/foot redness  Neurological:Denies numbness, tingling  All other systems were reviewed with the patient and are negative.  MEDICAL HISTORY:  Past Medical History:  Diagnosis Date  . Cancer [facility])    colon cancer  . History of kidney stones    early 2018  . Hypertension   . Stricture of sigmoid colon ([place])     SURGICAL HISTORY: Past Surgical History:  Procedure Laterality Date  . LAPAROSCOPIC SIGMOID COLECTOMY N/A 05/26/2017   Procedure: LAPAROSCOPIC ASSISTED SIGMOID COLECTOMY;  Surgeon: Jovita Kussmaul, MD;  Location: [place];  Service: General;  Laterality: N/A;  ERAS PATHWAY  . PORTACATH PLACEMENT Left 06/22/2017   Procedure: INSERTION PORT-A-CATH;  Surgeon: Jovita Kussmaul, MD;  Location: [place];  Service: General;  Laterality: Left;  . rectal tear     Per patient report  . SIGMOIDOSCOPY N/A  05/26/2017   Procedure: RIGID SIGMOIDOSCOPY;  Surgeon: Jovita Kussmaul, MD;  Location: [place];  Service: General;  Laterality: N/A;  . URETERAL STENT PLACEMENT  2002   kidney stones removal as well    I have reviewed the social history and family history with the patient and they are unchanged from previous note.  ALLERGIES:  has No Known Allergies.  MEDICATIONS:  Current Outpatient Medications  Medication Sig Dispense Refill  . aspirin EC 81 MG tablet Take 1 tablet (81 mg total) by mouth daily. 90 tablet 3  . capecitabine (XELODA) 500 MG tablet Take 4 tablets (2,000 mg total) by mouth 2 (two) times daily after a meal. Take for 14 days on, 7 days off, repeat every 21 days 96 tablet 0  . losartan (COZAAR) 100 MG tablet Take 100 mg by mouth daily.     . pantoprazole (PROTONIX) 40 MG tablet Take 1 tablet (40 mg total) by mouth daily. 30 tablet 2  . lidocaine-prilocaine (EMLA) cream Apply to port site one hour prior to use. Do not rub in, cover with plastic. 30 g 0  . oxyCODONE-acetaminophen (PERCOCET) 5-325 MG tablet Take 1 tablet by mouth every 8 (eight) hours as needed for severe pain. 10 tablet 0   No current facility-administered medications for this visit.    Facility-Administered Medications Ordered in Other Visits  Medication Dose Route Frequency Provider Last Rate Last Dose  . gadopentetate dimeglumine (MAGNEVIST) injection 20 mL  20 mL Intravenous Once PRN Melvenia Beam, MD        PHYSICAL EXAMINATION: ECOG PERFORMANCE STATUS: 1 - Symptomatic but completely  ambulatory  Vitals:   12/18/17 0935  BP: (!) 144/90  Pulse: 77  Resp: 18  Temp: 98 F (36.7 C)  SpO2: 99%   Filed Weights   12/18/17 0935  Weight: 225 lb 9.6 oz (102.3 kg)    GENERAL:alert, no distress and comfortable SKIN: no rashes or significant lesions. No palmar-plantar erythema   EYES:  sclera clear OROPHARYNX:no thrush or ulcers  LYMPH:  no palpable cervical or supraclavicular lymphadenopathy  LUNGS:  clear to auscultation with normal breathing effort HEART: regular rate & rhythm; no lower extremity edema ABDOMEN:abdomen soft, non-tender and normal bowel sounds Musculoskeletal:no cyanosis of digits and no clubbing  NEURO: alert & oriented x 3 with fluent speech, no focal motor/sensory deficits PAC without erythema   LABORATORY DATA:  I have reviewed the data as listed CBC Latest Ref Rng & Units 12/18/2017 11/20/2017 11/02/2017  WBC 4.0 - 10.3 K/uL 6.7 7.1 12.0(H)  Hemoglobin 13.0 - 17.1 g/dL 15.1 15.7 16.2  Hematocrit 38.4 - 49.9 % 44.0 45.1 45.1  Platelets 140 - 400 K/uL 252 272 303     CMP Latest Ref Rng & Units 12/18/2017 11/20/2017 11/02/2017  Glucose 70 - 99 mg/dL 109(H) 111(H) 136(H)  BUN 6 - 20 mg/dL '13 17 18  ' Creatinine 0.61 - 1.24 mg/dL 1.10 1.21 1.51(H)  Sodium 135 - 145 mmol/L 139 140 139  Potassium 3.5 - 5.1 mmol/L 4.0 4.4 4.4  Chloride 98 - 111 mmol/L 108 107 106  CO2 22 - 32 mmol/L 23 21(L) 21(L)  Calcium 8.9 - 10.3 mg/dL 9.3 9.8 9.9  Total Protein 6.5 - 8.1 g/dL 7.1 7.6 6.9  Total Bilirubin 0.3 - 1.2 mg/dL 0.5 0.5 1.2  Alkaline Phos 38 - 126 U/L 81 98 81  AST 15 - 41 U/L 28 22 37  ALT 0 - 44 U/L 50(H) 38 46      RADIOGRAPHIC STUDIES: I have personally reviewed the radiological images as listed and agreed with the findings in the report. No results found.   ASSESSMENT & PLAN:  1. Adenocarcinoma of the sigmoid colon, synchronous primary tumors separated by 2 cm, status post a sigmoid colectomy 05/26/2017 ? Stage III (T3N1) moderately differentiated adenocarcinoma, MSI-stable, no loss of mismatch repair protein expression, lymphovascular and perineural invasion noted ? Elevated preoperative CEA ? CT abdomen/pelvis 05/05/2018-wall thickening of the sigmoid colon, mesenteric nodes at the left common iliac, sigmoid diverticulosis ? CT chest 06/26/2017-negative for metastatic disease ? Cycle 1 CAPOX 06/29/2017 ? Cycle 2 CAPOX 07/20/2017 ? Cycle of Xeloda 08/17/2017 ? Cycle  of Xeloda 09/09/2017 ? Cycle of Xeloda 09/30/2017 ? Cycle of Xeloda 11/06/2017 ? Cycle of Xeloda 11/27/2017  ? Cycle of Xeldoa 12/18/2017  2. Hypertension 3. Family history of colon cancer 4. High rectal polyp noted at rigid sigmoidoscopy during surgery 05/26/2017, no preoperative colonoscopy was performed 5. History of kidney stones  Mr. Trageser appears stable. He completed cycle 7 Xeloda on 12/11/17. He tolerated well with mild nausea and diarrhea. Labs reviewed, CBC is unremarkable. Creatinine in normal range. ALT slightly elevated, 50. Last adequate to proceed with last Xeloda cycle. Proceed with cycle 8 Xeloda 2000 mg AM/PM x14 days. Will send refill today, enough for 12 days as patient has 2 days leftover from previous fills. He will return for lab and f/u with Dr. Benay Spice in 3-4 weeks.   The plan was reviewed with Dr. Benay Spice.    Orders Placed This Encounter  Procedures  . CBC with Differential ([place])  Standing Status:   Future    Standing Expiration Date:   12/19/2018  . CMP ([place] only)    Standing Status:   Future    Standing Expiration Date:   12/19/2018   All questions were answered. The patient knows to call the clinic with any problems, questions or concerns. No barriers to learning was detected.     Alla Feeling, NP 12/18/17

## 2017-12-18 ENCOUNTER — Inpatient Hospital Stay: Payer: BLUE CROSS/BLUE SHIELD

## 2017-12-18 ENCOUNTER — Inpatient Hospital Stay (HOSPITAL_BASED_OUTPATIENT_CLINIC_OR_DEPARTMENT_OTHER): Payer: BLUE CROSS/BLUE SHIELD | Admitting: Nurse Practitioner

## 2017-12-18 ENCOUNTER — Encounter: Payer: Self-pay | Admitting: Nurse Practitioner

## 2017-12-18 ENCOUNTER — Telehealth: Payer: Self-pay

## 2017-12-18 VITALS — BP 144/90 | HR 77 | Temp 98.0°F | Resp 18 | Ht 72.0 in | Wt 225.6 lb

## 2017-12-18 DIAGNOSIS — Z800 Family history of malignant neoplasm of digestive organs: Secondary | ICD-10-CM | POA: Diagnosis not present

## 2017-12-18 DIAGNOSIS — C187 Malignant neoplasm of sigmoid colon: Secondary | ICD-10-CM

## 2017-12-18 DIAGNOSIS — R7989 Other specified abnormal findings of blood chemistry: Secondary | ICD-10-CM

## 2017-12-18 DIAGNOSIS — Z79899 Other long term (current) drug therapy: Secondary | ICD-10-CM

## 2017-12-18 DIAGNOSIS — K621 Rectal polyp: Secondary | ICD-10-CM | POA: Diagnosis not present

## 2017-12-18 DIAGNOSIS — I10 Essential (primary) hypertension: Secondary | ICD-10-CM

## 2017-12-18 DIAGNOSIS — Z8719 Personal history of other diseases of the digestive system: Secondary | ICD-10-CM | POA: Diagnosis not present

## 2017-12-18 DIAGNOSIS — Z87442 Personal history of urinary calculi: Secondary | ICD-10-CM | POA: Diagnosis not present

## 2017-12-18 DIAGNOSIS — Z95828 Presence of other vascular implants and grafts: Secondary | ICD-10-CM

## 2017-12-18 DIAGNOSIS — N201 Calculus of ureter: Secondary | ICD-10-CM

## 2017-12-18 LAB — CMP (CANCER CENTER ONLY)
ALT: 50 U/L — ABNORMAL HIGH (ref 0–44)
ANION GAP: 8 (ref 5–15)
AST: 28 U/L (ref 15–41)
Albumin: 4 g/dL (ref 3.5–5.0)
Alkaline Phosphatase: 81 U/L (ref 38–126)
BUN: 13 mg/dL (ref 6–20)
CALCIUM: 9.3 mg/dL (ref 8.9–10.3)
CHLORIDE: 108 mmol/L (ref 98–111)
CO2: 23 mmol/L (ref 22–32)
Creatinine: 1.1 mg/dL (ref 0.61–1.24)
GFR, Estimated: >60 mL/min (ref >60)
Glucose, Bld: 109 mg/dL — ABNORMAL HIGH (ref 70–99)
Potassium: 4 mmol/L (ref 3.5–5.1)
SODIUM: 139 mmol/L (ref 135–145)
Total Bilirubin: 0.5 mg/dL (ref 0.3–1.2)
Total Protein: 7.1 g/dL (ref 6.5–8.1)

## 2017-12-18 LAB — CBC WITH DIFFERENTIAL (CANCER CENTER ONLY)
BASOS PCT: 1 %
Basophils Absolute: 0.1 10*3/uL (ref 0.0–0.1)
Eosinophils Absolute: 0.2 10*3/uL (ref 0.0–0.5)
Eosinophils Relative: 3 %
HCT: 44 % (ref 38.4–49.9)
Hemoglobin: 15.1 g/dL (ref 13.0–17.1)
LYMPHS ABS: 2.6 10*3/uL (ref 0.9–3.3)
Lymphocytes Relative: 39 %
MCH: 32.2 pg (ref 27.2–33.4)
MCHC: 34.3 g/dL (ref 32.0–36.0)
MCV: 93.8 fL (ref 79.3–98.0)
MONOS PCT: 9 %
Monocytes Absolute: 0.6 10*3/uL (ref 0.1–0.9)
NEUTROS ABS: 3.2 10*3/uL (ref 1.5–6.5)
Neutrophils Relative %: 48 %
Platelet Count: 252 10*3/uL (ref 140–400)
RBC: 4.69 MIL/uL (ref 4.20–5.82)
RDW: 14.8 % — ABNORMAL HIGH (ref 11.0–14.6)
WBC Count: 6.7 10*3/uL (ref 4.0–10.3)

## 2017-12-18 MED ORDER — CAPECITABINE 500 MG PO TABS: 2000.0000 mg | ORAL_TABLET | Freq: Two times a day (BID) | ORAL | 0 refills | Status: DC

## 2017-12-18 MED ORDER — SODIUM CHLORIDE 0.9% FLUSH
10.0000 mL | INTRAVENOUS | Status: DC | PRN
  Administered 2017-12-18: 10 mL
  Filled 2017-12-18: qty 10

## 2017-12-18 MED ORDER — HEPARIN SOD (PORK) LOCK FLUSH 100 UNIT/ML IV SOLN
500.0000 [IU] | Freq: Once | INTRAVENOUS | Status: AC | PRN
  Administered 2017-12-18: 500 [IU]
  Filled 2017-12-18: qty 5

## 2017-12-18 NOTE — Telephone Encounter (Signed)
priinted avs and calender of upcoming appointment. Per 7/29 los

## 2018-01-08 ENCOUNTER — Other Ambulatory Visit: Payer: BLUE CROSS/BLUE SHIELD

## 2018-01-08 ENCOUNTER — Ambulatory Visit: Payer: BLUE CROSS/BLUE SHIELD | Admitting: Oncology

## 2018-01-08 ENCOUNTER — Other Ambulatory Visit: Payer: Self-pay

## 2018-01-08 ENCOUNTER — Telehealth: Payer: Self-pay | Admitting: Oncology

## 2018-01-08 DIAGNOSIS — C187 Malignant neoplasm of sigmoid colon: Secondary | ICD-10-CM

## 2018-01-08 NOTE — Telephone Encounter (Signed)
Patient called to reschedule  °

## 2018-01-09 MED ORDER — CAPECITABINE 500 MG PO TABS: 2000.0000 mg | ORAL_TABLET | Freq: Two times a day (BID) | ORAL | 0 refills | Status: DC

## 2018-01-23 ENCOUNTER — Inpatient Hospital Stay: Payer: BLUE CROSS/BLUE SHIELD

## 2018-01-23 ENCOUNTER — Inpatient Hospital Stay: Payer: BLUE CROSS/BLUE SHIELD | Attending: Oncology

## 2018-01-23 ENCOUNTER — Other Ambulatory Visit: Payer: Self-pay | Admitting: *Deleted

## 2018-01-23 ENCOUNTER — Other Ambulatory Visit: Payer: Self-pay | Admitting: Nurse Practitioner

## 2018-01-23 ENCOUNTER — Inpatient Hospital Stay (HOSPITAL_BASED_OUTPATIENT_CLINIC_OR_DEPARTMENT_OTHER): Payer: BLUE CROSS/BLUE SHIELD | Admitting: Oncology

## 2018-01-23 VITALS — BP 160/98 | HR 72 | Temp 98.0°F | Resp 18 | Ht 72.0 in | Wt 224.6 lb

## 2018-01-23 DIAGNOSIS — Z8719 Personal history of other diseases of the digestive system: Secondary | ICD-10-CM

## 2018-01-23 DIAGNOSIS — Z87442 Personal history of urinary calculi: Secondary | ICD-10-CM | POA: Diagnosis not present

## 2018-01-23 DIAGNOSIS — Z800 Family history of malignant neoplasm of digestive organs: Secondary | ICD-10-CM | POA: Diagnosis not present

## 2018-01-23 DIAGNOSIS — Z9049 Acquired absence of other specified parts of digestive tract: Secondary | ICD-10-CM | POA: Diagnosis not present

## 2018-01-23 DIAGNOSIS — C187 Malignant neoplasm of sigmoid colon: Secondary | ICD-10-CM

## 2018-01-23 DIAGNOSIS — Z85038 Personal history of other malignant neoplasm of large intestine: Secondary | ICD-10-CM | POA: Diagnosis not present

## 2018-01-23 DIAGNOSIS — Z9221 Personal history of antineoplastic chemotherapy: Secondary | ICD-10-CM | POA: Insufficient documentation

## 2018-01-23 DIAGNOSIS — Z95828 Presence of other vascular implants and grafts: Secondary | ICD-10-CM

## 2018-01-23 DIAGNOSIS — I10 Essential (primary) hypertension: Secondary | ICD-10-CM | POA: Insufficient documentation

## 2018-01-23 LAB — CMP (CANCER CENTER ONLY)
ALT: 44 U/L (ref 0–44)
ANION GAP: 11 (ref 5–15)
AST: 19 U/L (ref 15–41)
Albumin: 4.1 g/dL (ref 3.5–5.0)
Alkaline Phosphatase: 91 U/L (ref 38–126)
BUN: 21 mg/dL — ABNORMAL HIGH (ref 6–20)
CHLORIDE: 105 mmol/L (ref 98–111)
CO2: 24 mmol/L (ref 22–32)
Calcium: 10.1 mg/dL (ref 8.9–10.3)
Creatinine: 1.36 mg/dL — ABNORMAL HIGH (ref 0.61–1.24)
GFR, EST NON AFRICAN AMERICAN: 56 mL/min — AB (ref >60)
Glucose, Bld: 124 mg/dL — ABNORMAL HIGH (ref 70–99)
POTASSIUM: 5.2 mmol/L — AB (ref 3.5–5.1)
SODIUM: 140 mmol/L (ref 135–145)
Total Bilirubin: 0.5 mg/dL (ref 0.3–1.2)
Total Protein: 7.5 g/dL (ref 6.5–8.1)

## 2018-01-23 LAB — CBC WITH DIFFERENTIAL (CANCER CENTER ONLY)
Basophils Absolute: 0.1 10*3/uL (ref 0.0–0.1)
Basophils Relative: 1 %
Eosinophils Absolute: 0.2 10*3/uL (ref 0.0–0.5)
Eosinophils Relative: 3 %
HEMATOCRIT: 47.1 % (ref 38.4–49.9)
HEMOGLOBIN: 16.1 g/dL (ref 13.0–17.1)
LYMPHS ABS: 2.6 10*3/uL (ref 0.9–3.3)
Lymphocytes Relative: 30 %
MCH: 31.8 pg (ref 27.2–33.4)
MCHC: 34.2 g/dL (ref 32.0–36.0)
MCV: 93 fL (ref 79.3–98.0)
MONOS PCT: 9 %
Monocytes Absolute: 0.7 10*3/uL (ref 0.1–0.9)
NEUTROS ABS: 5.1 10*3/uL (ref 1.5–6.5)
NEUTROS PCT: 57 %
Platelet Count: 253 10*3/uL (ref 140–400)
RBC: 5.06 MIL/uL (ref 4.20–5.82)
RDW: 13.8 % (ref 11.0–14.6)
WBC: 8.7 10*3/uL (ref 4.0–10.3)

## 2018-01-23 LAB — BASIC METABOLIC PANEL - CANCER CENTER ONLY
ANION GAP: 10 (ref 5–15)
BUN: 20 mg/dL (ref 6–20)
CHLORIDE: 104 mmol/L (ref 98–111)
CO2: 27 mmol/L (ref 22–32)
Calcium: 10.4 mg/dL — ABNORMAL HIGH (ref 8.9–10.3)
Creatinine: 1.24 mg/dL (ref 0.61–1.24)
GFR, Estimated: >60 mL/min (ref >60)
Glucose, Bld: 80 mg/dL (ref 70–99)
POTASSIUM: 5.2 mmol/L — AB (ref 3.5–5.1)
Sodium: 141 mmol/L (ref 135–145)

## 2018-01-23 LAB — CEA (IN HOUSE-CHCC): CEA (CHCC-In House): 2.28 ng/mL (ref 0.00–5.00)

## 2018-01-23 MED ORDER — HEPARIN SOD (PORK) LOCK FLUSH 100 UNIT/ML IV SOLN
500.0000 [IU] | Freq: Once | INTRAVENOUS | Status: AC | PRN
  Administered 2018-01-23: 500 [IU]
  Filled 2018-01-23: qty 5

## 2018-01-23 MED ORDER — SODIUM CHLORIDE 0.9% FLUSH
10.0000 mL | INTRAVENOUS | Status: DC | PRN
  Administered 2018-01-23: 10 mL
  Filled 2018-01-23: qty 10

## 2018-01-23 NOTE — Progress Notes (Signed)
Port flushed several times and repositioned with no blood return. Pt opt to have blood drawn peripherally instead of administering CATHFLOW. Porsche Cates LPN

## 2018-01-23 NOTE — Progress Notes (Signed)
  [place] OFFICE PROGRESS NOTE   Diagnosis: Colon cancer  INTERVAL HISTORY:   Anthony Knight completed cycle 8 Xeloda beginning 12/18/2017.  No mouth sores, diarrhea, or hand/foot pain.  He has minimal neuropathy symptoms.  He is working.  No difficulty with bowel function.  He has occasional sharp pain in the left lower abdomen.  Objective:  Vital signs in last 24 hours:  Blood pressure (!) 160/98, pulse 72, temperature 98 F (36.7 C), temperature source Oral, resp. rate 18, height 6' (1.829 m), weight 224 lb 9.6 oz (101.9 kg), SpO2 99 %.    HEENT: Neck without mass Lymphatics: No cervical, supraclavicular, axillary, or inguinal nodes Resp: Lungs clear bilaterally Cardio: Regular rate and rhythm GI: No hepatospleno megaly, no mass, nontender,?  Reducible left inguinal hernia Vascular: No leg edema    Portacath/PICC-without erythema  Lab Results:  Lab Results  Component Value Date   WBC 8.7 01/23/2018   HGB 16.1 01/23/2018   HCT 47.1 01/23/2018   MCV 93.0 01/23/2018   PLT 253 01/23/2018   NEUTROABS 5.1 01/23/2018    CMP  Lab Results  Component Value Date   NA 140 01/23/2018   K 5.2 (H) 01/23/2018   CL 105 01/23/2018   CO2 24 01/23/2018   GLUCOSE 124 (H) 01/23/2018   BUN 21 (H) 01/23/2018   CREATININE 1.36 (H) 01/23/2018   CALCIUM 10.1 01/23/2018   PROT 7.5 01/23/2018   ALBUMIN 4.1 01/23/2018   AST 19 01/23/2018   ALT 44 01/23/2018   ALKPHOS 91 01/23/2018   BILITOT 0.5 01/23/2018   GFRNONAA 56 (L) 01/23/2018   GFRAA >60 01/23/2018    Lab Results  Component Value Date   CEA1 1.52 06/19/2017   Medications: I have reviewed the patient's current medications.   Assessment/Plan: 1. Adenocarcinoma of the sigmoid colon, synchronous primary tumors separated by 2 cm, status post a sigmoid colectomy 05/26/2017 ? Stage III (T3N1) moderately differentiated adenocarcinoma, MSI-stable, no loss of mismatch repair protein expression, lymphovascular and  perineural invasion noted ? Elevated preoperative CEA ? CT abdomen/pelvis 05/05/2018-wall thickening of the sigmoid colon, mesenteric nodes at the left common iliac, sigmoid diverticulosis ? CT chest 06/26/2017-negative for metastatic disease ? Cycle 1 CAPOX 06/29/2017 ? Cycle 2 CAPOX 07/20/2017 ? Cycle of Xeloda 08/17/2017 ? Cycle of Xeloda 09/09/2017 ? Cycle of Xeloda 09/30/2017 ? Cycle of Xeloda 11/06/2017 ? Cycle of Xeloda 11/27/2017  ? Cycle of Xeldoa 12/18/2017  2. Hypertension 3. Family history of colon cancer 4. High rectal polyp noted at rigid sigmoidoscopy during surgery 05/26/2017, no preoperative colonoscopy was performed 5. History of kidney stones   Disposition: Anthony Knight completed cycle 8 of adjuvant therapy beginning 12/18/2017.  He is in clinical remission from colon cancer.  We will refer him to Dr. Marlou Starks for Port-A-Cath removal and a colonoscopy.  He did not complete a preoperative colonoscopy.  We will follow-up on the CEA from today.  He will return for an office visit, CEA, and restaging CTs during the last week of December.  15 minutes were spent with the patient today.  The majority of the time was used for counseling and coordination of care.  Betsy Coder, MD  01/23/2018  10:32 AM

## 2018-01-29 ENCOUNTER — Telehealth: Payer: Self-pay | Admitting: *Deleted

## 2018-01-29 NOTE — Telephone Encounter (Signed)
Notified of messages     Please call patient, the potassium level is mildly elevated, potentially related to his blood pressure medication, he should follow-up with his primary physician

## 2018-01-29 NOTE — Telephone Encounter (Signed)
-----   Message from Ladell Pier, MD sent at 01/23/2018  5:44 PM EDT ----- Please call patient, cea is normal, f/u as scheduled

## 2018-02-21 ENCOUNTER — Ambulatory Visit: Payer: Self-pay | Admitting: Surgery

## 2018-02-21 ENCOUNTER — Ambulatory Visit: Payer: Self-pay | Admitting: General Surgery

## 2018-02-27 NOTE — Progress Notes (Addendum)
05-04-17 (Epic) EKG  06-26-17 (Epic) CT Chest  10-12-17 (Epic) LOV w/Cardiologist

## 2018-02-27 NOTE — Patient Instructions (Addendum)
Anthony Knight  02/27/2018   Your procedure is scheduled on: 03-01-18     Report to [facility] Main  Entrance    Report to Admitting at 10:00 AM    Call this number if you have problems the morning of surgery [phone]     Remember: NO SOLID FOOD AFTER MIDNIGHT THE NIGHT PRIOR TO SURGERY. NOTHING BY MOUTH EXCEPT CLEAR LIQUIDS UNTIL 3 HOURS PRIOR TO Anthony Knight SURGERY. PLEASE FINISH ENSURE DRINK PER SURGEON ORDER 3 HOURS PRIOR TO SCHEDULED SURGERY TIME WHICH NEEDS TO BE COMPLETED AT 9:00 AM ____________.     CLEAR LIQUID DIET   Foods Allowed                                                                     Foods Excluded  Coffee and tea, regular and decaf                             liquids that you cannot  Plain Jell-O in any flavor                                             see through such as: Fruit ices (not with fruit pulp)                                     milk, soups, orange juice  Iced Popsicles                                    All solid food Carbonated beverages, regular and diet                                    Cranberry, grape and apple juices Sports drinks like Gatorade Lightly seasoned clear broth or consume(fat free) Sugar, honey syrup  Sample Menu Breakfast                                Lunch                                     Supper Cranberry juice                    Beef broth                            Chicken broth Jell-O                                     Grape juice  Apple juice Coffee or tea                        Jell-O                                      Popsicle                                                Coffee or tea                        Coffee or tea  _____________________________________________________________________       Take these medicines the morning of surgery with A SIP OF WATER: None   BRUSH YOUR TEETH MORNING OF SURGERY AND RINSE YOUR MOUTH OUT, NO CHEWING GUM CANDY  OR MINTS.                               You may not have any metal on your body including hair pins and              piercings  Do not wear jewelry, lotions, powders, cologne or deodorant             Men may shave face and neck.   Do not bring valuables to the hospital. Anthony Knight.  Contacts, dentures or bridgework may not be worn into surgery.    Patients discharged the day of surgery will not be allowed to drive home.  Name and phone number of your driver: Anthony Knight [phone]               Please read over the following fact sheets you were given: _____________________________________________________________________         Anthony Knight - Preparing for Surgery Before surgery, you can play an important role.  Because skin is not sterile, your skin needs to be as free of germs as possible.  You can reduce the number of germs on your skin by washing with CHG (chlorahexidine gluconate) soap before surgery.  CHG is an antiseptic cleaner which kills germs and bonds with the skin to continue killing germs even after washing. Please DO NOT use if you have an allergy to CHG or antibacterial soaps.  If your skin becomes reddened/irritated stop using the CHG and inform your nurse when you arrive at Short Stay. Do not shave (including legs and underarms) for at least 48 hours prior to the first CHG shower.  You may shave your face/neck. Please follow these instructions carefully:  1.  Shower with CHG Soap the night before surgery and the  morning of Surgery.  2.  If you choose to wash your hair, wash your hair first as usual with your  normal  shampoo.  3.  After you shampoo, rinse your hair and body thoroughly to remove the  shampoo.                           4.  Use CHG as you would any other liquid soap.  You  can apply chg directly  to the skin and wash                       Gently with a scrungie or clean washcloth.  5.  Apply the CHG Soap to  your body ONLY FROM THE NECK DOWN.   Do not use on face/ open                           Wound or open sores. Avoid contact with eyes, ears mouth and genitals (private parts).                       Wash face,  Genitals (private parts) with your normal soap.             6.  Wash thoroughly, paying special attention to the area where your surgery  will be performed.  7.  Thoroughly rinse your body with warm water from the neck down.  8.  DO NOT shower/wash with your normal soap after using and rinsing off  the CHG Soap.                9.  Pat yourself dry with a clean towel.            10.  Wear clean pajamas.            11.  Place clean sheets on your bed the night of your first shower and do not  sleep with pets. Day of Surgery : Do not apply any lotions/deodorants the morning of surgery.  Please wear clean clothes to the hospital/surgery center.  FAILURE TO FOLLOW THESE INSTRUCTIONS MAY RESULT IN THE CANCELLATION OF YOUR SURGERY PATIENT SIGNATURE_________________________________  NURSE SIGNATURE__________________________________  ________________________________________________________________________

## 2018-02-28 ENCOUNTER — Encounter (HOSPITAL_COMMUNITY): Payer: Self-pay

## 2018-02-28 ENCOUNTER — Encounter (HOSPITAL_COMMUNITY)
Admission: RE | Admit: 2018-02-28 | Discharge: 2018-02-28 | Disposition: A | Discharge status: Home / Self Care | Payer: BLUE CROSS/BLUE SHIELD | Source: Ambulatory Visit | Attending: Surgery | Admitting: Surgery

## 2018-02-28 ENCOUNTER — Other Ambulatory Visit: Payer: Self-pay

## 2018-02-28 DIAGNOSIS — Z01812 Encounter for preprocedural laboratory examination: Secondary | ICD-10-CM | POA: Diagnosis not present

## 2018-02-28 DIAGNOSIS — C189 Malignant neoplasm of colon, unspecified: Secondary | ICD-10-CM | POA: Diagnosis not present

## 2018-02-28 DIAGNOSIS — Z01818 Encounter for other preprocedural examination: Secondary | ICD-10-CM | POA: Diagnosis not present

## 2018-02-28 LAB — CBC
HEMATOCRIT: 49.3 % (ref 39.0–52.0)
HEMOGLOBIN: 16.3 g/dL (ref 13.0–17.0)
MCH: 30.5 pg (ref 26.0–34.0)
MCHC: 33.1 g/dL (ref 30.0–36.0)
MCV: 92.1 fL (ref 80.0–100.0)
Platelets: 277 10*3/uL (ref 150–400)
RBC: 5.35 MIL/uL (ref 4.22–5.81)
RDW: 13.1 % (ref 11.5–15.5)
WBC: 6.4 10*3/uL (ref 4.0–10.5)
nRBC: 0 % (ref 0.0–0.2)

## 2018-02-28 LAB — BASIC METABOLIC PANEL
Anion gap: 8 (ref 5–15)
BUN: 19 mg/dL (ref 6–20)
CHLORIDE: 106 mmol/L (ref 98–111)
CO2: 27 mmol/L (ref 22–32)
Calcium: 9.7 mg/dL (ref 8.9–10.3)
Creatinine, Ser: 1.21 mg/dL (ref 0.61–1.24)
GFR calc Af Amer: >60 mL/min (ref >60)
GFR calc non Af Amer: >60 mL/min (ref >60)
GLUCOSE: 112 mg/dL — AB (ref 70–99)
POTASSIUM: 4.7 mmol/L (ref 3.5–5.1)
Sodium: 141 mmol/L (ref 135–145)

## 2018-03-01 ENCOUNTER — Encounter (HOSPITAL_COMMUNITY): Payer: Self-pay | Admitting: *Deleted

## 2018-03-01 ENCOUNTER — Ambulatory Visit (HOSPITAL_COMMUNITY): Payer: BLUE CROSS/BLUE SHIELD | Admitting: Anesthesiology

## 2018-03-01 ENCOUNTER — Encounter (HOSPITAL_COMMUNITY)
Admission: RE | Disposition: A | Discharge status: Home / Self Care | Payer: Self-pay | Source: Ambulatory Visit | Attending: Surgery

## 2018-03-01 ENCOUNTER — Ambulatory Visit (HOSPITAL_COMMUNITY)
Admission: RE | Admit: 2018-03-01 | Discharge: 2018-03-01 | Disposition: A | Discharge status: Home / Self Care | Payer: BLUE CROSS/BLUE SHIELD | Source: Ambulatory Visit | Attending: Surgery | Admitting: Surgery

## 2018-03-01 DIAGNOSIS — K219 Gastro-esophageal reflux disease without esophagitis: Secondary | ICD-10-CM | POA: Diagnosis not present

## 2018-03-01 DIAGNOSIS — Z452 Encounter for adjustment and management of vascular access device: Secondary | ICD-10-CM | POA: Diagnosis not present

## 2018-03-01 DIAGNOSIS — Z86010 Personal history of colonic polyps: Secondary | ICD-10-CM | POA: Diagnosis not present

## 2018-03-01 DIAGNOSIS — Z8042 Family history of malignant neoplasm of prostate: Secondary | ICD-10-CM | POA: Diagnosis not present

## 2018-03-01 DIAGNOSIS — Z800 Family history of malignant neoplasm of digestive organs: Secondary | ICD-10-CM | POA: Diagnosis not present

## 2018-03-01 DIAGNOSIS — Z809 Family history of malignant neoplasm, unspecified: Secondary | ICD-10-CM | POA: Diagnosis not present

## 2018-03-01 DIAGNOSIS — K6389 Other specified diseases of intestine: Secondary | ICD-10-CM | POA: Diagnosis not present

## 2018-03-01 DIAGNOSIS — Z6830 Body mass index (BMI) 30.0-30.9, adult: Secondary | ICD-10-CM | POA: Insufficient documentation

## 2018-03-01 DIAGNOSIS — I10 Essential (primary) hypertension: Secondary | ICD-10-CM | POA: Diagnosis not present

## 2018-03-01 DIAGNOSIS — D1339 Benign neoplasm of other parts of small intestine: Secondary | ICD-10-CM | POA: Insufficient documentation

## 2018-03-01 DIAGNOSIS — Z9221 Personal history of antineoplastic chemotherapy: Secondary | ICD-10-CM | POA: Diagnosis not present

## 2018-03-01 DIAGNOSIS — G629 Polyneuropathy, unspecified: Secondary | ICD-10-CM | POA: Insufficient documentation

## 2018-03-01 DIAGNOSIS — E669 Obesity, unspecified: Secondary | ICD-10-CM | POA: Insufficient documentation

## 2018-03-01 DIAGNOSIS — Z801 Family history of malignant neoplasm of trachea, bronchus and lung: Secondary | ICD-10-CM | POA: Diagnosis not present

## 2018-03-01 DIAGNOSIS — Z9049 Acquired absence of other specified parts of digestive tract: Secondary | ICD-10-CM | POA: Diagnosis not present

## 2018-03-01 DIAGNOSIS — Z7982 Long term (current) use of aspirin: Secondary | ICD-10-CM | POA: Insufficient documentation

## 2018-03-01 DIAGNOSIS — C187 Malignant neoplasm of sigmoid colon: Secondary | ICD-10-CM | POA: Insufficient documentation

## 2018-03-01 DIAGNOSIS — Z87442 Personal history of urinary calculi: Secondary | ICD-10-CM | POA: Diagnosis not present

## 2018-03-01 DIAGNOSIS — R51 Headache: Secondary | ICD-10-CM | POA: Diagnosis not present

## 2018-03-01 DIAGNOSIS — K5140 Inflammatory polyps of colon without complications: Secondary | ICD-10-CM | POA: Diagnosis not present

## 2018-03-01 DIAGNOSIS — K621 Rectal polyp: Secondary | ICD-10-CM | POA: Diagnosis not present

## 2018-03-01 DIAGNOSIS — Z980 Intestinal bypass and anastomosis status: Secondary | ICD-10-CM | POA: Insufficient documentation

## 2018-03-01 DIAGNOSIS — Z87891 Personal history of nicotine dependence: Secondary | ICD-10-CM | POA: Diagnosis not present

## 2018-03-01 DIAGNOSIS — C19 Malignant neoplasm of rectosigmoid junction: Secondary | ICD-10-CM | POA: Diagnosis present

## 2018-03-01 DIAGNOSIS — Z85038 Personal history of other malignant neoplasm of large intestine: Secondary | ICD-10-CM | POA: Diagnosis not present

## 2018-03-01 HISTORY — PX: PORT-A-CATH REMOVAL: SHX5289

## 2018-03-01 HISTORY — PX: COLONOSCOPY: SHX5424

## 2018-03-01 SURGERY — COLONOSCOPY
Anesthesia: Monitor Anesthesia Care | Site: Rectum

## 2018-03-01 MED ORDER — FENTANYL CITRATE (PF) 100 MCG/2ML IJ SOLN
INTRAMUSCULAR | Status: AC
  Filled 2018-03-01: qty 2

## 2018-03-01 MED ORDER — FENTANYL CITRATE (PF) 100 MCG/2ML IJ SOLN: 25.0000 ug | INTRAMUSCULAR | Status: DC | PRN

## 2018-03-01 MED ORDER — 0.9 % SODIUM CHLORIDE (POUR BTL) OPTIME
TOPICAL | Status: DC | PRN
  Administered 2018-03-01: 1000 mL

## 2018-03-01 MED ORDER — PROMETHAZINE HCL 25 MG/ML IJ SOLN: 6.2500 mg | INTRAMUSCULAR | Status: DC | PRN

## 2018-03-01 MED ORDER — SODIUM CHLORIDE 0.9 % IV SOLN
INTRAVENOUS | Status: DC
  Administered 2018-03-01: 10:00:00 via INTRAVENOUS

## 2018-03-01 MED ORDER — MIDAZOLAM HCL 2 MG/2ML IJ SOLN
INTRAMUSCULAR | Status: AC
  Filled 2018-03-01: qty 2

## 2018-03-01 MED ORDER — PROPOFOL 500 MG/50ML IV EMUL
INTRAVENOUS | Status: DC | PRN
  Administered 2018-03-01: 125 ug/kg/min via INTRAVENOUS

## 2018-03-01 MED ORDER — PROPOFOL 10 MG/ML IV BOLUS
INTRAVENOUS | Status: AC
  Filled 2018-03-01: qty 20

## 2018-03-01 MED ORDER — LACTATED RINGERS IV SOLN
INTRAVENOUS | Status: DC | PRN
  Administered 2018-03-01: 13:00:00 via INTRAVENOUS

## 2018-03-01 MED ORDER — CELECOXIB 200 MG PO CAPS
200.0000 mg | ORAL_CAPSULE | ORAL | Status: AC
  Administered 2018-03-01: 200 mg via ORAL
  Filled 2018-03-01: qty 1

## 2018-03-01 MED ORDER — CEFAZOLIN SODIUM-DEXTROSE 2-4 GM/100ML-% IV SOLN
2.0000 g | INTRAVENOUS | Status: AC
  Administered 2018-03-01: 2 g via INTRAVENOUS
  Filled 2018-03-01: qty 100

## 2018-03-01 MED ORDER — OXYCODONE-ACETAMINOPHEN 5-325 MG PO TABS: 1.0000 | ORAL_TABLET | Freq: Three times a day (TID) | ORAL | 0 refills | Status: DC | PRN

## 2018-03-01 MED ORDER — BUPIVACAINE-EPINEPHRINE (PF) 0.5% -1:200000 IJ SOLN
INTRAMUSCULAR | Status: DC | PRN
  Administered 2018-03-01: 10 mL via PERINEURAL

## 2018-03-01 MED ORDER — LACTATED RINGERS IV SOLN: INTRAVENOUS | Status: DC | PRN

## 2018-03-01 MED ORDER — CHLORHEXIDINE GLUCONATE CLOTH 2 % EX PADS
6.0000 | MEDICATED_PAD | Freq: Once | CUTANEOUS | Status: AC
  Administered 2018-03-01: 6 via TOPICAL

## 2018-03-01 MED ORDER — ACETAMINOPHEN 500 MG PO TABS
1000.0000 mg | ORAL_TABLET | ORAL | Status: AC
  Administered 2018-03-01: 1000 mg via ORAL
  Filled 2018-03-01: qty 2

## 2018-03-01 MED ORDER — PROPOFOL 10 MG/ML IV BOLUS
INTRAVENOUS | Status: AC
  Filled 2018-03-01: qty 40

## 2018-03-01 MED ORDER — GABAPENTIN 300 MG PO CAPS
300.0000 mg | ORAL_CAPSULE | ORAL | Status: AC
  Administered 2018-03-01: 300 mg via ORAL
  Filled 2018-03-01: qty 1

## 2018-03-01 MED ORDER — PROPOFOL 10 MG/ML IV BOLUS
INTRAVENOUS | Status: DC | PRN
  Administered 2018-03-01 (×2): 20 mg via INTRAVENOUS

## 2018-03-01 MED ORDER — MEPERIDINE HCL 50 MG/ML IJ SOLN: 6.2500 mg | INTRAMUSCULAR | Status: DC | PRN

## 2018-03-01 MED ORDER — BUPIVACAINE-EPINEPHRINE (PF) 0.5% -1:200000 IJ SOLN
INTRAMUSCULAR | Status: AC
  Filled 2018-03-01: qty 30

## 2018-03-01 MED ORDER — FENTANYL CITRATE (PF) 100 MCG/2ML IJ SOLN
INTRAMUSCULAR | Status: DC | PRN
  Administered 2018-03-01 (×2): 50 ug via INTRAVENOUS

## 2018-03-01 MED ORDER — MIDAZOLAM HCL 2 MG/2ML IJ SOLN: 0.5000 mg | Freq: Once | INTRAMUSCULAR | Status: DC | PRN

## 2018-03-01 MED ORDER — MIDAZOLAM HCL 5 MG/5ML IJ SOLN
INTRAMUSCULAR | Status: DC | PRN
  Administered 2018-03-01: 2 mg via INTRAVENOUS

## 2018-03-01 SURGICAL SUPPLY — 30 items
BLADE HEX COATED 2.75 (ELECTRODE) ×2 IMPLANT
BLADE SURG 15 STRL LF DISP TIS (BLADE) ×2 IMPLANT
BLADE SURG 15 STRL SS (BLADE) ×2
COVER WAND RF STERILE (DRAPES) IMPLANT
DECANTER SPIKE VIAL GLASS SM (MISCELLANEOUS) ×3 IMPLANT
DERMABOND ADVANCED (GAUZE/BANDAGES/DRESSINGS) ×1
DERMABOND ADVANCED .7 DNX12 (GAUZE/BANDAGES/DRESSINGS) ×2 IMPLANT
DRAPE LAPAROTOMY TRNSV 102X78 (DRAPE) ×3 IMPLANT
ELECT PENCIL ROCKER SW 15FT (MISCELLANEOUS) ×3 IMPLANT
ELECT REM PT RETURN 15FT ADLT (MISCELLANEOUS) ×3 IMPLANT
GAUZE SPONGE 4X4 12PLY STRL (GAUZE/BANDAGES/DRESSINGS) IMPLANT
GLOVE BIO SURGEON STRL SZ7.5 (GLOVE) ×3 IMPLANT
GLOVE BIOGEL PI IND STRL 7.0 (GLOVE) ×2 IMPLANT
GLOVE BIOGEL PI INDICATOR 7.0 (GLOVE) ×1
GOWN STRL REUS W/ TWL XL LVL3 (GOWN DISPOSABLE) ×2 IMPLANT
GOWN STRL REUS W/TWL LRG LVL3 (GOWN DISPOSABLE) ×4 IMPLANT
GOWN STRL REUS W/TWL XL LVL3 (GOWN DISPOSABLE) ×2 IMPLANT
KIT BASIN OR (CUSTOM PROCEDURE TRAY) ×3 IMPLANT
NDL HYPO 25X1 1.5 SAFETY (NEEDLE) ×2 IMPLANT
NEEDLE HYPO 22GX1.5 SAFETY (NEEDLE) IMPLANT
NEEDLE HYPO 25X1 1.5 SAFETY (NEEDLE) ×3 IMPLANT
PACK BASIC VI WITH GOWN DISP (CUSTOM PROCEDURE TRAY) ×3 IMPLANT
SPONGE LAP 4X18 RFD (DISPOSABLE) ×3 IMPLANT
SUT MNCRL AB 4-0 PS2 18 (SUTURE) ×3 IMPLANT
SUT VIC AB 3-0 SH 27 (SUTURE)
SUT VIC AB 3-0 SH 27XBRD (SUTURE) IMPLANT
SYR BULB IRRIGATION 50ML (SYRINGE) IMPLANT
SYR CONTROL 10ML LL (SYRINGE) ×3 IMPLANT
TOWEL OR 17X26 10 PK STRL BLUE (TOWEL DISPOSABLE) ×3 IMPLANT
YANKAUER SUCT BULB TIP 10FT TU (MISCELLANEOUS) IMPLANT

## 2018-03-01 NOTE — Anesthesia Preprocedure Evaluation (Addendum)
Anesthesia Evaluation  Patient identified by MRN, date of birth, ID band Patient awake    Reviewed: Allergy & Precautions, NPO status , Patient's Chart, lab work & pertinent test results  History of Anesthesia Complications Negative for: history of anesthetic complications  Airway Mallampati: II  TM Distance: >3 FB Neck ROM: Full    Dental  (+) Chipped, Dental Advisory Given   Pulmonary former smoker (quit 1986),    breath sounds clear to auscultation       Cardiovascular hypertension, Pt. on medications (-) angina Rhythm:Regular Rate:Normal     Neuro/Psych  Headaches,    GI/Hepatic Neg liver ROS, GERD  Medicated and Controlled,H/o colon cancer   Endo/Other  negative endocrine ROS  Renal/GU negative Renal ROS     Musculoskeletal   Abdominal (+) + obese,   Peds  Hematology negative hematology ROS (+)   Anesthesia Other Findings   Reproductive/Obstetrics                            Anesthesia Physical Anesthesia Plan  ASA: II  Anesthesia Plan: MAC   Post-op Pain Management:    Induction:   PONV Risk Score and Plan: 1 and Treatment may vary due to age or medical condition and Ondansetron  Airway Management Planned: Natural Airway and Nasal Cannula  Additional Equipment:   Intra-op Plan:   Post-operative Plan:   Informed Consent: I have reviewed the patients History and Physical, chart, labs and discussed the procedure including the risks, benefits and alternatives for the proposed anesthesia with the patient or authorized representative who has indicated his/her understanding and acceptance.   Dental advisory given  Plan Discussed with: CRNA and Surgeon  Anesthesia Plan Comments:         Anesthesia Quick Evaluation

## 2018-03-01 NOTE — Anesthesia Postprocedure Evaluation (Addendum)
Anesthesia Post Note  Patient: Anthony Knight  Procedure(s) Performed: COLONOSCOPY (N/A Rectum) REMOVAL PORT-A-CATH (N/A )     Patient location during evaluation: PACU Anesthesia Type: MAC Level of consciousness: awake and alert, oriented and patient cooperative Pain management: pain level controlled Vital Signs Assessment: post-procedure vital signs reviewed and stable Respiratory status: spontaneous breathing, nonlabored ventilation and respiratory function stable Cardiovascular status: blood pressure returned to baseline and stable Postop Assessment: no apparent nausea or vomiting Anesthetic complications: no    Last Vitals:  Vitals:   03/01/18 1352 03/01/18 1402  BP:  140/88  Pulse: 68   Resp: 14 16  Temp:  36.7 C  SpO2: 100% 100%    Last Pain:  Vitals:   03/01/18 1402  TempSrc:   PainSc: 0-No pain                 Curren Mohrmann,E. Ashe Graybeal

## 2018-03-01 NOTE — Transfer of Care (Signed)
Immediate Anesthesia Transfer of Care Note  Patient: Anthony Knight  Procedure(s) Performed: COLONOSCOPY (N/A Rectum) REMOVAL PORT-A-CATH (N/A )  Patient Location: PACU  Anesthesia Type:MAC  Level of Consciousness: sedated  Airway & Oxygen Therapy: Patient Spontanous Breathing and Patient connected to face mask oxygen  Post-op Assessment: Report given to RN and Post -op Vital signs reviewed and stable  Post vital signs: Reviewed and stable  Last Vitals:  Vitals Value Taken Time  BP    Temp    Pulse 79 03/01/2018  1:14 PM  Resp 19 03/01/2018  1:14 PM  SpO2 98 % 03/01/2018  1:14 PM  Vitals shown include unvalidated device data.  Last Pain:  Vitals:   03/01/18 0945  TempSrc: Oral         Complications: No apparent anesthesia complications

## 2018-03-01 NOTE — Op Note (Signed)
[facility] Patient Name: Anthony Knight Procedure Date: 03/01/2018 MRN: [id number] Attending MD: Ileana Roup MD, MD Date of Birth: Sep 01, 1960 CSN: [id number] Age: 57 Admit Type: Inpatient Procedure:                Colonoscopy Indications:              High risk colon cancer surveillance: Personal                            history of colonic polyps, Last colonoscopy: 2004 Providers:                Sharon Mt. Dema Severin, MD, Tinnie Gens, Technician Referring MD:              Medicines:                Monitored Anesthesia Care Complications:            No immediate complications. Estimated Blood Loss:      Procedure:                Pre-Anesthesia Assessment:                           - Prior to the procedure, a History and Physical                            was performed, and patient medications, allergies                            and sensitivities were reviewed. The patient's                            tolerance of previous anesthesia was reviewed.                           - The risks and benefits of the procedure and the                            sedation options and risks were discussed with the                            patient. All questions were answered and informed                            consent was obtained.                           - Patient identification and proposed procedure                            were verified prior to the procedure by the                            physician, the nurse and the anesthetist. The                            procedure was verified  in the pre-procedure area in                            the procedure room.                           - ASA Grade Assessment: II - A patient with mild                            systemic disease.                           - After reviewing the risks and benefits, the                            patient was deemed in satisfactory condition to                            undergo  the procedure.                           - The anesthesia plan was to use monitored                            anesthesia care (MAC).                           After obtaining informed consent, the colonoscope                            was passed under direct vision. Throughout the                            procedure, the patient's blood pressure, pulse, and                            oxygen saturations were monitored continuously. The                            CF-HQ190L ([id number] Olympus adult colonoscope was                            introduced through the anus and advanced to the the                            terminal ileum, with identification of the                            appendiceal orifice and IC valve. The colonoscopy                            was performed without difficulty. The patient                            tolerated the procedure well. The quality of the  bowel preparation was adequate. Scope In: 12:18:36 PM Scope Out: 12:44:39 PM Scope Withdrawal Time: 0 hours 23 minutes 27 seconds  Total Procedure Duration: 0 hours 26 minutes 3 seconds  Findings:      The perianal and digital rectal examinations were normal. Pertinent       negatives include normal sphincter tone.      A 2 mm polyp was found in the rectum. The polyp was semi-sessile. The       polyp was removed with a jumbo cold forceps. Resection and retrieval       were complete. Estimated blood loss was minimal.      A diffuse area of mucosa in the terminal ileum was granular.      The terminal ileum contained diffuse pseudopolyps. This was biopsied       with a cold forceps for histology. Verification of patient       identification for the specimen was done by the physician, nurse and       technician using the patient's name, birth date and medical record       number. Estimated blood loss was minimal.      There was evidence of a prior end-to-end colo-colonic anastomosis in  the       recto-sigmoid colon. This was patent and was characterized by healthy       appearing mucosa. The anastomosis was traversed.      The exam was otherwise without abnormality on direct and retroflexion       views. Impression:               - One 2 mm polyp in the rectum, removed with a                            jumbo cold forceps. Resected and retrieved.                           - Granularity in the terminal ileum.                           - The rectum was extensively examined. Aside from                            the single 2 mm polyp which was removed, no                            additional polyps were identified                           - Pseudopolyps in the terminal ileum. Biopsied.                           - Patent end-to-end colo-colonic anastomosis,                            characterized by healthy appearing mucosa.                           - The examination was otherwise normal on direct  and retroflexion views. Moderate Sedation:      N/A- Per Anesthesia Care Recommendation:           - Discharge patient to home.                           - High fiber diet indefinitely.                           - Continue present medications.                           - Await pathology results.                           - Repeat colonoscopy in 1 year for surveillance.                           - Return to referring physician at appointment to                            be scheduled. Procedure Code(s):        --- Professional ---                           [phone], Colonoscopy, flexible; with biopsy, single                            or multiple Diagnosis Code(s):        --- Professional ---                           Z86.010, Personal history of colonic polyps                           K62.1, Rectal polyp                           K63.89, Other specified diseases of intestine                           K51.40, Inflammatory polyps of colon without                             complications                           Z98.0, Intestinal bypass and anastomosis status CPT copyright 2018 [employer]. All rights reserved. The codes documented in this report are preliminary and upon coder review may  be revised to meet current compliance requirements. Nadeen Landau, MD Ileana Roup MD, MD 03/01/2018 12:59:33 PM This report has been signed electronically. Number of Addenda: 0

## 2018-03-01 NOTE — Op Note (Signed)
03/01/2018  1:08 PM  PATIENT:  Anthony Knight  57 y.o. male  PRE-OPERATIVE DIAGNOSIS:  HISTORY OF COLON CANCER  POST-OPERATIVE DIAGNOSIS:  HISTORY OF COLON CANCER  PROCEDURE:  Procedure(s): REMOVAL PORT-A-CATH (N/A)  SURGEON:  Surgeon(s) and Role:     * Jovita Kussmaul, MD - Primary  PHYSICIAN ASSISTANT:   ASSISTANTS: none   ANESTHESIA:   local and IV sedation  EBL:  minimal   BLOOD ADMINISTERED:none  DRAINS: none   LOCAL MEDICATIONS USED:  MARCAINE     SPECIMEN:  No Specimen  DISPOSITION OF SPECIMEN:  N/A  COUNTS:  YES  TOURNIQUET:  * No tourniquets in log *  DICTATION: .Dragon Dictation   After informed consent was obtained the patient was brought to the operating room and placed in the supine position on the operating table.  After adequate IV sedation and been given the patient's left chest wall was prepped with ChloraPrep, allowed to dry, and draped in usual sterile manner.  The area around the port was infiltrated with quarter percent Marcaine.  A small incision was then made with a 15 blade knife through his previous incision.  The incision was carried through the subcutaneous tissue sharply with a 15 blade knife until the capsule surrounding the port was opened.  The 2 anchoring stitches were divided and removed.  The port was then carefully pushed out of the pocket and with gentle traction was removed from the patient without difficulty.  Pressure was held for several minutes until the area was completely hemostatic.  The deep layer of the wound was then closed with interrupted 3-0 Vicryl stitches.  The skin was then closed with a running 4-0 Monocryl subcuticular stitch.  Dermabond dressings were applied.  The patient tolerated the procedure well.  At the end of the case all needle sponge and instrument counts were correct.  The patient was then awakened and taken to recovery in stable condition.  PLAN OF CARE: Discharge to home after PACU  PATIENT DISPOSITION:   PACU - hemodynamically stable.   Delay start of Pharmacological VTE agent (>24hrs) due to surgical blood loss or risk of bleeding: not applicable

## 2018-03-01 NOTE — H&P (Signed)
CC: Here for colonoscopy by me and port removal by my partner Dr. Marlou Starks  HPI: Anthony Knight is an 57 y.o. male with hx of T3 N1 colon cancer of the sigmoid - now s/p sigmoidectomy and adjuvant chemotherapy. He completed chemotherapy 6 wks ago and tolerated reasonably well with the exception of some peripheral neuropathy. His last colonoscopy was 15 yrs ago and he denies any abnormal findings from that procedure. He denies taking any blood thinners aside from 68m of aspirin.  Completed bowel prep - reports clear liquid following.   Past Medical History:  Diagnosis Date  . Cancer ([facility]    colon cancer  . History of kidney stones    early 2018  . Hypertension   . Stricture of sigmoid colon ([facility]     Past Surgical History:  Procedure Laterality Date  . LAPAROSCOPIC SIGMOID COLECTOMY N/A 05/26/2017   Procedure: LAPAROSCOPIC ASSISTED SIGMOID COLECTOMY;  Surgeon: TJovita Kussmaul MD;  [place]  Service: General;  Laterality: N/A;  ERAS PATHWAY  . PORTACATH PLACEMENT Left 06/22/2017   Procedure: INSERTION PORT-A-CATH;  Surgeon: TJovita Kussmaul MD;  [place]  Service: General;  Laterality: Left;  . rectal tear     Per patient report  . SIGMOIDOSCOPY N/A 05/26/2017   Procedure: RIGID SIGMOIDOSCOPY;  Surgeon: TJovita Kussmaul MD;  [place]  Service: General;  Laterality: N/A;  . URETERAL STENT PLACEMENT  2002   kidney stones removal as well    Family History  Problem Relation Age of Onset  . Cancer Father        prostate  . Cancer Maternal Grandfather        lung and colon  . Cancer Maternal Uncle        colon  . Cancer Maternal Uncle        spleen  . Cancer Maternal Uncle     Social:  reports that he quit smoking about 33 years ago. He has never used smokeless tobacco. He reports that he drinks alcohol. He reports that he does not use drugs.  Allergies: No Known Allergies  Medications: I have reviewed the patient's current  medications.  Results for orders placed or performed during the hospital encounter of 02/28/18 (from the past 48 hour(s))  CBC     Status: None   Collection Time: 02/28/18  9:31 AM  Result Value Ref Range   WBC 6.4 4.0 - 10.5 K/uL   RBC 5.35 4.22 - 5.81 MIL/uL   Hemoglobin 16.3 13.0 - 17.0 g/dL   HCT 49.3 39.0 - 52.0 %   MCV 92.1 80.0 - 100.0 fL   MCH 30.5 26.0 - 34.0 pg   MCHC 33.1 30.0 - 36.0 g/dL   RDW 13.1 11.5 - 15.5 %   Platelets 277 150 - 400 K/uL   nRBC 0.0 0.0 - 0.2 %    Comment: [facility]formed at [facility] [address] [place] [id number] Basic metabolic panel     Status: Abnormal   Collection Time: 02/28/18  9:31 AM  Result Value Ref Range   Sodium 141 135 - 145 mmol/L   Potassium 4.7 3.5 - 5.1 mmol/L   Chloride 106 98 - 111 mmol/L   CO2 27 22 - 32 mmol/L   Glucose, Bld 112 (H) 70 - 99 mg/dL   BUN 19 6 - 20 mg/dL   Creatinine, Ser 1.21 0.61 - 1.24 mg/dL   Calcium 9.7 8.9 -  10.3 mg/dL   GFR calc non Af Amer >60 >60 mL/min   GFR calc Af Amer >60 >60 mL/min    Comment: (NOTE) The eGFR has been calculated using the CKD EPI equation. This calculation has not been validated in all clinical situations. eGFR's persistently <60 mL/min signify possible Chronic Kidney Disease.    Anion gap 8 5 - 15    Comment: Performed at [facility], [place] [address] [place], [place] [address]    No results found.  ROS - all of the below systems have been reviewed with the patient and positives are indicated with bold text General: chills, fever or night sweats Eyes: blurry vision or double vision ENT: epistaxis or sore throat Allergy/Immunology: itchy/watery eyes or nasal congestion Hematologic/Lymphatic: bleeding problems, blood clots or swollen lymph nodes Endocrine: temperature intolerance or unexpected weight changes Breast: new or changing breast lumps or nipple discharge Resp: cough, shortness of breath, or wheezing CV: chest  pain or dyspnea on exertion GI: as per HPI GU: dysuria, trouble voiding, or hematuria MSK: joint pain or joint stiffness Neuro: TIA or stroke symptoms Derm: pruritus and skin lesion changes Psych: anxiety and depression  PE Blood pressure (!) 149/104, pulse 89, temperature 97.9 F (36.6 C), temperature source Oral, resp. rate 18, height 6' (1.829 m), weight 101.2 kg, SpO2 97 %. Constitutional: NAD; conversant; no deformities Eyes: Moist conjunctiva; no lid lag; anicteric; PERRL Neck: Trachea midline; no thyromegaly Lungs: Normal respiratory effort; no tactile fremitus CV: RRR; no palpable thrills; no pitting edema GI: Abd soft, NT/ND; no palpable hepatosplenomegaly MSK: Normal gait; no clubbing/cyanosis Psychiatric: Appropriate affect; alert and oriented x3 Lymphatic: No palpable cervical or axillary lymphadenopathy  Results for orders placed or performed during the hospital encounter of 02/28/18 (from the past 48 hour(s))  CBC     Status: None   Collection Time: 02/28/18  9:31 AM  Result Value Ref Range   WBC 6.4 4.0 - 10.5 K/uL   RBC 5.35 4.22 - 5.81 MIL/uL   Hemoglobin 16.3 13.0 - 17.0 g/dL   HCT 49.3 39.0 - 52.0 %   MCV 92.1 80.0 - 100.0 fL   MCH 30.5 26.0 - 34.0 pg   MCHC 33.1 30.0 - 36.0 g/dL   RDW 13.1 11.5 - 15.5 %   Platelets 277 150 - 400 K/uL   nRBC 0.0 0.0 - 0.2 %    Comment: Performed at [facility], [place] [address], [place], [place] [address]  Basic metabolic panel     Status: Abnormal   Collection Time: 02/28/18  9:31 AM  Result Value Ref Range   Sodium 141 135 - 145 mmol/L   Potassium 4.7 3.5 - 5.1 mmol/L   Chloride 106 98 - 111 mmol/L   CO2 27 22 - 32 mmol/L   Glucose, Bld 112 (H) 70 - 99 mg/dL   BUN 19 6 - 20 mg/dL   Creatinine, Ser 1.21 0.61 - 1.24 mg/dL   Calcium 9.7 8.9 - 10.3 mg/dL   GFR calc non Af Amer >60 >60 mL/min   GFR calc Af Amer >60 >60 mL/min    Comment: (NOTE) The eGFR has been calculated using the CKD EPI  equation. This calculation has not been validated in all clinical situations. eGFR's persistently <60 mL/min signify possible Chronic Kidney Disease.    Anion gap 8 5 - 15    Comment: Performed at [facility], [place] [address] [place], [place] [address]    A/P:  Anthony Knight is an 57 y.o. male here for colonoscopy  -The anatomy and physiology of the GI tract was discussed at length with the patient. The pathophysiology of polyps and cancer of the colon was discussed as well. -The planned procedure, material risks (including, but not limited to,  bleeding, infection, need for blood transfusion, perforation of colon, damage to spleen, need for additional procedures, worsening of pre-existing medical conditions, recurrence, pneumonia, heart attack, stroke, death) benefits and alternatives to the procedure were discussed at length.The patient's questions were answered to his satisfaction, he voiced understanding and elected to proceed. Additionally, we discussed typical postoperative expectations and the recovery process.  Sharon Mt. Dema Severin, M.D. General and Colorectal Surgery [facility]

## 2018-03-01 NOTE — H&P (Signed)
  Anthony Knight  Location: [facility] Patient #: [id number] DOB: 1961/04/21 Married / Language: English / Race: White Male   History of Present Illness The patient is a 57 year old male who presents for a follow-up for Colorectal cancer. The patient is a 57 year old white male who is about 6 weeks status post laparoscopic-assisted sigmoid colectomy for a T3 N1 a colon cancer. He seemed to tolerate the surgery well. He is complaining of some pain along his incision. He is also started chemotherapy and does not seem to be tolerating it very well. He is having significant nausea and vomiting and diarrhea with neuropathy in the hands and feet.   Medication History Medications Reconciled  Vitals  Weight: 202.8 lb Height: 72in Body Surface Area: 2.14 m Body Mass Index: 27.5 kg/m  Pulse: 88 (Regular)  BP: 122/82 (Sitting, Left Arm, Standard)       Physical Exam  Abdomen Note: The abdomen is soft and nontender. The area sensitivity along the incision is where the knot from his closure stitch is. Otherwise there is no sign of infection. The incision itself has healed nicely.     Assessment & Plan  ADENOCARCINOMA, COLON (C18.9) Impression: The patient is about 6 weeks status post laparoscopic-assisted sigmoid colectomy for a colon cancer. He is currently receiving chemotherapy and seems to be struggling with that. I will let his oncologist know about this. Otherwise he may return to his normal activities without restriction. I will plan to see him back in about 6 months. He will also need a follow-up colonoscopy for a small polyp that was seen below the area of resection. He will need to get through his chemotherapy treatment before we plan for this. Current Plans Follow up with [place] in the office in 6 MONTHS.   Call us sooner as needed.  For port removal today. Risks and benefits of the surgery discussed with the patient and he understands and wishes to  proceed

## 2018-03-02 ENCOUNTER — Encounter (HOSPITAL_COMMUNITY): Payer: Self-pay | Admitting: Surgery

## 2018-04-09 DIAGNOSIS — N400 Enlarged prostate without lower urinary tract symptoms: Secondary | ICD-10-CM | POA: Diagnosis not present

## 2018-04-09 DIAGNOSIS — R0789 Other chest pain: Secondary | ICD-10-CM | POA: Diagnosis not present

## 2018-04-09 DIAGNOSIS — I10 Essential (primary) hypertension: Secondary | ICD-10-CM | POA: Diagnosis not present

## 2018-05-21 ENCOUNTER — Ambulatory Visit (HOSPITAL_COMMUNITY)
Admission: RE | Admit: 2018-05-21 | Discharge: 2018-05-21 | Disposition: A | Discharge status: Home / Self Care | Payer: BLUE CROSS/BLUE SHIELD | Source: Ambulatory Visit | Attending: Oncology | Admitting: Oncology

## 2018-05-21 ENCOUNTER — Inpatient Hospital Stay: Payer: BLUE CROSS/BLUE SHIELD | Attending: Oncology

## 2018-05-21 DIAGNOSIS — Z800 Family history of malignant neoplasm of digestive organs: Secondary | ICD-10-CM | POA: Insufficient documentation

## 2018-05-21 DIAGNOSIS — I10 Essential (primary) hypertension: Secondary | ICD-10-CM | POA: Insufficient documentation

## 2018-05-21 DIAGNOSIS — C187 Malignant neoplasm of sigmoid colon: Secondary | ICD-10-CM

## 2018-05-21 DIAGNOSIS — J9811 Atelectasis: Secondary | ICD-10-CM | POA: Diagnosis not present

## 2018-05-21 DIAGNOSIS — Z85038 Personal history of other malignant neoplasm of large intestine: Secondary | ICD-10-CM | POA: Diagnosis not present

## 2018-05-21 DIAGNOSIS — Z87442 Personal history of urinary calculi: Secondary | ICD-10-CM | POA: Insufficient documentation

## 2018-05-21 DIAGNOSIS — Z9221 Personal history of antineoplastic chemotherapy: Secondary | ICD-10-CM | POA: Diagnosis not present

## 2018-05-21 DIAGNOSIS — K4090 Unilateral inguinal hernia, without obstruction or gangrene, not specified as recurrent: Secondary | ICD-10-CM | POA: Diagnosis not present

## 2018-05-21 LAB — CEA (IN HOUSE-CHCC): CEA (CHCC-In House): 1.6 ng/mL (ref 0.00–5.00)

## 2018-05-21 LAB — BASIC METABOLIC PANEL - CANCER CENTER ONLY
Anion gap: 12 (ref 5–15)
BUN: 21 mg/dL — ABNORMAL HIGH (ref 6–20)
CO2: 24 mmol/L (ref 22–32)
Calcium: 10.1 mg/dL (ref 8.9–10.3)
Chloride: 104 mmol/L (ref 98–111)
Creatinine: 1.4 mg/dL — ABNORMAL HIGH (ref 0.61–1.24)
GFR, Est AFR Am: >60 mL/min
GFR, Estimated: 55 mL/min — ABNORMAL LOW
Glucose, Bld: 113 mg/dL — ABNORMAL HIGH (ref 70–99)
Potassium: 4.6 mmol/L (ref 3.5–5.1)
Sodium: 140 mmol/L (ref 135–145)

## 2018-05-21 MED ORDER — SODIUM CHLORIDE (PF) 0.9 % IJ SOLN
INTRAMUSCULAR | Status: AC
  Filled 2018-05-21: qty 50

## 2018-05-21 MED ORDER — IOHEXOL 300 MG/ML  SOLN
100.0000 mL | Freq: Once | INTRAMUSCULAR | Status: AC | PRN
  Administered 2018-05-21: 100 mL via INTRAVENOUS

## 2018-05-22 ENCOUNTER — Inpatient Hospital Stay: Payer: BLUE CROSS/BLUE SHIELD | Admitting: Oncology

## 2018-05-22 ENCOUNTER — Telehealth: Payer: Self-pay | Admitting: Oncology

## 2018-05-22 VITALS — BP 120/88 | HR 71 | Temp 97.8°F | Resp 18 | Ht 72.0 in | Wt 221.1 lb

## 2018-05-22 DIAGNOSIS — Z85038 Personal history of other malignant neoplasm of large intestine: Secondary | ICD-10-CM | POA: Diagnosis not present

## 2018-05-22 DIAGNOSIS — Z800 Family history of malignant neoplasm of digestive organs: Secondary | ICD-10-CM | POA: Diagnosis not present

## 2018-05-22 DIAGNOSIS — I10 Essential (primary) hypertension: Secondary | ICD-10-CM

## 2018-05-22 DIAGNOSIS — Z87442 Personal history of urinary calculi: Secondary | ICD-10-CM

## 2018-05-22 DIAGNOSIS — C187 Malignant neoplasm of sigmoid colon: Secondary | ICD-10-CM

## 2018-05-22 DIAGNOSIS — Z9221 Personal history of antineoplastic chemotherapy: Secondary | ICD-10-CM | POA: Diagnosis not present

## 2018-05-22 NOTE — Progress Notes (Signed)
[place] OFFICE PROGRESS NOTE   Diagnosis: Colon cancer  INTERVAL HISTORY:   [name] returns as scheduled.  He feels well.  No difficulty with bowel function.  He is recovering from a recent upper respiratory infection.  No other complaint.  He underwent a surveillance colonoscopy by Dr. Dema Severin in October.  Objective:  Vital signs in last 24 hours:  Blood pressure 120/88, pulse 71, temperature 97.8 F (36.6 C), temperature source Oral, resp. rate 18, height 6' (1.829 m), weight 221 lb 1.6 oz (100.3 kg), SpO2 97 %.    HEENT: Neck without mass Lymphatics: No cervical, supraclavicular, axillary, or inguinal nodes Resp: Lungs clear bilaterally Cardio: Regular rate and rhythm GI: No hepatosplenomegaly, nontender, no mass Vascular: No leg edema     Lab Results:  Lab Results  Component Value Date   WBC 6.4 02/28/2018   HGB 16.3 02/28/2018   HCT 49.3 02/28/2018   MCV 92.1 02/28/2018   PLT 277 02/28/2018   NEUTROABS 5.1 01/23/2018    CMP  Lab Results  Component Value Date   NA 140 05/21/2018   K 4.6 05/21/2018   CL 104 05/21/2018   CO2 24 05/21/2018   GLUCOSE 113 (H) 05/21/2018   BUN 21 (H) 05/21/2018   CREATININE 1.40 (H) 05/21/2018   CALCIUM 10.1 05/21/2018   PROT 7.5 01/23/2018   ALBUMIN 4.1 01/23/2018   AST 19 01/23/2018   ALT 44 01/23/2018   ALKPHOS 91 01/23/2018   BILITOT 0.5 01/23/2018   GFRNONAA 55 (L) 05/21/2018   GFRAA >60 05/21/2018    Lab Results  Component Value Date   CEA1 1.60 05/21/2018    Imaging:  Ct Chest W Contrast  Result Date: 05/21/2018 CLINICAL DATA:  Patient with history of colon cancer diagnosed in 2018 status post chemotherapy and surgery. EXAM: CT CHEST, ABDOMEN, AND PELVIS WITH CONTRAST TECHNIQUE: Multidetector CT imaging of the chest, abdomen and pelvis was performed following the standard protocol during bolus administration of intravenous contrast. CONTRAST:  189m OMNIPAQUE IOHEXOL 300 MG/ML  SOLN  COMPARISON:  CT abdomen pelvis 11/02/2017; CT chest 06/26/2017; CT abdomen pelvis 05/30/2017 FINDINGS: CT CHEST FINDINGS Cardiovascular: Normal heart size. No pericardial effusion. Aorta and main pulmonary artery normal in caliber. Mediastinum/Nodes: No enlarged axillary, mediastinal or hilar lymphadenopathy. Normal appearance of the esophagus. Lungs/Pleura: Central airways are patent. Dependent atelectasis within the bilateral lower lobes. No large area pulmonary consolidation. No pleural effusion or pneumothorax. Musculoskeletal: No aggressive or acute appearing osseous lesions. CT ABDOMEN PELVIS FINDINGS Hepatobiliary: Liver is normal in size and contour. No focal hepatic lesion is identified. Geographic regions of hyper attenuation demonstrated within the hepatic dome extending towards the caudate (image 53; series 2) (image 64; series 2), most compatible with focal fatty sparing. Normal appearance of the gallbladder. No intrahepatic or extrahepatic biliary ductal dilatation. Pancreas: Unremarkable Spleen: Unremarkable Adrenals/Urinary Tract: Normal adrenal glands. Kidneys enhance symmetrically with contrast. 5 mm stone superior pole left kidney. Subcentimeter too small to characterize low-attenuation lesions inferior pole left kidney. No hydronephrosis. Urinary bladder is unremarkable. Stomach/Bowel: Oral contrast material throughout the small large bowel. No evidence for bowel obstruction. No free fluid or free intraperitoneal air. Normal morphology of the stomach. Stable postsurgical changes involving the sigmoid colon. Vascular/Lymphatic: Normal caliber abdominal aorta. No retroperitoneal lymphadenopathy. Reproductive: Enlarged heterogeneous prostate. Other: Small fat containing inguinal hernias bilaterally, left-greater-than-right. Musculoskeletal: Lumbar spine degenerative changes. No aggressive or acute appearing osseous lesions. IMPRESSION: 1. No definite evidence for metastatic disease within the chest,  abdomen or pelvis. Electronically Signed   By: Lovey Newcomer M.D.   On: 05/21/2018 15:42   Ct Abdomen Pelvis W Contrast  Result Date: 05/21/2018 CLINICAL DATA:  Patient with history of colon cancer diagnosed in 2018 status post chemotherapy and surgery. EXAM: CT CHEST, ABDOMEN, AND PELVIS WITH CONTRAST TECHNIQUE: Multidetector CT imaging of the chest, abdomen and pelvis was performed following the standard protocol during bolus administration of intravenous contrast. CONTRAST:  127m OMNIPAQUE IOHEXOL 300 MG/ML  SOLN COMPARISON:  CT abdomen pelvis 11/02/2017; CT chest 06/26/2017; CT abdomen pelvis 05/30/2017 FINDINGS: CT CHEST FINDINGS Cardiovascular: Normal heart size. No pericardial effusion. Aorta and main pulmonary artery normal in caliber. Mediastinum/Nodes: No enlarged axillary, mediastinal or hilar lymphadenopathy. Normal appearance of the esophagus. Lungs/Pleura: Central airways are patent. Dependent atelectasis within the bilateral lower lobes. No large area pulmonary consolidation. No pleural effusion or pneumothorax. Musculoskeletal: No aggressive or acute appearing osseous lesions. CT ABDOMEN PELVIS FINDINGS Hepatobiliary: Liver is normal in size and contour. No focal hepatic lesion is identified. Geographic regions of hyper attenuation demonstrated within the hepatic dome extending towards the caudate (image 53; series 2) (image 64; series 2), most compatible with focal fatty sparing. Normal appearance of the gallbladder. No intrahepatic or extrahepatic biliary ductal dilatation. Pancreas: Unremarkable Spleen: Unremarkable Adrenals/Urinary Tract: Normal adrenal glands. Kidneys enhance symmetrically with contrast. 5 mm stone superior pole left kidney. Subcentimeter too small to characterize low-attenuation lesions inferior pole left kidney. No hydronephrosis. Urinary bladder is unremarkable. Stomach/Bowel: Oral contrast material throughout the small large bowel. No evidence for bowel obstruction. No  free fluid or free intraperitoneal air. Normal morphology of the stomach. Stable postsurgical changes involving the sigmoid colon. Vascular/Lymphatic: Normal caliber abdominal aorta. No retroperitoneal lymphadenopathy. Reproductive: Enlarged heterogeneous prostate. Other: Small fat containing inguinal hernias bilaterally, left-greater-than-right. Musculoskeletal: Lumbar spine degenerative changes. No aggressive or acute appearing osseous lesions. IMPRESSION: 1. No definite evidence for metastatic disease within the chest, abdomen or pelvis. Electronically Signed   By: DLovey NewcomerM.D.   On: 05/21/2018 15:42    Medications: I have reviewed the patient's current medications.   Assessment/Plan:  1. Adenocarcinoma of the sigmoid colon, synchronous primary tumors separated by 2 cm, status post a sigmoid colectomy 05/26/2017 ? Stage III (T3N1) moderately differentiated adenocarcinoma, MSI-stable, no loss of mismatch repair protein expression, lymphovascular and perineural invasion noted ? Elevated preoperative CEA ? CT abdomen/pelvis 05/05/2018-wall thickening of the sigmoid colon, mesenteric nodes at the left common iliac, sigmoid diverticulosis ? CT chest 06/26/2017-negative for metastatic disease ? Cycle 1 CAPOX 06/29/2017 ? Cycle 2 CAPOX 07/20/2017 ? Cycle of Xeloda 08/17/2017 ? Cycle of Xeloda 09/09/2017 ? Cycle of Xeloda 09/30/2017 ? Cycle of Xeloda 11/06/2017 ? Cycle of Xeloda 11/27/2017  ? Cycle of Xeldoa 12/18/2017  ? CTs 05/21/2018- no evidence of recurrent colon cancer 2. Hypertension 3. Family history of colon cancer 4. High rectal polyp noted at rigid sigmoidoscopy during surgery 05/26/2017, no preoperative colonoscopy was performed  Colonoscopy 03/01/2018- hyperplastic polyp removed from the rectum 5. History of kidney stones 6. Mild renal insufficiency  Disposition: Mr. LOparais in clinical remission from colon cancer.  He will return for an office visit and CEA in 6 months. He has  intermittent mild elevation of the creatinine on chemistry panels here.  He most likely has mild renal insufficiency related to hypertension.  He plans to obtain a primary care physician for management of hypertension and follow-up of his renal function.    GBetsy Coder MD  05/22/2018  12:25 PM

## 2018-05-22 NOTE — Telephone Encounter (Signed)
Printed calendar and avs. °

## 2018-09-23 IMAGING — CT CT ABD-PELV W/ CM
2 of 5 series · 16 of 46 positions shown, 18 images · IV contrast (Omni 300)
Comparison: CT scan 05/05/2017

CLINICAL DATA: Generalized abdominal pain and fever. Laparoscopic
sigmoid colectomy 05/26/2017.

EXAM:
CT ABDOMEN AND PELVIS WITH CONTRAST
TECHNIQUE: Multidetector CT imaging of the abdomen and pelvis was performed
using the standard protocol following bolus administration of
intravenous contrast.
CONTRAST:  100mL AV9HWI-088 IOPAMIDOL ([id number]-088) INJECTION 61%

[Series 3: a/p w/ 5mm · axial · 0.92mm/px · z∈[+1036,+1531]mm · 13 of 111 slices shown, 15 images]
[im 6/111  soft-tissue]
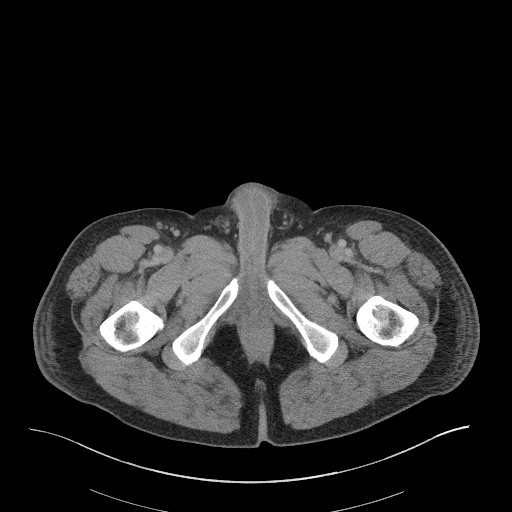
[im 6/111  bone]
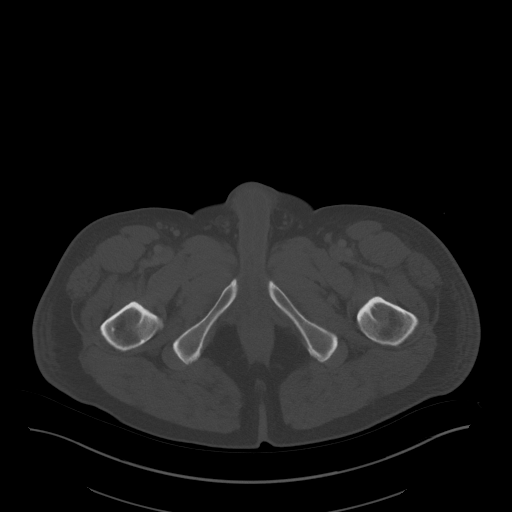
[im 18/111  soft-tissue]
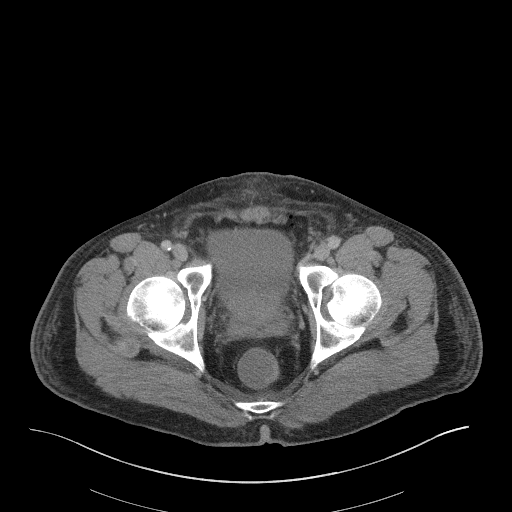
[im 24/111  soft-tissue]
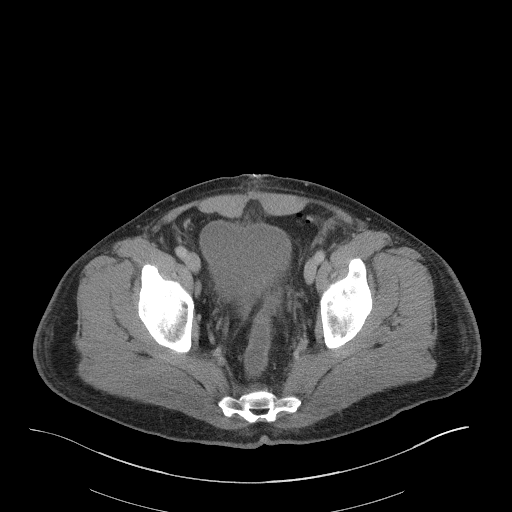
[im 29/111  soft-tissue]
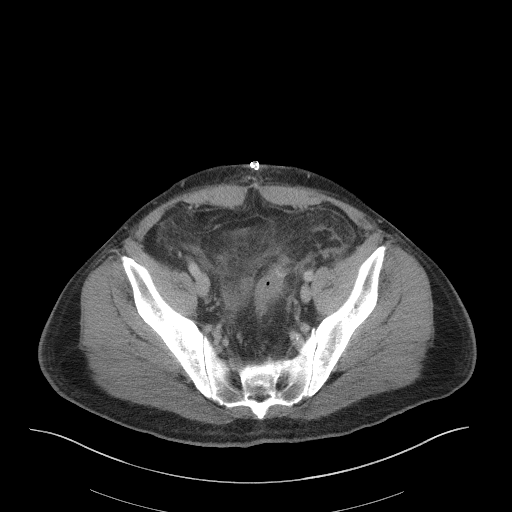
[im 41/111  soft-tissue]
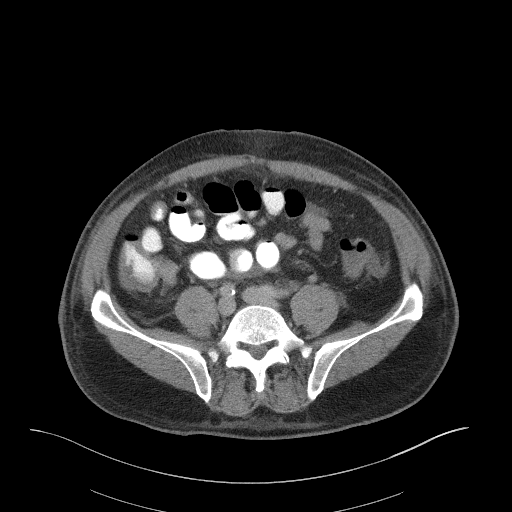
[im 47/111  soft-tissue]
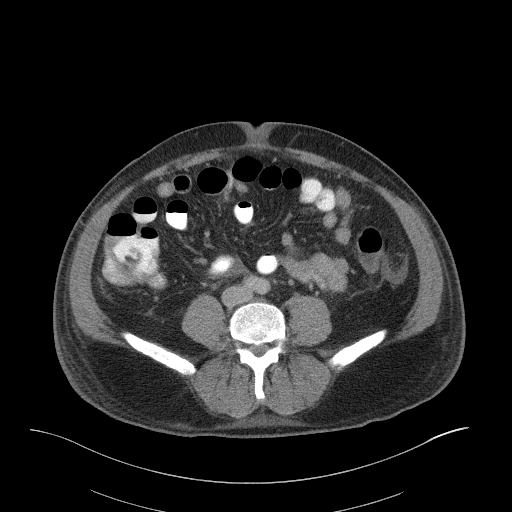
[im 58/111  soft-tissue]
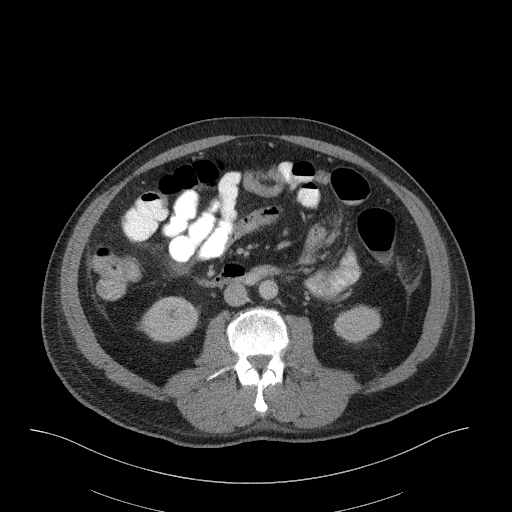
[im 64/111  soft-tissue]
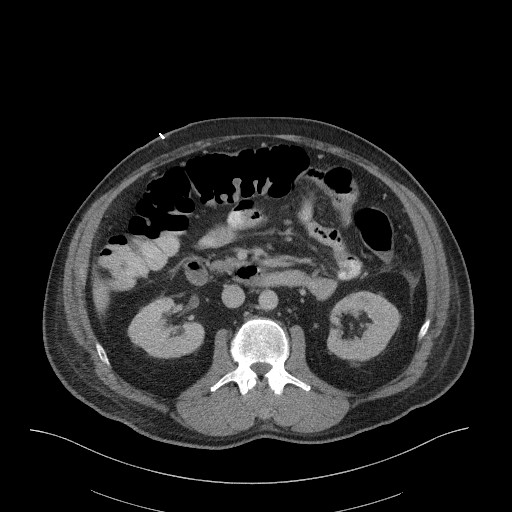
[im 70/111  soft-tissue]
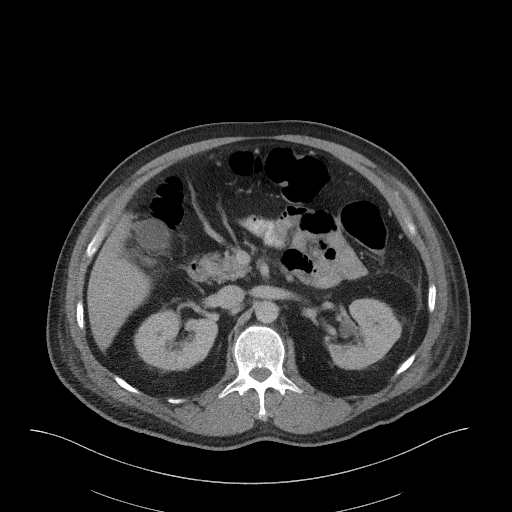
[im 70/111  bone]
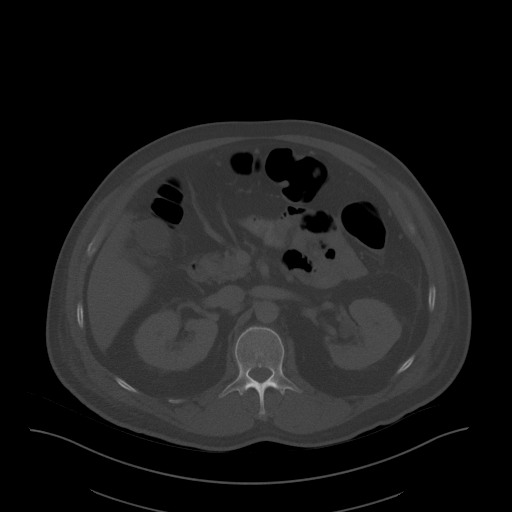
[im 82/111  soft-tissue]
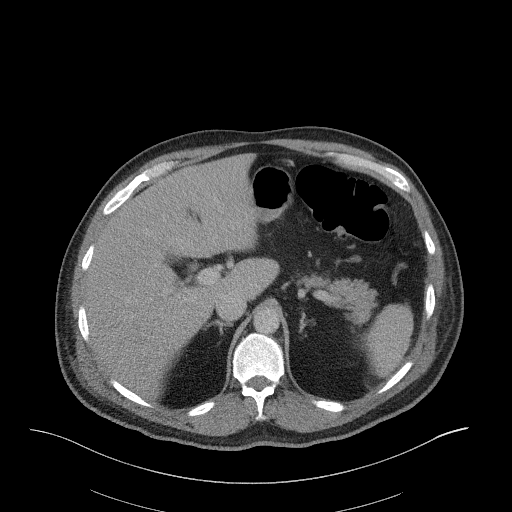
[im 87/111  soft-tissue]
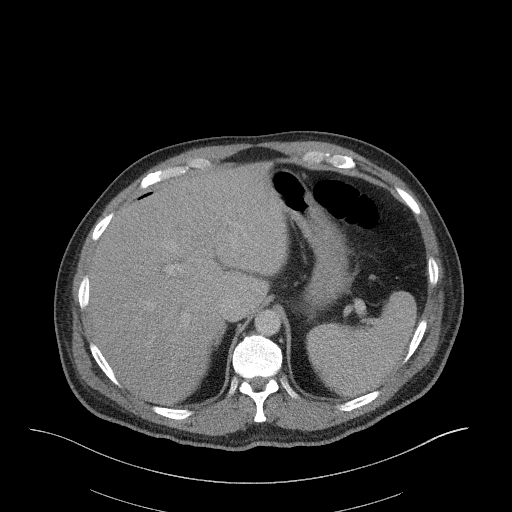
[im 93/111  soft-tissue]
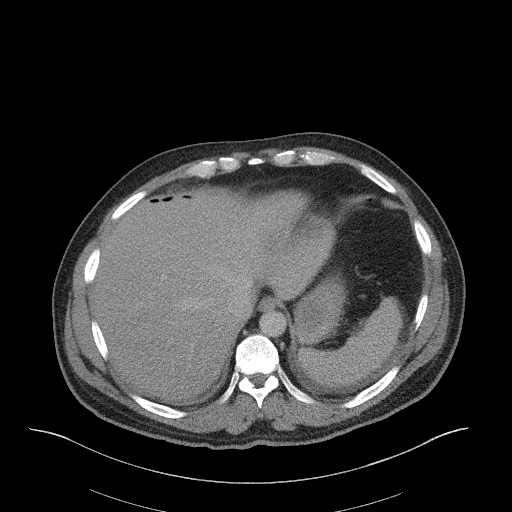
[im 105/111  soft-tissue]
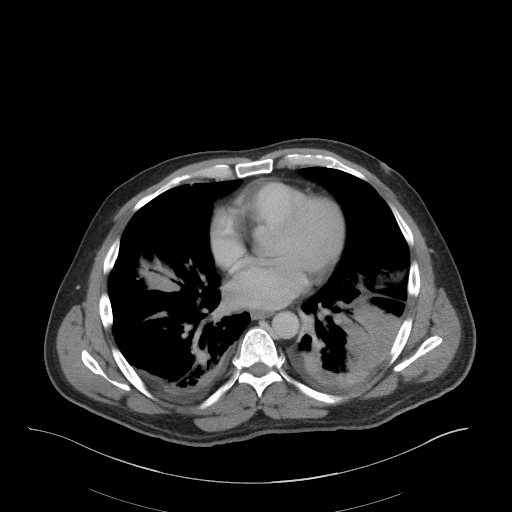

[Series 6: a/p w/ cor · coronal · 0.87mm/px · 3 of 162 slices shown]
[im 54/162  soft-tissue]
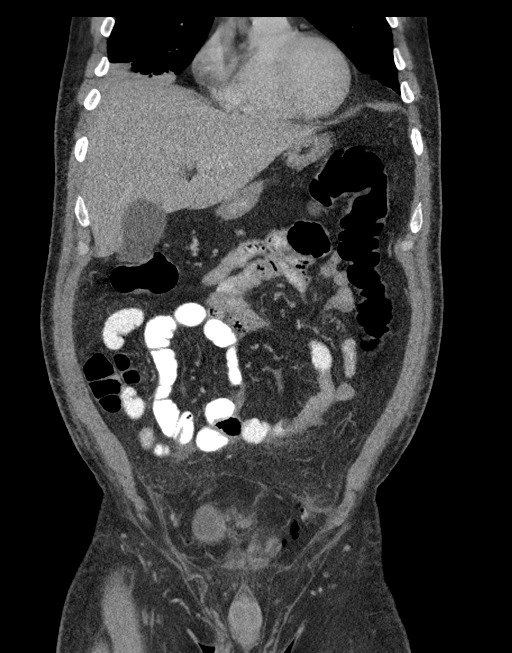
[im 72/162  soft-tissue]
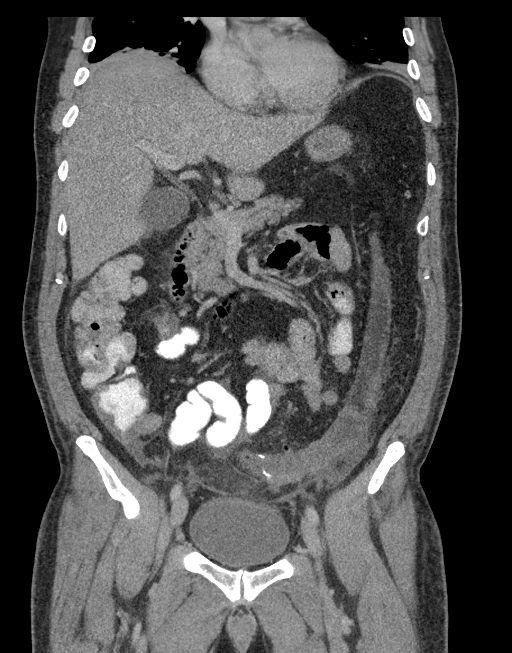
[im 90/162  soft-tissue]
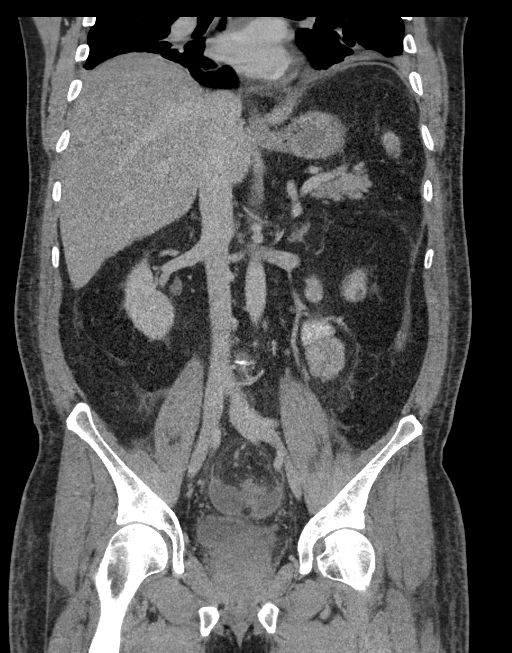

[16 of 46 positions shown; findings below may reference images not displayed]

FINDINGS: Lower chest: The lung bases demonstrates small bilateral pleural
effusions and moderate bibasilar atelectasis.

Hepatobiliary: No focal hepatic lesions or intrahepatic biliary
dilatation. The gallbladder is normal. No common bile duct
dilatation.

Pancreas: No mass, inflammation or ductal dilatation.

Spleen: Normal size.  No focal lesions.

Adrenals/Urinary Tract: The adrenal glands and kidneys are
unremarkable. No renal, ureteral or bladder calculi or mass.

Stomach/Bowel: The stomach, duodenum, small bowel and terminal ileum
are normal.

Surgical changes from recent colo colonic anastomosis. There are
pelvic fluid collections and a small amount of residual free
intraperitoneal air likely associated with the recent surgery. No
discrete abscess or evidence of perforation.

Vascular/Lymphatic: The aorta is normal in caliber. No dissection.
The branch vessels are patent. The major venous structures are
patent. No mesenteric or retroperitoneal mass or adenopathy. Small
scattered lymph nodes are noted.

Reproductive: The prostate gland is mildly enlarged. The seminal
vesicles appear normal.

Other: No inguinal mass or adenopathy. Small inguinal hernias
containing fat.

Musculoskeletal: No significant bony findings.
IMPRESSION: 1. Postoperative changes from recent partial sigmoid colon resection
and colocolonic anastomosis. Expected postoperative changes with
scattered fluid in the abdomen and pelvis and scattered free air. No
discrete abscess or large hematoma.
2. Small bilateral pleural effusions and bibasilar atelectasis.

## 2018-10-09 ENCOUNTER — Emergency Department (HOSPITAL_COMMUNITY): Payer: BLUE CROSS/BLUE SHIELD

## 2018-10-09 ENCOUNTER — Other Ambulatory Visit: Payer: Self-pay

## 2018-10-09 ENCOUNTER — Encounter (HOSPITAL_COMMUNITY): Payer: Self-pay | Admitting: Emergency Medicine

## 2018-10-09 ENCOUNTER — Emergency Department (HOSPITAL_COMMUNITY)
Admission: EM | Admit: 2018-10-09 | Discharge: 2018-10-09 | Disposition: A | Discharge status: Home / Self Care | Payer: BLUE CROSS/BLUE SHIELD | Attending: Emergency Medicine | Admitting: Emergency Medicine

## 2018-10-09 DIAGNOSIS — I10 Essential (primary) hypertension: Secondary | ICD-10-CM | POA: Diagnosis not present

## 2018-10-09 DIAGNOSIS — Z87442 Personal history of urinary calculi: Secondary | ICD-10-CM | POA: Insufficient documentation

## 2018-10-09 DIAGNOSIS — Z7982 Long term (current) use of aspirin: Secondary | ICD-10-CM | POA: Insufficient documentation

## 2018-10-09 DIAGNOSIS — N201 Calculus of ureter: Secondary | ICD-10-CM | POA: Insufficient documentation

## 2018-10-09 DIAGNOSIS — R319 Hematuria, unspecified: Secondary | ICD-10-CM | POA: Insufficient documentation

## 2018-10-09 DIAGNOSIS — R300 Dysuria: Secondary | ICD-10-CM | POA: Insufficient documentation

## 2018-10-09 DIAGNOSIS — Z79899 Other long term (current) drug therapy: Secondary | ICD-10-CM | POA: Diagnosis not present

## 2018-10-09 DIAGNOSIS — R112 Nausea with vomiting, unspecified: Secondary | ICD-10-CM | POA: Diagnosis not present

## 2018-10-09 DIAGNOSIS — R10815 Periumbilic abdominal tenderness: Secondary | ICD-10-CM | POA: Insufficient documentation

## 2018-10-09 DIAGNOSIS — N200 Calculus of kidney: Secondary | ICD-10-CM

## 2018-10-09 DIAGNOSIS — Z85038 Personal history of other malignant neoplasm of large intestine: Secondary | ICD-10-CM | POA: Diagnosis not present

## 2018-10-09 DIAGNOSIS — N400 Enlarged prostate without lower urinary tract symptoms: Secondary | ICD-10-CM | POA: Diagnosis not present

## 2018-10-09 DIAGNOSIS — R1032 Left lower quadrant pain: Secondary | ICD-10-CM | POA: Diagnosis not present

## 2018-10-09 DIAGNOSIS — N130 Hydronephrosis with ureteropelvic junction obstruction: Secondary | ICD-10-CM | POA: Diagnosis not present

## 2018-10-09 DIAGNOSIS — Q6211 Congenital occlusion of ureteropelvic junction: Secondary | ICD-10-CM

## 2018-10-09 DIAGNOSIS — N202 Calculus of kidney with calculus of ureter: Secondary | ICD-10-CM | POA: Diagnosis not present

## 2018-10-09 LAB — URINALYSIS, ROUTINE W REFLEX MICROSCOPIC
Bacteria, UA: NONE SEEN
Bilirubin Urine: NEGATIVE
Glucose, UA: NEGATIVE mg/dL
Ketones, ur: NEGATIVE mg/dL
Leukocytes,Ua: NEGATIVE
Nitrite: NEGATIVE
Protein, ur: NEGATIVE mg/dL
Specific Gravity, Urine: 1.024 (ref 1.005–1.030)
pH: 5 (ref 5.0–8.0)

## 2018-10-09 LAB — CBC
HCT: 44.4 % (ref 39.0–52.0)
Hemoglobin: 15.2 g/dL (ref 13.0–17.0)
MCH: 29.6 pg (ref 26.0–34.0)
MCHC: 34.2 g/dL (ref 30.0–36.0)
MCV: 86.4 fL (ref 80.0–100.0)
Platelets: 294 10*3/uL (ref 150–400)
RBC: 5.14 MIL/uL (ref 4.22–5.81)
RDW: 13.1 % (ref 11.5–15.5)
WBC: 13.5 10*3/uL — ABNORMAL HIGH (ref 4.0–10.5)
nRBC: 0 % (ref 0.0–0.2)

## 2018-10-09 LAB — COMPREHENSIVE METABOLIC PANEL
ALT: 20 U/L (ref 0–44)
AST: 16 U/L (ref 15–41)
Albumin: 3.8 g/dL (ref 3.5–5.0)
Alkaline Phosphatase: 72 U/L (ref 38–126)
Anion gap: 11 (ref 5–15)
BUN: 20 mg/dL (ref 6–20)
CO2: 25 mmol/L (ref 22–32)
Calcium: 9.5 mg/dL (ref 8.9–10.3)
Chloride: 101 mmol/L (ref 98–111)
Creatinine, Ser: 1.94 mg/dL — ABNORMAL HIGH (ref 0.61–1.24)
GFR calc Af Amer: 43 mL/min — ABNORMAL LOW (ref >60)
GFR calc non Af Amer: 37 mL/min — ABNORMAL LOW (ref >60)
Glucose, Bld: 123 mg/dL — ABNORMAL HIGH (ref 70–99)
Potassium: 3.7 mmol/L (ref 3.5–5.1)
Sodium: 137 mmol/L (ref 135–145)
Total Bilirubin: 1.1 mg/dL (ref 0.3–1.2)
Total Protein: 7.6 g/dL (ref 6.5–8.1)

## 2018-10-09 LAB — LIPASE, BLOOD: Lipase: 32 U/L (ref 11–51)

## 2018-10-09 MED ORDER — HYDROMORPHONE HCL 1 MG/ML IJ SOLN
2.0000 mg | Freq: Once | INTRAMUSCULAR | Status: AC
  Administered 2018-10-09: 14:00:00 2 mg via INTRAVENOUS
  Filled 2018-10-09: qty 2

## 2018-10-09 MED ORDER — KETOROLAC TROMETHAMINE 15 MG/ML IJ SOLN
15.0000 mg | Freq: Once | INTRAMUSCULAR | Status: AC
  Administered 2018-10-09: 12:00:00 15 mg via INTRAVENOUS
  Filled 2018-10-09: qty 1

## 2018-10-09 MED ORDER — HYDROMORPHONE HCL 2 MG PO TABS: 2.0000 mg | ORAL_TABLET | ORAL | 0 refills | Status: DC | PRN

## 2018-10-09 MED ORDER — TAMSULOSIN HCL 0.4 MG PO CAPS
0.4000 mg | ORAL_CAPSULE | Freq: Once | ORAL | Status: AC
  Administered 2018-10-09: 14:00:00 0.4 mg via ORAL
  Filled 2018-10-09: qty 1

## 2018-10-09 MED ORDER — HYDROMORPHONE HCL 1 MG/ML IJ SOLN
1.0000 mg | Freq: Once | INTRAMUSCULAR | Status: AC
  Administered 2018-10-09: 12:00:00 1 mg via INTRAVENOUS
  Filled 2018-10-09: qty 1

## 2018-10-09 MED ORDER — SODIUM CHLORIDE 0.9 % IV BOLUS (SEPSIS)
1000.0000 mL | Freq: Once | INTRAVENOUS | Status: AC
  Administered 2018-10-09: 12:00:00 1000 mL via INTRAVENOUS

## 2018-10-09 MED ORDER — HYDROMORPHONE HCL 1 MG/ML IJ SOLN
0.5000 mg | Freq: Once | INTRAMUSCULAR | Status: AC
  Administered 2018-10-09: 0.5 mg via INTRAVENOUS
  Filled 2018-10-09: qty 1

## 2018-10-09 MED ORDER — TAMSULOSIN HCL 0.4 MG PO CAPS: 0.4000 mg | ORAL_CAPSULE | Freq: Every day | ORAL | 0 refills | Status: DC

## 2018-10-09 MED ORDER — ONDANSETRON HCL 4 MG/2ML IJ SOLN
4.0000 mg | Freq: Once | INTRAMUSCULAR | Status: AC
  Administered 2018-10-09: 11:00:00 4 mg via INTRAVENOUS
  Filled 2018-10-09: qty 2

## 2018-10-09 MED ORDER — ONDANSETRON HCL 4 MG PO TABS: 4.0000 mg | ORAL_TABLET | Freq: Four times a day (QID) | ORAL | 0 refills | Status: DC

## 2018-10-09 MED ORDER — SODIUM CHLORIDE 0.9 % IV SOLN
1.0000 g | Freq: Once | INTRAVENOUS | Status: DC
  Administered 2018-10-09: 1 g via INTRAVENOUS
  Filled 2018-10-09: qty 10

## 2018-10-09 NOTE — ED Notes (Signed)
Patient transported to CT 

## 2018-10-09 NOTE — ED Notes (Signed)
ED Provider at bedside. 

## 2018-10-09 NOTE — ED Provider Notes (Signed)
[place] EMERGENCY DEPARTMENT Provider Note   CSN: [id number] Arrival date & time: 10/09/18  1041    History   Chief Complaint Chief Complaint  Anthony Knight presents with  . Abdominal Pain  . Flank Pain    HPI Anthony Knight is a 58 y.o. male.     Anthony Knight is a 58 year old gentleman with past medical history of colon cancer who presents emergency department for left-sided flank pain abdominal pain.  Anthony Knight reports that this started about 3 days ago.  Anthony Knight reports that this feels similar to when he had a kidney stone in the past.  Reports that he has had decreased urine output and some hematuria.  Reports nausea and vomiting without fever or chills.  Denies any diarrhea.  Reports that the pain is constant and there are no exacerbating or relieving factors.     Past Medical History:  Diagnosis Date  . Cancer [facility])    colon cancer  . History of kidney stones    early 2018  . Hypertension   . Stricture of sigmoid colon [facility])     Anthony Knight Active Problem List   Diagnosis Date Noted  . Port-A-Cath in place 08/10/2017  . Cancer of sigmoid colon ([place]) 06/15/2017  . Colonic stricture ([place]) 05/26/2017  . Headache 01/26/2017  . Essential hypertension 07/07/2015  . Lightheadedness 07/07/2015  . BPH (benign prostatic hyperplasia) 02/18/2015    Past Surgical History:  Procedure Laterality Date  . COLONOSCOPY N/A 03/01/2018   Procedure: COLONOSCOPY;  Surgeon: Ileana Roup, MD;  Location: [facility];  Service: General;  Laterality: N/A;  . LAPAROSCOPIC SIGMOID COLECTOMY N/A 05/26/2017   Procedure: LAPAROSCOPIC ASSISTED SIGMOID COLECTOMY;  Surgeon: Jovita Kussmaul, MD;  Location: [facility];  Service: General;  Laterality: N/A;  ERAS PATHWAY  . PORT-A-CATH REMOVAL N/A 03/01/2018   Procedure: REMOVAL PORT-A-CATH;  Surgeon: Jovita Kussmaul, MD;  Location: [facility];  Service: General;  Laterality: N/A;  . PORTACATH PLACEMENT Left 06/22/2017   Procedure: INSERTION  PORT-A-CATH;  Surgeon: Jovita Kussmaul, MD;  Location: [place];  Service: General;  Laterality: Left;  . rectal tear     Per Anthony Knight report  . SIGMOIDOSCOPY N/A 05/26/2017   Procedure: RIGID SIGMOIDOSCOPY;  Surgeon: Jovita Kussmaul, MD;  Location: [place];  Service: General;  Laterality: N/A;  . URETERAL STENT PLACEMENT  2002   kidney stones removal as well        Home Medications    Prior to Admission medications   Medication Sig Start Date End Date Taking? Authorizing Provider  aspirin EC 81 MG tablet Take 1 tablet (81 mg total) by mouth daily. 02/18/15   Coral Spikes, DO  hydrochlorothiazide (HYDRODIURIL) 25 MG tablet Take 25 mg by mouth daily. 04/09/18 04/09/19  [provider]  HYDROmorphone (DILAUDID) 2 MG tablet Take 1 tablet (2 mg total) by mouth every 4 (four) hours as needed for up to 1 day for severe pain. 10/09/18 10/10/18  Madilyn Hook A, PA-C  losartan (COZAAR) 100 MG tablet Take 100 mg by mouth daily.     [provider]  ondansetron (ZOFRAN) 4 MG tablet Take 1 tablet (4 mg total) by mouth every 6 (six) hours. 10/09/18   Alveria Apley, PA-C  oxyCODONE-acetaminophen (PERCOCET) 5-325 MG tablet Take 1 tablet by mouth every 8 (eight) hours as needed for severe pain. Anthony Knight not taking: Reported on 05/22/2018 03/01/18   Autumn Messing III, MD  tamsulosin [facility]) 0.4 MG  CAPS capsule Take 1 capsule (0.4 mg total) by mouth daily for 5 days. 10/09/18 10/14/18  Alveria Apley, PA-C    Family History Family History  Problem Relation Age of Onset  . Cancer Father        prostate  . Cancer Maternal Grandfather        lung and colon  . Cancer Maternal Uncle        colon  . Cancer Maternal Uncle        spleen  . Cancer Maternal Uncle     Social History Social History   Tobacco Use  . Smoking status: Former Smoker    Last attempt to quit: 02/17/1985    Years since quitting: 33.6  . Smokeless tobacco: Never Used  Substance Use Topics  . Alcohol  use: Yes    Alcohol/week: 0.0 - 1.0 standard drinks    Comment: very rare  . Drug use: No     Allergies   Anthony Knight has no known allergies.   Review of Systems Review of Systems  Constitutional: Positive for appetite change. Negative for activity change, chills, fatigue and fever.  Respiratory: Negative for shortness of breath.   Cardiovascular: Negative for chest pain.  Gastrointestinal: Positive for abdominal pain, nausea and vomiting. Negative for constipation and diarrhea.  Endocrine: Negative for polyuria.  Genitourinary: Positive for decreased urine volume, difficulty urinating, dysuria, flank pain and hematuria. Negative for discharge, penile pain, penile swelling and scrotal swelling.  Musculoskeletal: Positive for back pain.  Skin: Negative for rash.  Allergic/Immunologic: Negative for immunocompromised state.  Neurological: Negative for dizziness.  Hematological: Does not bruise/bleed easily.     Physical Exam Updated Vital Signs BP 117/77   Pulse 65   Temp 98 F (36.7 C) (Oral)   Resp 16   Wt 100.3 kg   SpO2 93%   BMI 29.99 kg/m   Physical Exam Vitals signs and nursing note reviewed.  Constitutional:      Appearance: He is well-developed.  HENT:     Head: Normocephalic and atraumatic.  Eyes:     Conjunctiva/sclera: Conjunctivae normal.     Pupils: Pupils are equal, round, and reactive to light.  Neck:     Musculoskeletal: Neck supple.  Cardiovascular:     Rate and Rhythm: Normal rate and regular rhythm.     Heart sounds: No murmur.  Pulmonary:     Effort: Pulmonary effort is normal. No respiratory distress.     Breath sounds: Normal breath sounds.  Abdominal:     Palpations: Abdomen is soft.     Tenderness: There is abdominal tenderness in the suprapubic area and left lower quadrant. There is no right CVA tenderness, left CVA tenderness, guarding or rebound.  Skin:    General: Skin is warm and dry.  Neurological:     Mental Status: He is alert.   Psychiatric:        Mood and Affect: Mood normal.      ED Treatments / Results  Labs (all labs ordered are listed, but only abnormal results are displayed) Labs Reviewed  COMPREHENSIVE METABOLIC PANEL - Abnormal; Notable for the following components:      Result Value   Glucose, Bld 123 (*)    Creatinine, Ser 1.94 (*)    GFR calc non Af Amer 37 (*)    GFR calc Af Amer 43 (*)    All other components within normal limits  CBC - Abnormal; Notable for the following components:   WBC 13.5 (*)  All other components within normal limits  URINALYSIS, ROUTINE W REFLEX MICROSCOPIC - Abnormal; Notable for the following components:   Hgb urine dipstick MODERATE (*)    All other components within normal limits  LIPASE, BLOOD    EKG None  Radiology Ct Renal Stone Study  Result Date: 10/09/2018 CLINICAL DATA:  Left flank pain today EXAM: CT ABDOMEN AND PELVIS WITHOUT CONTRAST TECHNIQUE: Multidetector CT imaging of the abdomen and pelvis was performed following the standard protocol without IV contrast. COMPARISON:  05/21/2018 FINDINGS: Lower chest: Dependent atelectasis. Hepatobiliary: Diffuse hepatic steatosis with relative sparing next to the gallbladder. Unremarkable gallbladder. Pancreas: Unremarkable Spleen: Unremarkable Adrenals/Urinary Tract: Mild left hydronephrosis and perinephric stranding. 6 mm calculus at the left ureteropelvic junction. There is stranding along the course of the left ureter. Small punctate calculus in the lower pole of the right kidney unremarkable adrenal glands. Bladder is decompressed. Stomach/Bowel: Postoperative changes from sigmoid resection. No obvious mass in the colon. Normal appendix. No evidence of small-bowel distention. Unremarkable stomach. Vascular/Lymphatic: Minimal atherosclerotic vascular calcifications. No abnormal retroperitoneal adenopathy. Reproductive: Prostate is enlarged and lobulated. Other: No free-fluid.  Left inguinal hernia contains  adipose tissue. Musculoskeletal: No vertebral compression deformity. IMPRESSION: 6 mm left UPJ calculus is associated with secondary findings of left ureteral obstruction. Right nephrolithiasis. Prostate enlargement.  Correlate with physical exam and PSA. Electronically Signed   By: Marybelle Killings M.D.   On: 10/09/2018 12:48    Procedures Procedures (including critical care time)  Medications Ordered in ED Medications  tamsulosin (FLOMAX) capsule 0.4 mg (has no administration in time range)  HYDROmorphone (DILAUDID) injection 2 mg (has no administration in time range)  HYDROmorphone (DILAUDID) injection 0.5 mg (0.5 mg Intravenous Given 10/09/18 1121)  ondansetron (ZOFRAN) injection 4 mg (4 mg Intravenous Given 10/09/18 1124)  ketorolac (TORADOL) 15 MG/ML injection 15 mg (15 mg Intravenous Given 10/09/18 1214)  sodium chloride 0.9 % bolus 1,000 mL (0 mLs Intravenous Stopped 10/09/18 1349)  HYDROmorphone (DILAUDID) injection 1 mg (1 mg Intravenous Given 10/09/18 1215)     Initial Impression / Assessment and Plan / ED Course  I have reviewed the triage vital signs and the nursing notes.  Pertinent labs & imaging results that were available during my care of the Anthony Knight were reviewed by me and considered in my medical decision making (see chart for details).  Clinical Course as of Oct 09 1419  Tue Oct 09, 2018  1327 Anthony Knight with with L flank pain x3 days, hx of kidney stone. Afebrile. Has 27mm stone at Left UPJ with perinephric stranding and WBC count of 13.5, Stable and not septic (vitals normal with bp of 129/85) . I have given rocephin and pain is controlled with fluids, toradol and dilaudid. Pending urology consult at this time   [KM]  1410 Consulted with Dr. Tresa Moore urology who stated that Anthony Knight is able to go home but can follow-up with him in the office tomorrow morning to set up procedure.  He suggested to give 2 mg Dilaudid oral tablets, Zofran, tamsulosin until follow-up.  Anthony Knight's pain is  controlled and agrees to follow-up tomorrow morning to Dr. Zettie Pho office.   [KM]    Clinical Course User Index [KM] Alveria Apley, PA-C         Final Clinical Impressions(s) / ED Diagnoses   Final diagnoses:  Kidney stone  Hydronephrosis with ureteropelvic junction (UPJ) obstruction    ED Discharge Orders         Ordered    HYDROmorphone (  DILAUDID) 2 MG tablet  Every 4 hours PRN     10/09/18 1420    ondansetron (ZOFRAN) 4 MG tablet  Every 6 hours     10/09/18 1420    tamsulosin (FLOMAX) 0.4 MG CAPS capsule  Daily     10/09/18 1420           Kristine Royal 10/09/18 1421    Margette Fast, MD 10/10/18 [phone]

## 2018-10-09 NOTE — ED Notes (Signed)
Patient back from CT.

## 2018-10-09 NOTE — ED Triage Notes (Signed)
Pt in with c/o L abdominal and flank pain x 3 days. States pain has gotten sharper now. Denies any hematuria or fevers.

## 2018-10-09 NOTE — Discharge Instructions (Signed)
Thank you for allowing me to care for you today. Please return to the emergency department if you have new or worsening symptoms. Take your medications as instructed.  ° °

## 2018-10-09 NOTE — ED Notes (Signed)
Urine Culture sent to Main Lab with UA 

## 2018-10-10 ENCOUNTER — Ambulatory Visit (HOSPITAL_COMMUNITY): Payer: BLUE CROSS/BLUE SHIELD | Admitting: Registered Nurse

## 2018-10-10 ENCOUNTER — Ambulatory Visit (HOSPITAL_COMMUNITY): Payer: BLUE CROSS/BLUE SHIELD

## 2018-10-10 ENCOUNTER — Ambulatory Visit (HOSPITAL_COMMUNITY)
Admission: RE | Admit: 2018-10-10 | Discharge: 2018-10-10 | Disposition: A | Discharge status: Home / Self Care | Payer: BLUE CROSS/BLUE SHIELD | Attending: Urology | Admitting: Urology

## 2018-10-10 ENCOUNTER — Encounter (HOSPITAL_COMMUNITY)
Admission: RE | Disposition: A | Discharge status: Home / Self Care | Payer: Self-pay | Source: Home / Self Care | Attending: Urology

## 2018-10-10 ENCOUNTER — Encounter (HOSPITAL_COMMUNITY): Payer: Self-pay | Admitting: *Deleted

## 2018-10-10 ENCOUNTER — Other Ambulatory Visit: Payer: Self-pay | Admitting: Urology

## 2018-10-10 ENCOUNTER — Other Ambulatory Visit (HOSPITAL_COMMUNITY)
Admission: RE | Admit: 2018-10-10 | Discharge: 2018-10-10 | Disposition: A | Discharge status: Home / Self Care | Payer: BLUE CROSS/BLUE SHIELD | Source: Ambulatory Visit | Attending: Urology | Admitting: Urology

## 2018-10-10 ENCOUNTER — Other Ambulatory Visit: Payer: Self-pay

## 2018-10-10 DIAGNOSIS — N202 Calculus of kidney with calculus of ureter: Secondary | ICD-10-CM | POA: Diagnosis not present

## 2018-10-10 DIAGNOSIS — Z1159 Encounter for screening for other viral diseases: Secondary | ICD-10-CM | POA: Diagnosis not present

## 2018-10-10 DIAGNOSIS — I10 Essential (primary) hypertension: Secondary | ICD-10-CM | POA: Diagnosis not present

## 2018-10-10 DIAGNOSIS — N433 Hydrocele, unspecified: Secondary | ICD-10-CM | POA: Diagnosis not present

## 2018-10-10 DIAGNOSIS — Z85038 Personal history of other malignant neoplasm of large intestine: Secondary | ICD-10-CM | POA: Insufficient documentation

## 2018-10-10 DIAGNOSIS — Z87891 Personal history of nicotine dependence: Secondary | ICD-10-CM | POA: Insufficient documentation

## 2018-10-10 DIAGNOSIS — N170 Acute kidney failure with tubular necrosis: Secondary | ICD-10-CM | POA: Diagnosis not present

## 2018-10-10 DIAGNOSIS — N201 Calculus of ureter: Secondary | ICD-10-CM | POA: Diagnosis not present

## 2018-10-10 DIAGNOSIS — N400 Enlarged prostate without lower urinary tract symptoms: Secondary | ICD-10-CM | POA: Diagnosis not present

## 2018-10-10 HISTORY — PX: CYSTOSCOPY W/ URETERAL STENT PLACEMENT: SHX1429

## 2018-10-10 LAB — SARS CORONAVIRUS 2 BY RT PCR (HOSPITAL ORDER, PERFORMED IN ~~LOC~~ HOSPITAL LAB): SARS Coronavirus 2: NEGATIVE

## 2018-10-10 SURGERY — CYSTOSCOPY, WITH RETROGRADE PYELOGRAM AND URETERAL STENT INSERTION
Anesthesia: General | Laterality: Bilateral

## 2018-10-10 MED ORDER — DEXAMETHASONE SODIUM PHOSPHATE 10 MG/ML IJ SOLN
INTRAMUSCULAR | Status: DC | PRN
  Administered 2018-10-10: 8 mg via INTRAVENOUS

## 2018-10-10 MED ORDER — OXYCODONE HCL 5 MG PO TABS: 5.0000 mg | ORAL_TABLET | Freq: Once | ORAL | Status: DC | PRN

## 2018-10-10 MED ORDER — ONDANSETRON HCL 4 MG/2ML IJ SOLN
INTRAMUSCULAR | Status: DC | PRN
  Administered 2018-10-10: 4 mg via INTRAVENOUS

## 2018-10-10 MED ORDER — MEPERIDINE HCL 50 MG/ML IJ SOLN: 6.2500 mg | INTRAMUSCULAR | Status: DC | PRN

## 2018-10-10 MED ORDER — ONDANSETRON HCL 4 MG/2ML IJ SOLN
INTRAMUSCULAR | Status: AC
  Filled 2018-10-10: qty 2

## 2018-10-10 MED ORDER — BELLADONNA ALKALOIDS-OPIUM 16.2-30 MG RE SUPP
RECTAL | Status: AC
  Filled 2018-10-10: qty 1

## 2018-10-10 MED ORDER — ACETAMINOPHEN 160 MG/5ML PO SOLN: 325.0000 mg | ORAL | Status: DC | PRN

## 2018-10-10 MED ORDER — OXYCODONE HCL 5 MG/5ML PO SOLN: 5.0000 mg | Freq: Once | ORAL | Status: DC | PRN

## 2018-10-10 MED ORDER — SENNOSIDES-DOCUSATE SODIUM 8.6-50 MG PO TABS: 1.0000 | ORAL_TABLET | Freq: Two times a day (BID) | ORAL | 0 refills | Status: DC

## 2018-10-10 MED ORDER — EPHEDRINE 5 MG/ML INJ
INTRAVENOUS | Status: AC
  Filled 2018-10-10: qty 10

## 2018-10-10 MED ORDER — MIDAZOLAM HCL 5 MG/5ML IJ SOLN
INTRAMUSCULAR | Status: DC | PRN
  Administered 2018-10-10: 2 mg via INTRAVENOUS

## 2018-10-10 MED ORDER — LIDOCAINE HCL URETHRAL/MUCOSAL 2 % EX GEL
CUTANEOUS | Status: AC
  Filled 2018-10-10: qty 5

## 2018-10-10 MED ORDER — CEPHALEXIN 500 MG PO CAPS: 500.0000 mg | ORAL_CAPSULE | Freq: Two times a day (BID) | ORAL | 0 refills | Status: AC

## 2018-10-10 MED ORDER — HYDROMORPHONE HCL 2 MG PO TABS: 2.0000 mg | ORAL_TABLET | ORAL | 0 refills | Status: AC | PRN

## 2018-10-10 MED ORDER — FENTANYL CITRATE (PF) 100 MCG/2ML IJ SOLN
INTRAMUSCULAR | Status: DC | PRN
  Administered 2018-10-10: 25 ug via INTRAVENOUS
  Administered 2018-10-10: 50 ug via INTRAVENOUS
  Administered 2018-10-10 (×2): 25 ug via INTRAVENOUS

## 2018-10-10 MED ORDER — FENTANYL CITRATE (PF) 100 MCG/2ML IJ SOLN
INTRAMUSCULAR | Status: AC
  Filled 2018-10-10: qty 2

## 2018-10-10 MED ORDER — ONDANSETRON HCL 4 MG/2ML IJ SOLN: 4.0000 mg | Freq: Once | INTRAMUSCULAR | Status: DC | PRN

## 2018-10-10 MED ORDER — SUCCINYLCHOLINE CHLORIDE 200 MG/10ML IV SOSY
PREFILLED_SYRINGE | INTRAVENOUS | Status: AC
  Filled 2018-10-10: qty 10

## 2018-10-10 MED ORDER — SODIUM CHLORIDE 0.9 % IV SOLN
INTRAVENOUS | Status: AC
  Filled 2018-10-10: qty 20

## 2018-10-10 MED ORDER — LIDOCAINE 2% (20 MG/ML) 5 ML SYRINGE
INTRAMUSCULAR | Status: AC
  Filled 2018-10-10: qty 5

## 2018-10-10 MED ORDER — EPHEDRINE SULFATE-NACL 50-0.9 MG/10ML-% IV SOSY
PREFILLED_SYRINGE | INTRAVENOUS | Status: DC | PRN
  Administered 2018-10-10 (×2): 5 mg via INTRAVENOUS

## 2018-10-10 MED ORDER — MIDAZOLAM HCL 2 MG/2ML IJ SOLN
INTRAMUSCULAR | Status: AC
  Filled 2018-10-10: qty 2

## 2018-10-10 MED ORDER — PROPOFOL 10 MG/ML IV BOLUS
INTRAVENOUS | Status: DC | PRN
  Administered 2018-10-10: 200 mg via INTRAVENOUS

## 2018-10-10 MED ORDER — DEXAMETHASONE SODIUM PHOSPHATE 10 MG/ML IJ SOLN
INTRAMUSCULAR | Status: AC
  Filled 2018-10-10: qty 1

## 2018-10-10 MED ORDER — FENTANYL CITRATE (PF) 100 MCG/2ML IJ SOLN
25.0000 ug | INTRAMUSCULAR | Status: DC | PRN
  Administered 2018-10-10 (×2): 50 ug via INTRAVENOUS

## 2018-10-10 MED ORDER — LIDOCAINE 2% (20 MG/ML) 5 ML SYRINGE
INTRAMUSCULAR | Status: DC | PRN
  Administered 2018-10-10: 80 mg via INTRAVENOUS

## 2018-10-10 MED ORDER — SODIUM CHLORIDE 0.9 % IR SOLN
Status: DC | PRN
  Administered 2018-10-10 (×2): 3000 mL

## 2018-10-10 MED ORDER — SODIUM CHLORIDE 0.9 % IV SOLN
2.0000 g | INTRAVENOUS | Status: AC
  Administered 2018-10-10: 2 g via INTRAVENOUS

## 2018-10-10 MED ORDER — LACTATED RINGERS IV SOLN
INTRAVENOUS | Status: DC
  Administered 2018-10-10: 12:00:00 via INTRAVENOUS

## 2018-10-10 MED ORDER — PROPOFOL 10 MG/ML IV BOLUS
INTRAVENOUS | Status: AC
  Filled 2018-10-10: qty 20

## 2018-10-10 MED ORDER — ACETAMINOPHEN 325 MG PO TABS: 325.0000 mg | ORAL_TABLET | ORAL | Status: DC | PRN

## 2018-10-10 MED ORDER — SODIUM CHLORIDE 0.9 % IV SOLN
INTRAVENOUS | Status: DC | PRN
  Administered 2018-10-10: 13:00:00 50 mL

## 2018-10-10 SURGICAL SUPPLY — 18 items
BAG URO CATCHER STRL LF (MISCELLANEOUS) ×2 IMPLANT
BASKET ZERO TIP NITINOL 2.4FR (BASKET) ×2 IMPLANT
CATH INTERMIT  6FR 70CM (CATHETERS) IMPLANT
CLOTH BEACON ORANGE TIMEOUT ST (SAFETY) ×2 IMPLANT
COVER WAND RF STERILE (DRAPES) IMPLANT
FIBER LASER TRAC TIP (UROLOGICAL SUPPLIES) ×2 IMPLANT
GLOVE BIOGEL M STRL SZ7.5 (GLOVE) ×2 IMPLANT
GOWN STRL REUS W/TWL LRG LVL3 (GOWN DISPOSABLE) ×4 IMPLANT
GUIDEWIRE ANG ZIPWIRE 038X150 (WIRE) ×2 IMPLANT
GUIDEWIRE STR DUAL SENSOR (WIRE) ×4 IMPLANT
KIT TURNOVER KIT A (KITS) IMPLANT
MANIFOLD NEPTUNE II (INSTRUMENTS) ×2 IMPLANT
PACK CYSTO (CUSTOM PROCEDURE TRAY) ×2 IMPLANT
SHEATH URETERAL 12FRX35CM (MISCELLANEOUS) ×2 IMPLANT
STENT POLARIS 5FRX26 (STENTS) ×4 IMPLANT
TUBE FEEDING 8FR 16IN STR KANG (MISCELLANEOUS) ×2 IMPLANT
TUBING CONNECTING 10 (TUBING) ×2 IMPLANT
TUBING UROLOGY SET (TUBING) IMPLANT

## 2018-10-10 NOTE — Anesthesia Preprocedure Evaluation (Signed)
Anesthesia Evaluation  Patient identified by MRN, date of birth, ID band Patient awake    Reviewed: Allergy & Precautions, NPO status , Patient's Chart, lab work & pertinent test results  History of Anesthesia Complications Negative for: history of anesthetic complications  Airway Mallampati: II  TM Distance: >3 FB Neck ROM: Full    Dental  (+) Chipped, Dental Advisory Given   Pulmonary former smoker,    breath sounds clear to auscultation       Cardiovascular hypertension, Pt. on medications (-) angina Rhythm:Regular Rate:Normal     Neuro/Psych  Headaches,    GI/Hepatic Neg liver ROS, GERD  Medicated and Controlled,H/o colon cancer   Endo/Other  negative endocrine ROS  Renal/GU negative Renal ROS     Musculoskeletal   Abdominal (+) + obese,   Peds  Hematology negative hematology ROS (+)   Anesthesia Other Findings   Reproductive/Obstetrics                             Anesthesia Physical  Anesthesia Plan  ASA: II  Anesthesia Plan: General   Post-op Pain Management:    Induction: Intravenous  PONV Risk Score and Plan: 1 and Treatment may vary due to age or medical condition and Ondansetron  Airway Management Planned: LMA and Oral ETT  Additional Equipment:   Intra-op Plan:   Post-operative Plan: Extubation in OR  Informed Consent: I have reviewed the patients History and Physical, chart, labs and discussed the procedure including the risks, benefits and alternatives for the proposed anesthesia with the patient or authorized representative who has indicated his/her understanding and acceptance.     Dental advisory given  Plan Discussed with: CRNA, Surgeon and Anesthesiologist  Anesthesia Plan Comments: (  )        Anesthesia Quick Evaluation

## 2018-10-10 NOTE — Discharge Instructions (Signed)
1 - You may have urinary urgency (bladder spasms) and bloody urine on / off with stent in place. This is normal.  2 - Call MD or go to ER for fever >102, severe pain / nausea / vomiting not relieved by medications, or acute change in medical status  3 - Remove stents on the morning of Friday, May 22, by pulling on strings. Start antibiotic on Thursday May 21 and complete 3 day course.

## 2018-10-10 NOTE — Op Note (Signed)
Anthony Knight, Anthony Knight MEDICAL RECORD [id number] ACCOUNT [id number] DATE OF BIRTH:1960/12/28 FACILITY: [facility] LOCATION: [facility] PHYSICIAN:Hady Niemczyk, MD  OPERATIVE REPORT  DATE OF PROCEDURE:  10/10/2018  PREOPERATIVE DIAGNOSIS:  Left proximal ureteral stone, small right renal stone.  PROCEDURE: 1.  Cystoscopy with bilateral retrograde pyelograms, interpretation. 2.  Insertion of bilateral ureteral stents 5 x 26 Polaris with tether. 3.  Left ureteroscopy with laser lithotripsy. 4.  Right diagnostic ureteroscopy.  ESTIMATED BLOOD LOSS:  Nil.  COMPLICATIONS:  None.  SPECIMENS:  Left ureteral stone fragments for analysis.  FINDINGS: 1.  Left proximal ureteral stone. 2.  Very small volume left intrarenal stone. 3.  Right papillary tip calcifications.  No intraluminal stone on the right. 4.  Complete resolution of all accessible stone fragments larger than one-third millimeter following which was laser dissection. 5.  Successful placement of bilateral ureteral stents proximal in the renal pelvis, distal end in urinary bladder.  INDICATIONS:  The patient is a very pleasant 58 year old gentleman with history of recurrent urolithiasis found on workup of severe colicky flank pain to have a left proximal ureteral stone at approximately 9 mm.  He was seen in the office earlier this  morning.  His colic was severe.  He had not eaten in over 12 hours.  Options were discussed for management including medical therapy versus shockwave lithotripsy versus ureteroscopy, and he wished to proceed with the latter with goal of confirming  bilateral stone free.  Informed consent was obtained and placed in medical record.  DESCRIPTION OF PROCEDURE:  Patient being Anthony Knight, procedure being bilateral ureteroscopic stimulation was confirmed.  Procedure timeout was performed.  Antibiotics administered.  General anesthesia induced.  The patient was placed into a low  lithotomy position, sterile field  was created, prepped and draped base of the penis, perineum and proximal thighs using iodine.  Cystourethroscopy was then performed using a 21-French rigid cystoscope with vessel lens.  Inspection in the anterior and  posterior urethra revealed only bilobar prostatic hypertrophy.  The left ureter was cannulated with [place] renal catheter and left retrograde pyelogram was obtained.  Left retrograde pyelogram revealed a single left ureter with single system left kidney.  There was a questionable filling defect in the UPJ consistent with known stone.  A 0.03 ZIPwire was advanced  into the lower pole and set aside on a safety wire.   Similarly, right pyelogram was obtained.  A right pyelogram demonstrated a single right ureter single system right kidney.  No filling defects or narrowing noted.  A separate ZIPwire was advanced to lower pole and set aside as a safety wire.  Next semirigid ureteroscopy was performed of the  distal 2/3 left ureter alongside a glide working wire.  No significant mucosal abnormalities were found.  This was also advanced to the level of the renal pelvis and a 12/14,  38 cm ureteral access sheath was carefully placed over the sensor working wire  to the level of the proximal left ureter, using continuous fluoroscopic guidance, taking exquisite care not to advance proximal to the previously visualized portions, and flexible digital ureteroscopy performed the proximal left ureter and systematic  inspection of the left kidney, including all calices x3 with a dual channel ureteroscope.  Inspection of the UPJ revealed some mucosal edema consistent with prior stone impaction site.  There was no stone at this location.  It had been retrograde  positioned into a mid pole calix.  There were no additional stones in the left kidney.  It  was grasped with 0-tip basket and repositioned to the upper pole calix.  It appeared to be much too large for simple basketing.  As such, holmium laser energy   applied, 70 setting of 0.2 joules and 50 Hz.  Using a dusting technique, approximately 50% of stone volume was ablated.  The remaining 50% was removed using a basketing technique.  The fragments were sent for composition analysis.  Following this,  excellent hemostasis was noted.  No evidence of renal perforation.  There was complete resolution of all accessible stone fragments larger than one-third millimeter.  The access sheath was removed under continuous vision.  No significant mucosal  abnormalities were found.  Next semi-rigid ureteroscopy was performed in the distal 2/3 the right ureter alongside a separate glide working wire.  No mucosal abnormalities were found.  This was then exchanged for the ureteral access sheath over the  right-sided sensor working wire to the proximal right ureter using continuous fluoroscopic guidance and flexible digital ureteroscopy performed in the proximal ureter and systematic inspection of the right kidney, including all calices.  There were very  scant papillary densities seen, but without obvious intraluminal stone whatsoever.  Careful  Inspection of the lower pole, as it appeared to be the site of prior possible stone, it was felt that his prior calcification on x-ray, likely represented some  submucosal stones which were not intraluminal nor accessible.  As such, the access sheath was removed on the right side using continuous fluoroscopic guidance.  There was significant mucosal membranes found.  Given the bilateral nature of the procedure,  it was felt that brief interval stenting with tethered stent would be warranted.  As such, a new 5 x 26 Polaris-type stent was placed remaining safety wire on the right side using fluoroscopic guidance.  Good proximal and distal plane were noted.   Similarly, a separate 5 x 26 Polaris-type stent was placed on the left side of the remaining safety wire.  Using fluoroscopic guidance, good proximal and distal plane were noted.   Tethers were left in place fashioned to the dorsum of the penis.   Procedure was terminated.  The patient tolerated the procedure well.  No immediate complications.  The patient went to [facility] in stable condition with plan for discharge home.  AN/NUANCE  D:10/10/2018 T:10/10/2018 JOB:[id number]

## 2018-10-10 NOTE — Brief Op Note (Signed)
10/10/2018  2:05 PM  PATIENT:  Loletta Specter  58 y.o. male  PRE-OPERATIVE DIAGNOSIS:  left ureteral stone and right renal stone  POST-OPERATIVE DIAGNOSIS:  left ureteral stone and right renal stone  PROCEDURE:  Procedure(s): CYSTOSCOPY WITH BILATERAL RETROGRADE PYELOGRAM/URETERAL BILATERAL STENT PLACEMENT AND LASER (Bilateral)  SURGEON:  Surgeon(s) and Role:    Alexis Frock, MD - Primary  PHYSICIAN ASSISTANT:   ASSISTANTS: none   ANESTHESIA:   general  EBL:  minimal   BLOOD ADMINISTERED:none  DRAINS: none   LOCAL MEDICATIONS USED:  NONE  SPECIMEN:  Source of Specimen:  Left ureteral stone fragments  DISPOSITION OF SPECIMEN:  [facility] for compositional analysis  COUNTS:  YES  TOURNIQUET:  * No tourniquets in log *  DICTATION: .Other Dictation: Dictation Number [id number]  PLAN OF CARE: Discharge to home after PACU  PATIENT DISPOSITION:  PACU - hemodynamically stable.   Delay start of Pharmacological VTE agent (>24hrs) due to surgical blood loss or risk of bleeding: yes

## 2018-10-10 NOTE — Anesthesia Postprocedure Evaluation (Signed)
Anesthesia Post Note  Patient: Anthony Knight  Procedure(s) Performed: CYSTOSCOPY WITH BILATERAL RETROGRADE PYELOGRAM/URETERAL BILATERAL STENT PLACEMENT AND LASER,RIGHT URETERAL LASER LITHOTRIPSY,LEFT DIAGNOSTIC RETROGRADE (Bilateral )     Patient location during evaluation: PACU Anesthesia Type: General Level of consciousness: awake and alert Pain management: pain level controlled Vital Signs Assessment: post-procedure vital signs reviewed and stable Respiratory status: spontaneous breathing, nonlabored ventilation, respiratory function stable and patient connected to nasal cannula oxygen Cardiovascular status: blood pressure returned to baseline and stable Postop Assessment: no apparent nausea or vomiting Anesthetic complications: no    Last Vitals:  Vitals:   10/10/18 1117 10/10/18 1415  BP: 129/76 123/76  Pulse: 71 84  Resp: 18 15  Temp: 36.6 C 36.6 C  SpO2: 97% 97%    Last Pain:  Vitals:   10/10/18 1415  TempSrc:   PainSc: 0-No pain                 Merril Isakson

## 2018-10-10 NOTE — Transfer of Care (Signed)
Immediate Anesthesia Transfer of Care Note  Patient: Anthony Knight  Procedure(s) Performed: CYSTOSCOPY WITH BILATERAL RETROGRADE PYELOGRAM/URETERAL BILATERAL STENT PLACEMENT AND LASER,RIGHT URETERAL LASER LITHOTRIPSY,LEFT DIAGNOSTIC RETROGRADE (Bilateral )  Patient Location: PACU  Anesthesia Type:General  Level of Consciousness: awake, alert , oriented and patient cooperative  Airway & Oxygen Therapy: Patient Spontanous Breathing and Patient connected to face mask oxygen  Post-op Assessment: Report given to RN, Post -op Vital signs reviewed and stable and Patient moving all extremities  Post vital signs: Reviewed and stable  Last Vitals:  Vitals Value Taken Time  BP 123/76 10/10/2018  2:16 PM  Temp    Pulse 83 10/10/2018  2:17 PM  Resp 15 10/10/2018  2:17 PM  SpO2 100 % 10/10/2018  2:17 PM  Vitals shown include unvalidated device data.  Last Pain:  Vitals:   10/10/18 1117  TempSrc: Oral         Complications: No apparent anesthesia complications

## 2018-10-10 NOTE — Anesthesia Procedure Notes (Signed)
Procedure Name: LMA Insertion Date/Time: 10/10/2018 12:09 PM Performed by: Victoriano Lain, CRNA Pre-anesthesia Checklist: Patient identified, Emergency Drugs available, Suction available, Patient being monitored and Timeout performed Patient Re-evaluated:Patient Re-evaluated prior to induction Oxygen Delivery Method: Circle system utilized Preoxygenation: Pre-oxygenation with 100% oxygen Induction Type: IV induction LMA: LMA with gastric port inserted LMA Size: 4.0 Number of attempts: 1 Placement Confirmation: ETT inserted through vocal cords under direct vision,  positive ETCO2 and breath sounds checked- equal and bilateral Tube secured with: Tape Dental Injury: Teeth and Oropharynx as per pre-operative assessment

## 2018-10-10 NOTE — H&P (Signed)
Anthony Knight is an 58 y.o. male.    Chief Complaint: Pre-OP BILATERAL Ureteroscopic Stone Manipulation  HPI:   1 - Recurrent Urolithiasis -  Pre 2020 - MET x 1, SWL then URS x1  09/2018 - Left 27m UPJ stone with mild hydro by ER CT. 400HU, SSD 12cm. Only additional stone punctate RLP.   2 - Prostate Screening - No FHX prostate cancer  09/2018 - PSA (pending) / DRE 80gm smooth   3 - Very Large Prostate - 1367mprostate by ellipsoid calculation on CT 09/2018.   4 - Medical Stone Disease -  Eval 2020: BMP, PTH, Urate - pending; Composition - pending; 24 Hr Urines - pending.   5 - Acute Renal Failure - Cr 1.9's with left ureteral sotne 09/2018, baselien <1. He is on ARB for HTN.   PMH sig for oligometastatic colon cancer (surgery, xeloda, follows Anthony Knight), HTN. No ischemic CV disesae / blood thinners. 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   Today "[name] seen to proceed with BILATERAL ureteroscopic stone manipulation with goal of stone free for left ureteral and Rt renal tones with refracotyr colic and acute renal failure. Last meal yesterday night. No fevers. UA without infectious parameters.    Past Medical History:  Diagnosis Date  . [facility]   [facility] cancer  . History of kidney stones    early 2018  . Hypertension   . [facility]    Past Surgical History:  Procedure Laterality Date  . COLONOSCOPY N/A 03/01/2018   Procedure: COLONOSCOPY;  Surgeon: Anthony RoupMD;  Location: [facility];  Service: General;  Laterality: N/A;  . LAPAROSCOPIC SIGMOID COLECTOMY N/A 05/26/2017   Procedure: LAPAROSCOPIC ASSISTED SIGMOID COLECTOMY;  Surgeon: Anthony KussmaulMD;  [facility] Service: General;  Laterality: N/A;  ERAS PATHWAY  . PORT-A-CATH REMOVAL N/A 03/01/2018   Procedure: REMOVAL PORT-A-CATH;  Surgeon: Anthony KussmaulMD;  Location: [facility];  Service: General;   Laterality: N/A;  . PORTACATH PLACEMENT Left 06/22/2017   Procedure: INSERTION PORT-A-CATH;  Surgeon: Anthony KussmaulMD;  [place] [facility] Service: General;  Laterality: Left;  . rectal tear     Per patient report  . SIGMOIDOSCOPY N/A 05/26/2017   Procedure: RIGID SIGMOIDOSCOPY;  Surgeon: Anthony KussmaulMD;  [place] [facility];  Laterality: N/A;  . URETERAL STENT PLACEMENT  2002   kidney stones removal as well    Family History  Problem Relation Age of Onset  . Cancer Father        prostate  . Cancer Maternal Grandfather        lung and colon  . Cancer Maternal Uncle        colon  . Cancer Maternal Uncle        spleen  . Cancer Maternal Uncle    Social History:  reports that he quit smoking about 33 years ago. He has never used smokeless tobacco. He reports current alcohol use. He reports that he does not use drugs.  Allergies: No Known Allergies  No medications prior to admission.    Results for orders placed or performed during the hospital encounter of 10/09/18 (from the past 48 hour(s))  Lipase, blood     Status: None   Collection Time: 10/09/18 10:59 AM  Result Value Ref Range  Lipase 32 11 - 51 U/L    Comment: Performed at [facility], [place] [address] [place], [place] [address]  Comprehensive metabolic panel     Status: Abnormal   Collection Time: 10/09/18 10:59 AM  Result Value Ref Range   Sodium 137 135 - 145 mmol/L   Potassium 3.7 3.5 - 5.1 mmol/L   Chloride 101 98 - 111 mmol/L   CO2 25 22 - 32 mmol/L   Glucose, Bld 123 (H) 70 - 99 mg/dL   BUN 20 6 - 20 mg/dL   Creatinine, Ser 1.94 (H) 0.61 - 1.24 mg/dL   Calcium 9.5 8.9 - 10.3 mg/dL   Total Protein 7.6 6.5 - 8.1 g/dL   Albumin 3.8 3.5 - 5.0 g/dL   AST 16 15 - 41 U/L   ALT 20 0 - 44 U/L   Alkaline Phosphatase 72 38 - 126 U/L   Total Bilirubin 1.1 0.3 - 1.2 mg/dL   GFR calc non Af Amer 37 (L) >60 mL/min   GFR calc Af Amer 43 (L) >60 mL/min   Anion gap 11 5 - 15     Comment: Performed at [facility] [address] [place], [place] [address]  CBC     Status: Abnormal   Collection Time: 10/09/18 10:59 AM  Result Value Ref Range   WBC 13.5 (H) 4.0 - 10.5 K/uL   RBC 5.14 4.22 - 5.81 MIL/uL   Hemoglobin 15.2 13.0 - 17.0 g/dL   HCT 44.4 39.0 - 52.0 %   MCV 86.4 80.0 - 100.0 fL   MCH 29.6 26.0 - 34.0 pg   MCHC 34.2 30.0 - 36.0 g/dL   RDW 13.1 11.5 - 15.5 %   Platelets 294 150 - 400 K/uL   nRBC 0.0 0.0 - 0.2 %    Comment: Performed at [facility], [place] [address], [place], [place] [address]  Urinalysis, Routine w reflex microscopic     Status: Abnormal   Collection Time: 10/09/18  1:27 PM  Result Value Ref Range   Color, Urine YELLOW YELLOW   APPearance CLEAR CLEAR   Specific Gravity, Urine 1.024 1.005 - 1.030   pH 5.0 5.0 - 8.0   Glucose, UA NEGATIVE NEGATIVE mg/dL   Hgb urine dipstick MODERATE (A) NEGATIVE   Bilirubin Urine NEGATIVE NEGATIVE   Ketones, ur NEGATIVE NEGATIVE mg/dL   Protein, ur NEGATIVE NEGATIVE mg/dL   Nitrite NEGATIVE NEGATIVE   Leukocytes,Ua NEGATIVE NEGATIVE   RBC / HPF 0-5 0 - 5 RBC/hpf   WBC, UA 0-5 0 - 5 WBC/hpf   Bacteria, UA NONE SEEN NONE SEEN   Squamous Epithelial / LPF 0-5 0 - 5   Mucus PRESENT     Comment: Performed at [facility], [place] [address] [place], [place] [address]   Ct Renal Stone Study  Result Date: 10/09/2018 CLINICAL DATA:  Left flank pain today EXAM: CT ABDOMEN AND PELVIS WITHOUT CONTRAST TECHNIQUE: Multidetector CT imaging of the abdomen and pelvis was performed following the standard protocol without IV contrast. COMPARISON:  05/21/2018 FINDINGS: Lower chest: Dependent atelectasis. Hepatobiliary: Diffuse hepatic steatosis with relative sparing next to the gallbladder. Unremarkable gallbladder. Pancreas: Unremarkable Spleen: Unremarkable Adrenals/Urinary Tract: Mild left hydronephrosis and perinephric stranding. 6 mm calculus at the left ureteropelvic junction. There is  stranding along the course of the left ureter. Small punctate calculus in the lower pole of the right kidney unremarkable adrenal glands. Bladder is decompressed. Stomach/Bowel: Postoperative changes from  sigmoid resection. No obvious mass in the colon. Normal appendix. No evidence of small-bowel distention. Unremarkable stomach. Vascular/Lymphatic: Minimal atherosclerotic vascular calcifications. No abnormal retroperitoneal adenopathy. Reproductive: Prostate is enlarged and lobulated. Other: No free-fluid.  Left inguinal hernia contains adipose tissue. Musculoskeletal: No vertebral compression deformity. IMPRESSION: 6 mm left UPJ calculus is associated with secondary findings of left ureteral obstruction. Right nephrolithiasis. Prostate enlargement.  Correlate with physical exam and PSA. Electronically Signed   By: Marybelle Killings M.D.   On: 10/09/2018 12:48    Review of Systems  Constitutional: Positive for malaise/fatigue. Negative for chills and fever.  HENT: Negative.   Eyes: Negative.   Respiratory: Negative.   Cardiovascular: Negative.   Gastrointestinal: Positive for nausea and vomiting.  Genitourinary: Positive for flank pain. Negative for dysuria.  Skin: Negative.   Neurological: Negative.   Endo/Heme/Allergies: Negative.   Psychiatric/Behavioral: Negative.     There were no vitals taken for this visit. Physical Exam  Constitutional: He appears well-developed.  HENT:  Head: Normocephalic.  Neck: Normal range of motion.  Respiratory: Effort normal.  GI: Soft.  Genitourinary:    Genitourinary Comments: Mild left CVAT at present.    Neurological: He is alert.  Skin: Skin is warm.  Psychiatric: He has a normal mood and affect.     Plan  Proceed as planned with BILATERAL ureteroscopy. Risks, benefits, alternatives, expected peri-op course including need for at least temporary stents since bilateral procedure discussed.   Alexis Frock, MD 10/10/2018, 10:49 AM

## 2018-10-11 ENCOUNTER — Encounter (HOSPITAL_COMMUNITY): Payer: Self-pay | Admitting: Urology

## 2018-10-19 DIAGNOSIS — I10 Essential (primary) hypertension: Secondary | ICD-10-CM | POA: Diagnosis not present

## 2018-11-13 ENCOUNTER — Telehealth: Payer: Self-pay | Admitting: *Deleted

## 2018-11-13 NOTE — Telephone Encounter (Signed)
Called to ask when is his next CT scan, thinking MD said every 6 months. Per Dr. Benay Spice: Last CT 04/2018 was good-will not scan until 1 year from last. Patient notified and was told he will have a CEA level check on 6/30 and see MD>

## 2018-11-20 ENCOUNTER — Inpatient Hospital Stay (HOSPITAL_BASED_OUTPATIENT_CLINIC_OR_DEPARTMENT_OTHER): Payer: BC Managed Care – PPO | Admitting: Oncology

## 2018-11-20 ENCOUNTER — Telehealth: Payer: Self-pay | Admitting: Oncology

## 2018-11-20 ENCOUNTER — Other Ambulatory Visit: Payer: Self-pay

## 2018-11-20 ENCOUNTER — Inpatient Hospital Stay: Payer: BC Managed Care – PPO | Attending: Oncology

## 2018-11-20 VITALS — BP 113/80 | HR 72 | Temp 99.1°F | Resp 18 | Ht 72.0 in | Wt 217.7 lb

## 2018-11-20 DIAGNOSIS — N289 Disorder of kidney and ureter, unspecified: Secondary | ICD-10-CM | POA: Insufficient documentation

## 2018-11-20 DIAGNOSIS — I10 Essential (primary) hypertension: Secondary | ICD-10-CM | POA: Insufficient documentation

## 2018-11-20 DIAGNOSIS — Z85038 Personal history of other malignant neoplasm of large intestine: Secondary | ICD-10-CM | POA: Insufficient documentation

## 2018-11-20 DIAGNOSIS — Z9221 Personal history of antineoplastic chemotherapy: Secondary | ICD-10-CM | POA: Diagnosis not present

## 2018-11-20 DIAGNOSIS — Z87442 Personal history of urinary calculi: Secondary | ICD-10-CM | POA: Diagnosis not present

## 2018-11-20 DIAGNOSIS — Z800 Family history of malignant neoplasm of digestive organs: Secondary | ICD-10-CM | POA: Insufficient documentation

## 2018-11-20 DIAGNOSIS — C187 Malignant neoplasm of sigmoid colon: Secondary | ICD-10-CM

## 2018-11-20 LAB — BASIC METABOLIC PANEL - CANCER CENTER ONLY
Anion gap: 10 (ref 5–15)
BUN: 21 mg/dL — ABNORMAL HIGH (ref 6–20)
CO2: 25 mmol/L (ref 22–32)
Calcium: 9.4 mg/dL (ref 8.9–10.3)
Chloride: 104 mmol/L (ref 98–111)
Creatinine: 1.3 mg/dL — ABNORMAL HIGH (ref 0.61–1.24)
GFR, Est AFR Am: >60 mL/min (ref >60)
GFR, Estimated: >60 mL/min (ref >60)
Glucose, Bld: 89 mg/dL (ref 70–99)
Potassium: 3.7 mmol/L (ref 3.5–5.1)
Sodium: 139 mmol/L (ref 135–145)

## 2018-11-20 LAB — CEA (IN HOUSE-CHCC): CEA (CHCC-In House): 2.23 ng/mL (ref 0.00–5.00)

## 2018-11-20 NOTE — Progress Notes (Signed)
  Anthony Knight   Diagnosis: Colon cancer  INTERVAL HISTORY:   Anthony Knight returns as scheduled he feels well.  Good appetite and energy level.  Objective:  Vital signs in last 24 hours:  Blood pressure 113/80, pulse 72, temperature 99.1 F (37.3 C), temperature source Oral, resp. rate 18, height 6' (1.829 m), weight 217 lb 11.2 oz (98.7 kg), SpO2 98 %.    Limited physical examination secondary to distancing with the COVID pandemic Lymphatics: No cervical, supraclavicular, axillary, or inguinal nodes GI: No hepatosplenomegaly, no mass, nontender Vascular: No leg edema   Lab Results:  Lab Results  Component Value Date   WBC 13.5 (H) 10/09/2018   HGB 15.2 10/09/2018   HCT 44.4 10/09/2018   MCV 86.4 10/09/2018   PLT 294 10/09/2018   NEUTROABS 5.1 01/23/2018    CMP  Lab Results  Component Value Date   NA 139 11/20/2018   K 3.7 11/20/2018   CL 104 11/20/2018   CO2 25 11/20/2018   GLUCOSE 89 11/20/2018   BUN 21 (H) 11/20/2018   CREATININE 1.30 (H) 11/20/2018   CALCIUM 9.4 11/20/2018   PROT 7.6 10/09/2018   ALBUMIN 3.8 10/09/2018   AST 16 10/09/2018   ALT 20 10/09/2018   ALKPHOS 72 10/09/2018   BILITOT 1.1 10/09/2018   GFRNONAA >60 11/20/2018   GFRAA >60 11/20/2018    Lab Results  Component Value Date   CEA1 2.23 11/20/2018     Medications: I have reviewed the patient's current medications.   Assessment/Plan: 1. Adenocarcinoma of the sigmoid colon, synchronous primary tumors separated by 2 cm, status post a sigmoid colectomy 05/26/2017 ? Stage III (T3N1) moderately differentiated adenocarcinoma, MSI-stable, no loss of mismatch repair protein expression, lymphovascular and perineural invasion noted ? Elevated preoperative CEA ? CT abdomen/pelvis 05/05/2018-wall thickening of the sigmoid colon, mesenteric nodes at the left common iliac, sigmoid diverticulosis ? CT chest 06/26/2017-negative for metastatic disease ? Cycle 1 CAPOX  06/29/2017 ? Cycle 2 CAPOX 07/20/2017 ? Cycle of Xeloda 08/17/2017 ? Cycle of Xeloda 09/09/2017 ? Cycle of Xeloda 09/30/2017 ? Cycle of Xeloda 11/06/2017 ? Cycle of Xeloda 11/27/2017  ? Cycle of Xeldoa 12/18/2017  ? CTs 05/21/2018- no evidence of recurrent colon cancer 2. Hypertension 3. Family history of colon cancer 4. High rectal polyp noted at rigid sigmoidoscopy during surgery 05/26/2017, no preoperative colonoscopy was performed  Colonoscopy 03/01/2018- hyperplastic polyp removed from the rectum 5. History of kidney stones 6. Mild renal insufficiency    Disposition: Anthony Knight is in clinical remission from colon cancer.  He will return for an office visit and restaging CTs in 6 months.  Betsy Coder, MD  11/20/2018  12:18 PM

## 2018-11-20 NOTE — Telephone Encounter (Signed)
Gave avs and calendar ° °

## 2018-11-22 ENCOUNTER — Telehealth: Payer: Self-pay | Admitting: *Deleted

## 2018-11-22 NOTE — Telephone Encounter (Signed)
Notified of CEA results and MD want restaging scans a couple days before his December visit. He will call office if has not heard from radiology by 1st week in December. Instructed him he will need to stop by one day to pick up contrast.

## 2018-11-22 NOTE — Telephone Encounter (Signed)
-----   Message from Ladell Pier, MD sent at 11/20/2018  4:15 PM EDT ----- Please call patient, CEA is normal, please let him know I ordered restaging CTs and lab for 1 to 2 days prior to the next visit, I ordered the lab and CTs after he left the office today-does not look like schedulers acted on the POF before I put the order in

## 2018-12-27 DIAGNOSIS — T1502XA Foreign body in cornea, left eye, initial encounter: Secondary | ICD-10-CM | POA: Diagnosis not present

## 2018-12-31 DIAGNOSIS — T1502XD Foreign body in cornea, left eye, subsequent encounter: Secondary | ICD-10-CM | POA: Diagnosis not present

## 2019-05-13 ENCOUNTER — Telehealth: Payer: Self-pay | Admitting: *Deleted

## 2019-05-13 ENCOUNTER — Inpatient Hospital Stay: Payer: BC Managed Care – PPO | Attending: Oncology | Admitting: Oncology

## 2019-05-13 ENCOUNTER — Inpatient Hospital Stay: Payer: BC Managed Care – PPO

## 2019-05-13 ENCOUNTER — Encounter (HOSPITAL_COMMUNITY): Payer: Self-pay

## 2019-05-13 ENCOUNTER — Other Ambulatory Visit: Payer: Self-pay

## 2019-05-13 ENCOUNTER — Ambulatory Visit (HOSPITAL_COMMUNITY)
Admission: RE | Admit: 2019-05-13 | Discharge: 2019-05-13 | Disposition: A | Discharge status: Home / Self Care | Payer: BC Managed Care – PPO | Source: Ambulatory Visit | Attending: Oncology | Admitting: Oncology

## 2019-05-13 VITALS — BP 113/79 | HR 75 | Temp 97.9°F | Resp 16 | Ht 72.0 in | Wt 221.0 lb

## 2019-05-13 DIAGNOSIS — I10 Essential (primary) hypertension: Secondary | ICD-10-CM | POA: Diagnosis not present

## 2019-05-13 DIAGNOSIS — N289 Disorder of kidney and ureter, unspecified: Secondary | ICD-10-CM | POA: Diagnosis not present

## 2019-05-13 DIAGNOSIS — C187 Malignant neoplasm of sigmoid colon: Secondary | ICD-10-CM | POA: Insufficient documentation

## 2019-05-13 DIAGNOSIS — Z9221 Personal history of antineoplastic chemotherapy: Secondary | ICD-10-CM | POA: Diagnosis not present

## 2019-05-13 DIAGNOSIS — Z79899 Other long term (current) drug therapy: Secondary | ICD-10-CM | POA: Insufficient documentation

## 2019-05-13 DIAGNOSIS — Z800 Family history of malignant neoplasm of digestive organs: Secondary | ICD-10-CM | POA: Insufficient documentation

## 2019-05-13 DIAGNOSIS — Z87442 Personal history of urinary calculi: Secondary | ICD-10-CM | POA: Diagnosis not present

## 2019-05-13 DIAGNOSIS — Z9049 Acquired absence of other specified parts of digestive tract: Secondary | ICD-10-CM | POA: Insufficient documentation

## 2019-05-13 DIAGNOSIS — R3915 Urgency of urination: Secondary | ICD-10-CM | POA: Insufficient documentation

## 2019-05-13 LAB — CMP (CANCER CENTER ONLY)
ALT: 37 U/L (ref 0–44)
AST: 22 U/L (ref 15–41)
Albumin: 4.2 g/dL (ref 3.5–5.0)
Alkaline Phosphatase: 65 U/L (ref 38–126)
Anion gap: 13 (ref 5–15)
BUN: 17 mg/dL (ref 6–20)
CO2: 27 mmol/L (ref 22–32)
Calcium: 9.6 mg/dL (ref 8.9–10.3)
Chloride: 101 mmol/L (ref 98–111)
Creatinine: 1.34 mg/dL — ABNORMAL HIGH (ref 0.61–1.24)
GFR, Est AFR Am: >60 mL/min (ref >60)
GFR, Estimated: 58 mL/min — ABNORMAL LOW (ref >60)
Glucose, Bld: 119 mg/dL — ABNORMAL HIGH (ref 70–99)
Potassium: 3.8 mmol/L (ref 3.5–5.1)
Sodium: 141 mmol/L (ref 135–145)
Total Bilirubin: 0.5 mg/dL (ref 0.3–1.2)
Total Protein: 7.5 g/dL (ref 6.5–8.1)

## 2019-05-13 LAB — CEA (IN HOUSE-CHCC): CEA (CHCC-In House): 1.51 ng/mL (ref 0.00–5.00)

## 2019-05-13 MED ORDER — SODIUM CHLORIDE (PF) 0.9 % IJ SOLN
INTRAMUSCULAR | Status: AC
  Filled 2019-05-13: qty 50

## 2019-05-13 MED ORDER — IOHEXOL 300 MG/ML  SOLN
100.0000 mL | Freq: Once | INTRAMUSCULAR | Status: AC | PRN
  Administered 2019-05-13: 11:00:00 100 mL via INTRAVENOUS

## 2019-05-13 NOTE — Telephone Encounter (Signed)
Notified of CT scan with no evidence of cancer. Informed of the enlarged prostate and to f/u with his PCP or urology for management of this. He agrees.

## 2019-05-13 NOTE — Telephone Encounter (Signed)
-----   Message from Ladell Pier, MD sent at 05/13/2019  1:39 PM EST ----- Please call patient, CTs show no evidence of cancer, prostate is enlarged- likely explains urinary symptoms and may explain abnormal kidney function Need to f/u with a primary MD or urologist  In [place] report says "hydronephrosis", think this is a typo and should read no hydonephrosis, please ask Dr. [place] Pink to correct

## 2019-05-13 NOTE — Progress Notes (Signed)
  [facility] OFFICE PROGRESS NOTE   Diagnosis: Colon cancer  INTERVAL HISTORY:   Anthony Knight returns as scheduled.  He feels well.  Good appetite and energy level.  No neuropathy symptoms.  He has noted urinary urgency since completing chemotherapy.  Objective:  Vital signs in last 24 hours:  Blood pressure 113/79, pulse 75, temperature 97.9 F (36.6 C), temperature source Temporal, resp. rate 16, height 6' (1.829 m), weight 221 lb (100.2 kg), SpO2 100 %.    Limited physical examination secondary to distancing with the Covid pandemic Lymphatics: No cervical, supraclavicular, axillary, or inguinal nodes GI: Nontender, no mass, no hepatosplenomegaly Vascular: No leg edema  Lab Results:  Lab Results  Component Value Date   WBC 13.5 (H) 10/09/2018   HGB 15.2 10/09/2018   HCT 44.4 10/09/2018   MCV 86.4 10/09/2018   PLT 294 10/09/2018   NEUTROABS 5.1 01/23/2018    CMP  Lab Results  Component Value Date   NA 141 05/13/2019   K 3.8 05/13/2019   CL 101 05/13/2019   CO2 27 05/13/2019   GLUCOSE 119 (H) 05/13/2019   BUN 17 05/13/2019   CREATININE 1.34 (H) 05/13/2019   CALCIUM 9.6 05/13/2019   PROT 7.5 05/13/2019   ALBUMIN 4.2 05/13/2019   AST 22 05/13/2019   ALT 37 05/13/2019   ALKPHOS 65 05/13/2019   BILITOT 0.5 05/13/2019   GFRNONAA 58 (L) 05/13/2019   GFRAA >60 05/13/2019    Lab Results  Component Value Date   CEA1 1.51 05/13/2019   Medications: I have reviewed the patient's current medications.   Assessment/Plan: 1. Adenocarcinoma of the sigmoid colon, synchronous primary tumors separated by 2 cm, status post a sigmoid colectomy 05/26/2017 ? Stage III (T3N1) moderately differentiated adenocarcinoma, MSI-stable, no loss of mismatch repair protein expression, lymphovascular and perineural invasion noted ? Elevated preoperative CEA ? CT abdomen/pelvis 05/05/2018-wall thickening of the sigmoid colon, mesenteric nodes at the left common iliac, sigmoid  diverticulosis ? CT chest 06/26/2017-negative for metastatic disease ? Cycle 1 CAPOX 06/29/2017 ? Cycle 2 CAPOX 07/20/2017 ? Cycle of Xeloda 08/17/2017 ? Cycle of Xeloda 09/09/2017 ? Cycle of Xeloda 09/30/2017 ? Cycle of Xeloda 11/06/2017 ? Cycle of Xeloda 11/27/2017  ? Cycle of Xeldoa 12/18/2017  ? CTs 05/21/2018- no evidence of recurrent colon cancer 2. Hypertension 3. Family history of colon cancer 4. High rectal polyp noted at rigid sigmoidoscopy during surgery 05/26/2017, no preoperative colonoscopy was performed  Colonoscopy 03/01/2018- hyperplastic polyp removed from the rectum 5. History of kidney stones 6. Mild renal insufficiency  Disposition: Anthony Knight is in clinical remission from colon cancer.  We will follow-up on results from restaging CTs performed earlier today.  He will return for an office visit and CEA in 6 months.  He will follow-up with his cardiologist and primary provider for evaluation of renal insufficiency and urinary urgency.  Betsy Coder, MD  05/13/2019  1:00 PM

## 2019-05-14 ENCOUNTER — Telehealth: Payer: Self-pay | Admitting: Oncology

## 2019-05-14 NOTE — Telephone Encounter (Signed)
Scheduled per los. Called and spoke with patient. Confirmed appt 

## 2019-11-07 ENCOUNTER — Ambulatory Visit: Payer: BC Managed Care – PPO | Attending: Internal Medicine

## 2019-11-07 DIAGNOSIS — Z23 Encounter for immunization: Secondary | ICD-10-CM

## 2019-11-07 NOTE — Progress Notes (Signed)
   Covid-19 Vaccination Clinic  Name:  Anthony Knight    MRN: [id number] DOB: August 22, 1960  11/07/2019  [name] was observed post Covid-19 immunization for 15 minutes without incident. He was provided with Vaccine Information Sheet and instruction to access the V-Safe system.   [name] was instructed to call 911 with any severe reactions post vaccine: Marland Kitchen Difficulty breathing  . Swelling of face and throat  . A fast heartbeat  . A bad rash all over body  . Dizziness and weakness   Immunizations Administered    Name Date Dose VIS Date Route   JANSSEN COVID-19 VACCINE 11/07/2019 11:04 AM 0.5 mL 07/20/2019 Intramuscular   Manufacturer: [employer]   Lot: [id number]   [place]: [id number]

## 2019-11-11 ENCOUNTER — Inpatient Hospital Stay: Payer: BC Managed Care – PPO | Admitting: Nurse Practitioner

## 2019-11-11 ENCOUNTER — Inpatient Hospital Stay: Payer: BC Managed Care – PPO

## 2019-11-19 ENCOUNTER — Inpatient Hospital Stay: Payer: BC Managed Care – PPO | Attending: Nurse Practitioner

## 2019-11-19 ENCOUNTER — Other Ambulatory Visit: Payer: Self-pay

## 2019-11-19 ENCOUNTER — Inpatient Hospital Stay: Payer: BC Managed Care – PPO | Admitting: Nurse Practitioner

## 2019-11-19 ENCOUNTER — Telehealth: Payer: Self-pay | Admitting: Oncology

## 2019-11-19 ENCOUNTER — Encounter: Payer: Self-pay | Admitting: Nurse Practitioner

## 2019-11-19 ENCOUNTER — Telehealth: Payer: Self-pay | Admitting: Emergency Medicine

## 2019-11-19 VITALS — BP 140/89 | HR 75 | Temp 97.7°F | Resp 20 | Ht 72.0 in | Wt 226.2 lb

## 2019-11-19 DIAGNOSIS — N289 Disorder of kidney and ureter, unspecified: Secondary | ICD-10-CM | POA: Diagnosis not present

## 2019-11-19 DIAGNOSIS — C187 Malignant neoplasm of sigmoid colon: Secondary | ICD-10-CM

## 2019-11-19 DIAGNOSIS — C772 Secondary and unspecified malignant neoplasm of intra-abdominal lymph nodes: Secondary | ICD-10-CM | POA: Diagnosis not present

## 2019-11-19 DIAGNOSIS — Z800 Family history of malignant neoplasm of digestive organs: Secondary | ICD-10-CM | POA: Diagnosis not present

## 2019-11-19 DIAGNOSIS — I10 Essential (primary) hypertension: Secondary | ICD-10-CM | POA: Diagnosis not present

## 2019-11-19 DIAGNOSIS — Z87442 Personal history of urinary calculi: Secondary | ICD-10-CM | POA: Insufficient documentation

## 2019-11-19 LAB — BASIC METABOLIC PANEL - CANCER CENTER ONLY
Anion gap: 11 (ref 5–15)
BUN: 20 mg/dL (ref 6–20)
CO2: 22 mmol/L (ref 22–32)
Calcium: 9.6 mg/dL (ref 8.9–10.3)
Chloride: 105 mmol/L (ref 98–111)
Creatinine: 1.33 mg/dL — ABNORMAL HIGH (ref 0.61–1.24)
GFR, Est AFR Am: >60 mL/min (ref >60)
GFR, Estimated: 59 mL/min — ABNORMAL LOW (ref >60)
Glucose, Bld: 175 mg/dL — ABNORMAL HIGH (ref 70–99)
Potassium: 3.7 mmol/L (ref 3.5–5.1)
Sodium: 138 mmol/L (ref 135–145)

## 2019-11-19 LAB — CEA (IN HOUSE-CHCC): CEA (CHCC-In House): 1.77 ng/mL (ref 0.00–5.00)

## 2019-11-19 NOTE — Progress Notes (Signed)
  [facility] OFFICE PROGRESS NOTE   Diagnosis: Colon cancer  INTERVAL HISTORY:   Anthony Knight returns as scheduled.  He feels well.  No change in bowel habits.  No blood with bowel movements.  He denies abdominal pain.  He has a good appetite.  Objective:  Vital signs in last 24 hours:  Blood pressure 140/89, pulse 75, temperature 97.7 F (36.5 C), temperature source Temporal, resp. rate 20, height 6' (1.829 m), weight 226 lb 3.2 oz (102.6 kg), SpO2 98 %.    HEENT: Neck without mass. Lymphatics: No palpable cervical, supraclavicular, axillary or inguinal lymph nodes. Resp: Lungs clear bilaterally. Cardio: Regular rate and rhythm. GI: Abdomen soft and nontender.  No hepatomegaly. Vascular: No leg edema.   Lab Results:  Lab Results  Component Value Date   WBC 13.5 (H) 10/09/2018   HGB 15.2 10/09/2018   HCT 44.4 10/09/2018   MCV 86.4 10/09/2018   PLT 294 10/09/2018   NEUTROABS 5.1 01/23/2018    Imaging:  No results found.  Medications: I have reviewed the patient's current medications.  Assessment/Plan: 1. Adenocarcinoma of the sigmoid colon, synchronous primary tumors separated by 2 cm, status post a sigmoid colectomy 05/26/2017 ? Stage III (T3N1) moderately differentiated adenocarcinoma, MSI-stable, no loss of mismatch repair protein expression, lymphovascular and perineural invasion noted ? Elevated preoperative CEA ? CT abdomen/pelvis 05/05/2018-wall thickening of the sigmoid colon, mesenteric nodes at the left common iliac, sigmoid diverticulosis ? CT chest 06/26/2017-negative for metastatic disease ? Cycle 1 CAPOX 06/29/2017 ? Cycle 2 CAPOX 07/20/2017 ? Cycle of Xeloda 08/17/2017 ? Cycle of Xeloda 09/09/2017 ? Cycle of Xeloda 09/30/2017 ? Cycle of Xeloda 11/06/2017 ? Cycle of Xeloda 11/27/2017 ? Cycle of Xeldoa 12/18/2017 ? CTs 05/21/2018- no evidence of recurrent colon cancer ? CTs 05/13/2019-no evidence of recurrent or metastatic  disease 2. Hypertension 3. Family history of colon cancer 4. High rectal polyp noted at rigid sigmoidoscopy during surgery 05/26/2017, no preoperative colonoscopy was performed  Colonoscopy 03/01/2018- hyperplastic polyp removed from the rectum 5. History of kidney stones 6. Mild renal insufficiency   Disposition: Anthony Knight remains in clinical remission from colon cancer.  We will follow-up on the CEA from today.  Plan for CT scans in 6 months.  He will return for a follow-up visit a few days after the CT scans to review the results.  He will contact the office in the interim with any problems.    Ned Card ANP/GNP-BC   11/19/2019  10:54 AM

## 2019-11-19 NOTE — Telephone Encounter (Signed)
Pt BG 175 in office today along with elevated creatine. Per Ned Card, NP- please fax results to pts primary doctor and have pt follow up with MD.  This pt states he does not have a pcp at this time. I educated him on the need to find one asap to address these issues. He states he will call and get an appt somewhere next week and call us back with the information to fax these labs too.   Also informed pt of his stable CEA levels.

## 2019-11-19 NOTE — Telephone Encounter (Signed)
Scheduled per 06/29 los, patient received updated calender.

## 2020-05-19 ENCOUNTER — Other Ambulatory Visit: Payer: BC Managed Care – PPO

## 2020-05-20 ENCOUNTER — Other Ambulatory Visit: Payer: BC Managed Care – PPO

## 2020-05-20 ENCOUNTER — Ambulatory Visit (HOSPITAL_COMMUNITY): Payer: BC Managed Care – PPO

## 2020-05-21 ENCOUNTER — Telehealth: Payer: Self-pay | Admitting: *Deleted

## 2020-05-21 ENCOUNTER — Ambulatory Visit: Payer: BC Managed Care – PPO | Admitting: Oncology

## 2020-05-21 NOTE — Telephone Encounter (Signed)
CT scan due in December was never scheduled and PA has expired. Left patient VM asking if he wants to come in today as scheduled and do CT later w/results called to him or reschedule Lab/OV to after CT is scheduled. Notified managed care to work on PA again.

## 2020-06-03 ENCOUNTER — Telehealth: Payer: Self-pay | Admitting: *Deleted

## 2020-06-03 NOTE — Telephone Encounter (Signed)
Called to f/u on cancellation of CT scan and his "no show" for Dr. Benay Spice. He agrees to proceed with all the appointments now--wanted to wait till January 2022. Confirmed his insurance has not changed.

## 2020-06-04 ENCOUNTER — Telehealth: Payer: Self-pay

## 2020-06-04 NOTE — Telephone Encounter (Signed)
LM for pt to call back to [facility] for CT information. Central scheduling was contacted prior to make appointment for 06/23/20 for CT scan of abd and pelvis. Labs to be done prior.

## 2020-06-05 ENCOUNTER — Telehealth: Payer: Self-pay | Admitting: *Deleted

## 2020-06-05 NOTE — Telephone Encounter (Signed)
Called patient to review date/time of CT scan and prep. He was in the field working and could not take the information. Will call him again next week. Scheduling message sent for his lab and OV.

## 2020-06-08 ENCOUNTER — Telehealth: Payer: Self-pay | Admitting: Oncology

## 2020-06-08 NOTE — Telephone Encounter (Signed)
Scheduled appt per 11/3 sch msg - left message for patient with appt date and time  ° °

## 2020-06-16 ENCOUNTER — Telehealth: Payer: Self-pay | Admitting: *Deleted

## 2020-06-16 NOTE — Telephone Encounter (Signed)
Called patient w/date and time of CT scan and MD follow up. He will pick up the contrast in [place] and knows to drink at 0700 and 0800 and NPO 4 hours prior to scan.

## 2020-06-23 ENCOUNTER — Other Ambulatory Visit: Payer: Self-pay

## 2020-06-23 ENCOUNTER — Inpatient Hospital Stay: Payer: BC Managed Care – PPO | Attending: Oncology

## 2020-06-23 ENCOUNTER — Ambulatory Visit (HOSPITAL_COMMUNITY)
Admission: RE | Admit: 2020-06-23 | Discharge: 2020-06-23 | Disposition: A | Discharge status: Home / Self Care | Payer: BC Managed Care – PPO | Source: Ambulatory Visit | Attending: Nurse Practitioner | Admitting: Nurse Practitioner

## 2020-06-23 DIAGNOSIS — N289 Disorder of kidney and ureter, unspecified: Secondary | ICD-10-CM | POA: Diagnosis not present

## 2020-06-23 DIAGNOSIS — C187 Malignant neoplasm of sigmoid colon: Secondary | ICD-10-CM | POA: Insufficient documentation

## 2020-06-23 DIAGNOSIS — N401 Enlarged prostate with lower urinary tract symptoms: Secondary | ICD-10-CM | POA: Insufficient documentation

## 2020-06-23 DIAGNOSIS — Z800 Family history of malignant neoplasm of digestive organs: Secondary | ICD-10-CM | POA: Insufficient documentation

## 2020-06-23 DIAGNOSIS — I10 Essential (primary) hypertension: Secondary | ICD-10-CM | POA: Insufficient documentation

## 2020-06-23 DIAGNOSIS — Z87442 Personal history of urinary calculi: Secondary | ICD-10-CM | POA: Diagnosis not present

## 2020-06-23 LAB — BASIC METABOLIC PANEL - CANCER CENTER ONLY
Anion gap: 10 (ref 5–15)
BUN: 15 mg/dL (ref 6–20)
CO2: 27 mmol/L (ref 22–32)
Calcium: 9.8 mg/dL (ref 8.9–10.3)
Chloride: 104 mmol/L (ref 98–111)
Creatinine: 1.36 mg/dL — ABNORMAL HIGH (ref 0.61–1.24)
GFR, Estimated: 60 mL/min — ABNORMAL LOW (ref >60)
Glucose, Bld: 123 mg/dL — ABNORMAL HIGH (ref 70–99)
Potassium: 4.1 mmol/L (ref 3.5–5.1)
Sodium: 141 mmol/L (ref 135–145)

## 2020-06-23 LAB — CEA (IN HOUSE-CHCC): CEA (CHCC-In House): 1.46 ng/mL (ref 0.00–5.00)

## 2020-06-23 MED ORDER — IOHEXOL 300 MG/ML  SOLN
100.0000 mL | Freq: Once | INTRAMUSCULAR | Status: AC | PRN
  Administered 2020-06-23: 100 mL via INTRAVENOUS

## 2020-06-29 ENCOUNTER — Inpatient Hospital Stay: Payer: BC Managed Care – PPO | Admitting: Oncology

## 2020-06-29 ENCOUNTER — Inpatient Hospital Stay (HOSPITAL_BASED_OUTPATIENT_CLINIC_OR_DEPARTMENT_OTHER): Payer: BC Managed Care – PPO

## 2020-06-29 ENCOUNTER — Other Ambulatory Visit: Payer: Self-pay

## 2020-06-29 ENCOUNTER — Telehealth: Payer: Self-pay | Admitting: Oncology

## 2020-06-29 VITALS — BP 144/87 | HR 86 | Temp 97.0°F | Resp 16 | Ht 72.0 in | Wt 231.0 lb

## 2020-06-29 DIAGNOSIS — C187 Malignant neoplasm of sigmoid colon: Secondary | ICD-10-CM

## 2020-06-29 DIAGNOSIS — Z23 Encounter for immunization: Secondary | ICD-10-CM | POA: Diagnosis not present

## 2020-06-29 NOTE — Telephone Encounter (Signed)
Scheduled appointment per 2/7 los. Spoke to patient who is aware to appointment date and time.

## 2020-06-29 NOTE — Progress Notes (Signed)
[facility] OFFICE PROGRESS NOTE   Diagnosis: Colon cancer  INTERVAL HISTORY:   Mr. Sangalang returns as scheduled.  He feels well.  He reports discomfort at the right heel when he first gets up from a sitting position.  This lasts for a few minutes and resolves.  He had an episode of hematuria a few months ago.  He has been evaluated by his primary physician for BPH symptoms.  He is being referred to urology.  He reports urinary hesitancy and urgency, chiefly in the mornings.  He received the [employer] COVID-19 vaccine.  No peripheral neuropathy symptoms.  Objective:  Vital signs in last 24 hours:  Blood pressure (!) 144/87, pulse 86, temperature (!) 97 F (36.1 C), temperature source Tympanic, resp. rate 16, height 6' (1.829 m), weight 231 lb (104.8 kg), SpO2 100 %.    Lymphatics: No cervical, supraclavicular, axillary, or inguinal nodes Resp: Lungs clear bilaterally Cardio: Regular rate and rhythm GI: No hepatosplenomegaly, nontender, no mass                       Vascular: No leg edema Musculoskeletal: Examination of the right heel is unremarkable, no tenderness    Lab Results:  Lab Results  Component Value Date   WBC 13.5 (H) 10/09/2018   HGB 15.2 10/09/2018   HCT 44.4 10/09/2018   MCV 86.4 10/09/2018   PLT 294 10/09/2018   NEUTROABS 5.1 01/23/2018    CMP  Lab Results  Component Value Date   NA 141 06/23/2020   K 4.1 06/23/2020   CL 104 06/23/2020   CO2 27 06/23/2020   GLUCOSE 123 (H) 06/23/2020   BUN 15 06/23/2020   CREATININE 1.36 (H) 06/23/2020   CALCIUM 9.8 06/23/2020   PROT 7.5 05/13/2019   ALBUMIN 4.2 05/13/2019   AST 22 05/13/2019   ALT 37 05/13/2019   ALKPHOS 65 05/13/2019   BILITOT 0.5 05/13/2019   GFRNONAA 60 (L) 06/23/2020   GFRAA >60 11/19/2019    Lab Results  Component Value Date   CEA1 1.46 06/23/2020     Medications: I have reviewed the patient's current medications.   Assessment/Plan: 1. Adenocarcinoma  of the sigmoid colon, synchronous primary tumors separated by 2 cm, status post a sigmoid colectomy 05/26/2017 ? Stage III (T3N1) moderately differentiated adenocarcinoma, MSI-stable, no loss of mismatch repair protein expression, lymphovascular and perineural invasion noted ? Elevated preoperative CEA ? CT abdomen/pelvis 05/05/2018-wall thickening of the sigmoid colon, mesenteric nodes at the left common iliac, sigmoid diverticulosis ? CT chest 06/26/2017-negative for metastatic disease ? Cycle 1 CAPOX 06/29/2017 ? Cycle 2 CAPOX 07/20/2017 ? Cycle of Xeloda 08/17/2017 ? Cycle of Xeloda 09/09/2017 ? Cycle of Xeloda 09/30/2017 ? Cycle of Xeloda 11/06/2017 ? Cycle of Xeloda 11/27/2017 ? Cycle of Xeldoa 12/18/2017 ? CTs 05/21/2018- no evidence of recurrent colon cancer ? CTs 05/13/2019-no evidence of recurrent or metastatic disease ? CTs 06/23/2020-no evidence of recurrent disease, prostamegaly 2. Hypertension 3. Family history of colon cancer 4. High rectal polyp noted at rigid sigmoidoscopy during surgery 05/26/2017, no preoperative colonoscopy was performed  Colonoscopy 03/01/2018- hyperplastic polyp removed from the rectum 5. History of kidney stones 6. Mild renal insufficiency     Disposition: Mr. Geiler is in remission from colon cancer.  He will return for an office visit and CEA in 6 months.  He will be due for a surveillance colonoscopy with Dr. Dema Severin later this year.  The right heel pain may be  related to plantar fasciitis.  He will follow-up with his primary provider.  He will see urology for evaluation of hematuria and BPH symptoms.  Mr. Golda received a COVID-19 booster vaccine today.  Betsy Coder, MD  06/29/2020  8:45 AM

## 2020-06-29 NOTE — Progress Notes (Signed)
   Covid-19 Vaccination Clinic  Name:  SERGEY ISHLER    MRN: [id number] DOB: 12/05/1960  06/29/2020  Mr. Fortson was observed post Covid-19 immunization for 15 minutes without incident. He was provided with Vaccine Information Sheet and instruction to access the V-Safe system.   Mr. Kluth was instructed to call 911 with any severe reactions post vaccine: Marland Kitchen Difficulty breathing  . Swelling of face and throat  . A fast heartbeat  . A bad rash all over body  . Dizziness and weakness   Immunizations Administered    Name Date Dose VIS Date Route   PFIZER Comrnaty(Gray TOP) Covid-19 Vaccine 06/29/2020  9:57 AM 0.3 mL 04/30/2020 Intramuscular   Manufacturer: [employer]   Lot: [id number]   NDC: [phone]

## 2020-06-30 ENCOUNTER — Encounter: Payer: Self-pay | Admitting: *Deleted

## 2020-06-30 NOTE — Progress Notes (Signed)
Faxed referral order, demographics, and chart information to [facility] for Dr. Dema Severin. Due colonoscopy October 2022.

## 2020-12-28 ENCOUNTER — Inpatient Hospital Stay: Payer: BC Managed Care – PPO | Attending: Nurse Practitioner | Admitting: Nurse Practitioner

## 2020-12-28 ENCOUNTER — Inpatient Hospital Stay: Payer: BC Managed Care – PPO

## 2020-12-28 ENCOUNTER — Ambulatory Visit: Payer: BC Managed Care – PPO | Admitting: Nurse Practitioner

## 2020-12-28 ENCOUNTER — Other Ambulatory Visit: Payer: Self-pay

## 2020-12-28 ENCOUNTER — Encounter: Payer: Self-pay | Admitting: Nurse Practitioner

## 2020-12-28 VITALS — BP 155/88 | HR 85 | Temp 98.1°F | Resp 18 | Ht 72.0 in | Wt 220.6 lb

## 2020-12-28 DIAGNOSIS — Z87442 Personal history of urinary calculi: Secondary | ICD-10-CM | POA: Diagnosis not present

## 2020-12-28 DIAGNOSIS — Z9049 Acquired absence of other specified parts of digestive tract: Secondary | ICD-10-CM | POA: Diagnosis not present

## 2020-12-28 DIAGNOSIS — I10 Essential (primary) hypertension: Secondary | ICD-10-CM | POA: Diagnosis not present

## 2020-12-28 DIAGNOSIS — Z9221 Personal history of antineoplastic chemotherapy: Secondary | ICD-10-CM | POA: Diagnosis not present

## 2020-12-28 DIAGNOSIS — C187 Malignant neoplasm of sigmoid colon: Secondary | ICD-10-CM | POA: Diagnosis not present

## 2020-12-28 DIAGNOSIS — Z85038 Personal history of other malignant neoplasm of large intestine: Secondary | ICD-10-CM | POA: Insufficient documentation

## 2020-12-28 DIAGNOSIS — Z800 Family history of malignant neoplasm of digestive organs: Secondary | ICD-10-CM | POA: Diagnosis not present

## 2020-12-28 LAB — CEA (ACCESS): CEA (CHCC): 2.18 ng/mL (ref 0.00–5.00)

## 2020-12-28 LAB — CEA (IN HOUSE-CHCC): CEA (CHCC-In House): 2.23 ng/mL (ref 0.00–5.00)

## 2020-12-28 NOTE — Progress Notes (Signed)
  [facility] OFFICE PROGRESS NOTE   Diagnosis: Colon cancer  INTERVAL HISTORY:   Anthony Knight returns as scheduled.  No change in bowel habits.  No blood or pain with bowel movements.  No abdominal pain.  He has a good appetite.  Objective:  Vital signs in last 24 hours:  Blood pressure (!) 155/88, pulse 85, temperature 98.1 F (36.7 C), temperature source Oral, resp. rate 18, height 6' (1.829 m), weight 220 lb 9.6 oz (100.1 kg), SpO2 99 %.    HEENT: Neck without mass. Lymphatics: No palpable cervical, supraclavicular, axillary or inguinal lymph nodes. Resp: Lungs clear bilaterally. Cardio: Regular rate and rhythm. GI: Abdomen soft and nontender.  No hepatosplenomegaly.  No mass. Vascular: No leg edema.   Lab Results:  Lab Results  Component Value Date   WBC 13.5 (H) 10/09/2018   HGB 15.2 10/09/2018   HCT 44.4 10/09/2018   MCV 86.4 10/09/2018   PLT 294 10/09/2018   NEUTROABS 5.1 01/23/2018    Imaging:  No results found.  Medications: I have reviewed the patient's current medications.  Assessment/Plan: Adenocarcinoma of the sigmoid colon, synchronous primary tumors separated by 2 cm, status post a sigmoid colectomy 05/26/2017 Stage III (T3N1) moderately differentiated adenocarcinoma, MSI-stable, no loss of mismatch repair protein expression, lymphovascular and perineural invasion noted Elevated preoperative CEA CT abdomen/pelvis 05/05/2018-wall thickening of the sigmoid colon, mesenteric nodes at the left common iliac, sigmoid diverticulosis CT chest 06/26/2017-negative for metastatic disease Cycle 1 CAPOX 06/29/2017 Cycle 2 CAPOX 07/20/2017 Cycle of Xeloda 08/17/2017 Cycle of Xeloda 09/09/2017 Cycle of Xeloda 09/30/2017 Cycle of Xeloda 11/06/2017 Cycle of Xeloda 11/27/2017  Cycle of Xeldoa 12/18/2017  CTs 05/21/2018- no evidence of recurrent colon cancer CTs 05/13/2019-no evidence of recurrent or metastatic disease CTs 06/23/2020-no evidence of recurrent  disease, prostamegaly Hypertension Family history of colon cancer High rectal polyp noted at rigid sigmoidoscopy during surgery 05/26/2017, no preoperative colonoscopy was performed Colonoscopy 03/01/2018- hyperplastic polyp removed from the rectum History of kidney stones Mild renal insufficiency  Disposition: Anthony Knight remains in clinical remission from colon cancer.  We will follow-up on the CEA from today.  He is unsure when he is due for a colonoscopy.  We will provide him with Dr. Orest Dikes office number to determine timing of next colonoscopy.  Office note from 06/29/2020 indicates he is due for a surveillance colonoscopy this year.  He will return for CEA and follow-up visit in 6 months.  He will contact the office in the interim with any problems.    Ned Card ANP/GNP-BC   12/28/2020  9:20 AM

## 2020-12-29 ENCOUNTER — Telehealth: Payer: Self-pay

## 2020-12-29 LAB — CEA (IN HOUSE-CHCC): CEA (CHCC-In House): 1.69 ng/mL (ref 0.00–5.00)

## 2020-12-29 NOTE — Telephone Encounter (Signed)
-----   Message from Owens Shark, NP sent at 12/28/2020  1:18 PM EDT ----- Please let him know CEA is stable in normal range.  Follow-up as scheduled.

## 2020-12-29 NOTE — Telephone Encounter (Signed)
Called made pt aware of most recent CEA results

## 2021-03-04 ENCOUNTER — Other Ambulatory Visit: Payer: Self-pay | Admitting: Infectious Diseases

## 2021-03-04 DIAGNOSIS — E781 Pure hyperglyceridemia: Secondary | ICD-10-CM

## 2021-03-04 DIAGNOSIS — G4452 New daily persistent headache (NDPH): Secondary | ICD-10-CM

## 2021-03-04 DIAGNOSIS — R7303 Prediabetes: Secondary | ICD-10-CM

## 2021-03-04 DIAGNOSIS — N400 Enlarged prostate without lower urinary tract symptoms: Secondary | ICD-10-CM

## 2021-03-04 DIAGNOSIS — Z0000 Encounter for general adult medical examination without abnormal findings: Secondary | ICD-10-CM

## 2021-03-04 DIAGNOSIS — R42 Dizziness and giddiness: Secondary | ICD-10-CM

## 2021-03-05 ENCOUNTER — Ambulatory Visit
Admission: RE | Admit: 2021-03-05 | Discharge: 2021-03-05 | Disposition: A | Discharge status: Home / Self Care | Payer: BC Managed Care – PPO | Source: Ambulatory Visit | Attending: Infectious Diseases | Admitting: Infectious Diseases

## 2021-03-05 ENCOUNTER — Other Ambulatory Visit: Payer: Self-pay

## 2021-03-05 DIAGNOSIS — R42 Dizziness and giddiness: Secondary | ICD-10-CM | POA: Insufficient documentation

## 2021-03-05 DIAGNOSIS — R7303 Prediabetes: Secondary | ICD-10-CM | POA: Diagnosis present

## 2021-03-05 DIAGNOSIS — N400 Enlarged prostate without lower urinary tract symptoms: Secondary | ICD-10-CM | POA: Diagnosis present

## 2021-03-05 DIAGNOSIS — Z0000 Encounter for general adult medical examination without abnormal findings: Secondary | ICD-10-CM | POA: Diagnosis present

## 2021-03-05 DIAGNOSIS — G4452 New daily persistent headache (NDPH): Secondary | ICD-10-CM | POA: Insufficient documentation

## 2021-03-05 DIAGNOSIS — E781 Pure hyperglyceridemia: Secondary | ICD-10-CM | POA: Insufficient documentation

## 2021-03-09 ENCOUNTER — Telehealth: Payer: Self-pay | Admitting: *Deleted

## 2021-03-09 NOTE — Telephone Encounter (Signed)
Per [facility], they have reached out to patient without success to schedule his colonoscopy due 02/2021 with Dr. Dema Severin. Called patient and reminded him to f/u with [facility]. He agrees to do so, saying he has been out of town for a month.

## 2021-03-12 ENCOUNTER — Other Ambulatory Visit: Payer: Self-pay | Admitting: Infectious Diseases

## 2021-03-12 DIAGNOSIS — I6523 Occlusion and stenosis of bilateral carotid arteries: Secondary | ICD-10-CM

## 2021-03-23 ENCOUNTER — Other Ambulatory Visit: Payer: Self-pay | Admitting: Physical Medicine & Rehabilitation

## 2021-03-23 DIAGNOSIS — M542 Cervicalgia: Secondary | ICD-10-CM

## 2021-03-31 ENCOUNTER — Other Ambulatory Visit: Payer: Self-pay

## 2021-03-31 ENCOUNTER — Ambulatory Visit
Admission: RE | Admit: 2021-03-31 | Discharge: 2021-03-31 | Disposition: A | Discharge status: Home / Self Care | Payer: BC Managed Care – PPO | Source: Ambulatory Visit | Attending: Physical Medicine & Rehabilitation | Admitting: Physical Medicine & Rehabilitation

## 2021-03-31 DIAGNOSIS — M542 Cervicalgia: Secondary | ICD-10-CM | POA: Diagnosis present

## 2021-04-27 ENCOUNTER — Other Ambulatory Visit: Payer: Self-pay | Admitting: Physical Medicine & Rehabilitation

## 2021-04-27 DIAGNOSIS — M542 Cervicalgia: Secondary | ICD-10-CM

## 2021-04-27 DIAGNOSIS — M5412 Radiculopathy, cervical region: Secondary | ICD-10-CM

## 2021-04-29 ENCOUNTER — Other Ambulatory Visit: Payer: BC Managed Care – PPO

## 2021-05-03 ENCOUNTER — Telehealth: Payer: Self-pay | Admitting: *Deleted

## 2021-05-03 NOTE — Telephone Encounter (Signed)
Spoke w/patient to follow up on his referral back to Dr. Dema Severin for colonoscopy that was due in October. He reports that office did call, but he is having another medical issue with neck pain that he is trying to get resolved. He will reach out to Dr. Orest Dikes office when this is under control.

## 2021-05-04 ENCOUNTER — Other Ambulatory Visit: Payer: Self-pay

## 2021-05-04 ENCOUNTER — Ambulatory Visit
Admission: RE | Admit: 2021-05-04 | Discharge: 2021-05-04 | Disposition: A | Discharge status: Home / Self Care | Payer: BC Managed Care – PPO | Source: Ambulatory Visit | Attending: Physical Medicine & Rehabilitation | Admitting: Physical Medicine & Rehabilitation

## 2021-05-04 DIAGNOSIS — M5412 Radiculopathy, cervical region: Secondary | ICD-10-CM

## 2021-05-04 DIAGNOSIS — M542 Cervicalgia: Secondary | ICD-10-CM

## 2021-05-04 MED ORDER — TRIAMCINOLONE ACETONIDE 40 MG/ML IJ SUSP (RADIOLOGY)
60.0000 mg | Freq: Once | INTRAMUSCULAR | Status: AC
  Administered 2021-05-04: 60 mg via EPIDURAL

## 2021-05-04 MED ORDER — IOPAMIDOL (ISOVUE-M 300) INJECTION 61%
1.0000 mL | Freq: Once | INTRAMUSCULAR | Status: AC | PRN
  Administered 2021-05-04: 1 mL via EPIDURAL

## 2021-05-04 NOTE — Discharge Instructions (Signed)

## 2021-06-24 ENCOUNTER — Encounter: Payer: Self-pay | Admitting: Oncology

## 2021-06-28 ENCOUNTER — Encounter: Payer: Self-pay | Admitting: Oncology

## 2021-07-01 ENCOUNTER — Inpatient Hospital Stay: Payer: BC Managed Care – PPO | Admitting: Oncology

## 2021-07-01 ENCOUNTER — Inpatient Hospital Stay: Payer: BC Managed Care – PPO

## 2021-07-08 ENCOUNTER — Other Ambulatory Visit: Payer: Self-pay

## 2021-07-08 ENCOUNTER — Inpatient Hospital Stay: Payer: Managed Care, Other (non HMO) | Attending: Oncology

## 2021-07-08 ENCOUNTER — Inpatient Hospital Stay (HOSPITAL_BASED_OUTPATIENT_CLINIC_OR_DEPARTMENT_OTHER): Payer: Managed Care, Other (non HMO) | Admitting: Oncology

## 2021-07-08 VITALS — BP 128/80 | HR 79 | Temp 98.0°F | Resp 18 | Ht 72.0 in | Wt 226.2 lb

## 2021-07-08 DIAGNOSIS — C187 Malignant neoplasm of sigmoid colon: Secondary | ICD-10-CM | POA: Insufficient documentation

## 2021-07-08 DIAGNOSIS — Z87442 Personal history of urinary calculi: Secondary | ICD-10-CM | POA: Insufficient documentation

## 2021-07-08 DIAGNOSIS — Z8719 Personal history of other diseases of the digestive system: Secondary | ICD-10-CM | POA: Insufficient documentation

## 2021-07-08 DIAGNOSIS — I10 Essential (primary) hypertension: Secondary | ICD-10-CM | POA: Diagnosis not present

## 2021-07-08 DIAGNOSIS — N289 Disorder of kidney and ureter, unspecified: Secondary | ICD-10-CM | POA: Diagnosis not present

## 2021-07-08 DIAGNOSIS — Z800 Family history of malignant neoplasm of digestive organs: Secondary | ICD-10-CM | POA: Insufficient documentation

## 2021-07-08 DIAGNOSIS — I6523 Occlusion and stenosis of bilateral carotid arteries: Secondary | ICD-10-CM

## 2021-07-08 LAB — CEA (ACCESS): CEA (CHCC): 1.52 ng/mL (ref 0.00–5.00)

## 2021-07-08 NOTE — Progress Notes (Signed)
[name] OFFICE PROGRESS NOTE   Diagnosis: Colon cancer  INTERVAL HISTORY:   Anthony Knight returns as scheduled.  He reports having influenza approximately 2 weeks ago.  He was ill with nausea/vomiting, fever, and body aches.  His symptoms lasted for 10 days.  The symptoms have resolved.  He otherwise feels well.  No difficulty with bowel function.  No bleeding.  Objective:  Vital signs in last 24 hours:  Blood pressure 128/80, pulse 79, temperature 98 F (36.7 C), temperature source Oral, resp. rate 18, height 6' (1.829 m), weight 226 lb 3.2 oz (102.6 kg), SpO2 98 %.    Lymphatics: No cervical, supraclavicular, axillary, or inguinal nodes Resp: Lungs clear bilaterally Cardio: Regular rate and rhythm GI: No hepatosplenomegaly, nontender, no mass Vascular: No leg edema    Lab Results:  Lab Results  Component Value Date   WBC 13.5 (H) 10/09/2018   HGB 15.2 10/09/2018   HCT 44.4 10/09/2018   MCV 86.4 10/09/2018   PLT 294 10/09/2018   NEUTROABS 5.1 01/23/2018    CMP  Lab Results  Component Value Date   NA 141 06/23/2020   K 4.1 06/23/2020   CL 104 06/23/2020   CO2 27 06/23/2020   GLUCOSE 123 (H) 06/23/2020   BUN 15 06/23/2020   CREATININE 1.36 (H) 06/23/2020   CALCIUM 9.8 06/23/2020   PROT 7.5 05/13/2019   ALBUMIN 4.2 05/13/2019   AST 22 05/13/2019   ALT 37 05/13/2019   ALKPHOS 65 05/13/2019   BILITOT 0.5 05/13/2019   GFRNONAA 60 (L) 06/23/2020   GFRAA >60 11/19/2019    Lab Results  Component Value Date   CEA1 2.23 12/28/2020   CEA1 1.69 12/28/2020   CEA 2.18 12/28/2020   Medications: I have reviewed the patient's current medications.   Assessment/Plan: Adenocarcinoma of the sigmoid colon, synchronous primary tumors separated by 2 cm, status post a sigmoid colectomy 05/26/2017 Stage III (T3N1) moderately differentiated adenocarcinoma, MSI-stable, no loss of mismatch repair protein expression, lymphovascular and perineural invasion  noted Elevated preoperative CEA CT abdomen/pelvis 05/05/2018-wall thickening of the sigmoid colon, mesenteric nodes at the left common iliac, sigmoid diverticulosis CT chest 06/26/2017-negative for metastatic disease Cycle 1 CAPOX 06/29/2017 Cycle 2 CAPOX 07/20/2017 Cycle of Xeloda 08/17/2017 Cycle of Xeloda 09/09/2017 Cycle of Xeloda 09/30/2017 Cycle of Xeloda 11/06/2017 Cycle of Xeloda 11/27/2017  Cycle of Xeldoa 12/18/2017  CTs 05/21/2018- no evidence of recurrent colon cancer CTs 05/13/2019-no evidence of recurrent or metastatic disease CTs 06/23/2020-no evidence of recurrent disease, prostamegaly Hypertension Family history of colon cancer High rectal polyp noted at rigid sigmoidoscopy during surgery 05/26/2017, no preoperative colonoscopy was performed Colonoscopy 03/01/2018- hyperplastic polyp removed from the rectum History of kidney stones Mild renal insufficiency    Disposition: Anthony Knight is in clinical remission from colon cancer.  The CEA is normal.  He will return for an office visit and CEA in 6 months. We referred him to Dr. Dema Severin for a surveillance colonoscopy.  Anthony Knight, Anthony Knight  07/08/2021  8:01 AM

## 2021-07-09 ENCOUNTER — Encounter: Payer: Self-pay | Admitting: *Deleted

## 2021-07-09 NOTE — Progress Notes (Signed)
Faxed referral order demographics and chart information to [facility] for Dr. Annye English. Patient is due surveillance colonoscopy.

## 2021-07-12 ENCOUNTER — Telehealth: Payer: Self-pay

## 2021-07-12 NOTE — Telephone Encounter (Signed)
Pt verbalized understanding.

## 2021-07-12 NOTE — Telephone Encounter (Signed)
-----   Message from Ladell Pier, MD sent at 07/09/2021  4:26 PM EST ----- Please call patient, CEA is normal, follow-up as scheduled

## 2021-08-18 ENCOUNTER — Telehealth: Payer: Self-pay | Admitting: *Deleted

## 2021-08-18 NOTE — Telephone Encounter (Signed)
Called Mr. Mcfarlan to f/u on status of referral back to Dr. Dema Severin. He reports they have called him and he is working through insurance issues before returning ?

## 2021-12-27 ENCOUNTER — Encounter: Payer: Self-pay | Admitting: Oncology

## 2022-01-03 ENCOUNTER — Inpatient Hospital Stay: Payer: Commercial Managed Care - HMO | Attending: Oncology

## 2022-01-03 ENCOUNTER — Inpatient Hospital Stay: Payer: Commercial Managed Care - HMO | Admitting: Oncology

## 2022-01-03 VITALS — BP 134/85 | HR 75 | Temp 98.1°F | Resp 20 | Ht 72.0 in | Wt 232.4 lb

## 2022-01-03 DIAGNOSIS — Z800 Family history of malignant neoplasm of digestive organs: Secondary | ICD-10-CM | POA: Diagnosis not present

## 2022-01-03 DIAGNOSIS — C187 Malignant neoplasm of sigmoid colon: Secondary | ICD-10-CM | POA: Diagnosis not present

## 2022-01-03 DIAGNOSIS — I10 Essential (primary) hypertension: Secondary | ICD-10-CM | POA: Insufficient documentation

## 2022-01-03 DIAGNOSIS — Z87442 Personal history of urinary calculi: Secondary | ICD-10-CM | POA: Insufficient documentation

## 2022-01-03 DIAGNOSIS — N289 Disorder of kidney and ureter, unspecified: Secondary | ICD-10-CM | POA: Diagnosis not present

## 2022-01-03 DIAGNOSIS — Z85038 Personal history of other malignant neoplasm of large intestine: Secondary | ICD-10-CM | POA: Diagnosis present

## 2022-01-03 LAB — CEA (ACCESS): CEA (CHCC): 1.8 ng/mL (ref 0.00–5.00)

## 2022-01-03 NOTE — Progress Notes (Signed)
  [facility] OFFICE PROGRESS NOTE   Diagnosis: Colon cancer  INTERVAL HISTORY:   [name] returns as scheduled.  He feels well.  No difficulty with bowel function.  No complaint.  He did not undergo the surveillance colonoscopy.  He reports there was difficulty obtaining insurance clearance through Dr. Orest Dikes office.  Objective:  Vital signs in last 24 hours:  Blood pressure 134/85, pulse 75, temperature 98.1 F (36.7 C), resp. rate 20, height 6' (1.829 m), weight 232 lb 6.4 oz (105.4 kg), SpO2 98 %.    HEENT: Neck without mass Lymphatics: No cervical, supraclavicular, axillary, or inguinal nodes Resp: Lungs clear bilaterally Cardio: Regular rate and rhythm GI: No hepatosplenomegaly, no mass, nontender Vascular: No leg edema  Lab Results:  Lab Results  Component Value Date   WBC 13.5 (H) 10/09/2018   HGB 15.2 10/09/2018   HCT 44.4 10/09/2018   MCV 86.4 10/09/2018   PLT 294 10/09/2018   NEUTROABS 5.1 01/23/2018    CMP  Lab Results  Component Value Date   NA 141 06/23/2020   K 4.1 06/23/2020   CL 104 06/23/2020   CO2 27 06/23/2020   GLUCOSE 123 (H) 06/23/2020   BUN 15 06/23/2020   CREATININE 1.36 (H) 06/23/2020   CALCIUM 9.8 06/23/2020   PROT 7.5 05/13/2019   ALBUMIN 4.2 05/13/2019   AST 22 05/13/2019   ALT 37 05/13/2019   ALKPHOS 65 05/13/2019   BILITOT 0.5 05/13/2019   GFRNONAA 60 (L) 06/23/2020   GFRAA >60 11/19/2019    Lab Results  Component Value Date   CEA1 2.23 12/28/2020   CEA1 1.69 12/28/2020   CEA 1.80 01/03/2022    Medications: I have reviewed the patient's current medications.   Assessment/Plan: Adenocarcinoma of the sigmoid colon, synchronous primary tumors separated by 2 cm, status post a sigmoid colectomy 05/26/2017 Stage III (T3N1) moderately differentiated adenocarcinoma, MSI-stable, no loss of mismatch repair protein expression, lymphovascular and perineural invasion noted Elevated preoperative CEA CT  abdomen/pelvis 05/05/2018-wall thickening of the sigmoid colon, mesenteric nodes at the left common iliac, sigmoid diverticulosis CT chest 06/26/2017-negative for metastatic disease Cycle 1 CAPOX 06/29/2017 Cycle 2 CAPOX 07/20/2017 Cycle of Xeloda 08/17/2017 Cycle of Xeloda 09/09/2017 Cycle of Xeloda 09/30/2017 Cycle of Xeloda 11/06/2017 Cycle of Xeloda 11/27/2017  Cycle of Xeldoa 12/18/2017  CTs 05/21/2018- no evidence of recurrent colon cancer CTs 05/13/2019-no evidence of recurrent or metastatic disease CTs 06/23/2020-no evidence of recurrent disease, prostamegaly Hypertension Family history of colon cancer High rectal polyp noted at rigid sigmoidoscopy during surgery 05/26/2017, no preoperative colonoscopy was performed Colonoscopy 03/01/2018- hyperplastic polyp removed from the rectum History of kidney stones Mild renal insufficiency     Disposition: [name] is in clinical remission from colon cancer.  He will return for an office visit and CEA in 6 months.  I will contact Dr. Dema Severin to schedule a surveillance colonoscopy.  The last colonoscopy was in October 2019.  Betsy Coder, MD  01/03/2022  9:36 AM

## 2022-01-03 NOTE — Progress Notes (Deleted)
  [facility] OFFICE PROGRESS NOTE   Diagnosis:   INTERVAL HISTORY:   ***  Objective:  Vital signs in last 24 hours:  Blood pressure 134/85, pulse 75, temperature 98.1 F (36.7 C), resp. rate 20, height 6' (1.829 m), weight 232 lb 6.4 oz (105.4 kg), SpO2 98 %.    HEENT: *** Lymphatics: *** Resp: *** Cardio: *** GI: *** Vascular: *** Neuro:***  Skin:***   Portacath/PICC-without erythema  Lab Results:  Lab Results  Component Value Date   WBC 13.5 (H) 10/09/2018   HGB 15.2 10/09/2018   HCT 44.4 10/09/2018   MCV 86.4 10/09/2018   PLT 294 10/09/2018   NEUTROABS 5.1 01/23/2018    CMP  Lab Results  Component Value Date   NA 141 06/23/2020   K 4.1 06/23/2020   CL 104 06/23/2020   CO2 27 06/23/2020   GLUCOSE 123 (H) 06/23/2020   BUN 15 06/23/2020   CREATININE 1.36 (H) 06/23/2020   CALCIUM 9.8 06/23/2020   PROT 7.5 05/13/2019   ALBUMIN 4.2 05/13/2019   AST 22 05/13/2019   ALT 37 05/13/2019   ALKPHOS 65 05/13/2019   BILITOT 0.5 05/13/2019   GFRNONAA 60 (L) 06/23/2020   GFRAA >60 11/19/2019    Lab Results  Component Value Date   CEA1 2.23 12/28/2020   CEA1 1.69 12/28/2020   CEA 1.52 07/08/2021    No results found for: "INR", "LABPROT"  Imaging:  No results found.  Medications: I have reviewed the patient's current medications.   Assessment/Plan: Adenocarcinoma of the sigmoid colon, synchronous primary tumors separated by 2 cm, status post a sigmoid colectomy 05/26/2017 Stage III (T3N1) moderately differentiated adenocarcinoma, MSI-stable, no loss of mismatch repair protein expression, lymphovascular and perineural invasion noted Elevated preoperative CEA CT abdomen/pelvis 05/05/2018-wall thickening of the sigmoid colon, mesenteric nodes at the left common iliac, sigmoid diverticulosis CT chest 06/26/2017-negative for metastatic disease Cycle 1 CAPOX 06/29/2017 Cycle 2 CAPOX 07/20/2017 Cycle of Xeloda 08/17/2017 Cycle of Xeloda  09/09/2017 Cycle of Xeloda 09/30/2017 Cycle of Xeloda 11/06/2017 Cycle of Xeloda 11/27/2017  Cycle of Xeldoa 12/18/2017  CTs 05/21/2018- no evidence of recurrent colon cancer CTs 05/13/2019-no evidence of recurrent or metastatic disease CTs 06/23/2020-no evidence of recurrent disease, prostamegaly Hypertension Family history of colon cancer High rectal polyp noted at rigid sigmoidoscopy during surgery 05/26/2017, no preoperative colonoscopy was performed Colonoscopy 03/01/2018- hyperplastic polyp removed from the rectum History of kidney stones Mild renal insufficiency      Disposition: ***  Betsy Coder, MD  01/03/2022  9:18 AM

## 2022-02-14 ENCOUNTER — Other Ambulatory Visit: Payer: Self-pay | Admitting: Physician Assistant

## 2022-02-14 ENCOUNTER — Ambulatory Visit
Admission: RE | Admit: 2022-02-14 | Discharge: 2022-02-14 | Disposition: A | Discharge status: Home / Self Care | Payer: Commercial Managed Care - HMO | Source: Ambulatory Visit | Attending: Physician Assistant | Admitting: Physician Assistant

## 2022-02-14 DIAGNOSIS — R109 Unspecified abdominal pain: Secondary | ICD-10-CM | POA: Insufficient documentation

## 2022-04-12 ENCOUNTER — Other Ambulatory Visit: Payer: Self-pay | Admitting: Physical Medicine & Rehabilitation

## 2022-04-12 DIAGNOSIS — M9931 Osseous stenosis of neural canal of cervical region: Secondary | ICD-10-CM

## 2022-04-20 ENCOUNTER — Inpatient Hospital Stay: Admission: RE | Admit: 2022-04-20 | Payer: Commercial Managed Care - HMO | Source: Ambulatory Visit

## 2022-05-20 ENCOUNTER — Ambulatory Visit
Admission: RE | Admit: 2022-05-20 | Discharge: 2022-05-20 | Disposition: A | Discharge status: Home / Self Care | Payer: Commercial Managed Care - HMO | Source: Ambulatory Visit | Attending: Physical Medicine & Rehabilitation | Admitting: Physical Medicine & Rehabilitation

## 2022-05-20 DIAGNOSIS — M9931 Osseous stenosis of neural canal of cervical region: Secondary | ICD-10-CM

## 2022-05-20 MED ORDER — TRIAMCINOLONE ACETONIDE 40 MG/ML IJ SUSP (RADIOLOGY)
60.0000 mg | Freq: Once | INTRAMUSCULAR | Status: AC
  Administered 2022-05-20: 60 mg via EPIDURAL

## 2022-05-20 MED ORDER — IOPAMIDOL (ISOVUE-M 300) INJECTION 61%
1.0000 mL | Freq: Once | INTRAMUSCULAR | Status: AC | PRN
Start: 2022-05-20 — End: 2022-05-20
  Administered 2022-05-20: 1 mL via EPIDURAL

## 2022-05-20 NOTE — Discharge Instructions (Signed)

## 2022-06-30 ENCOUNTER — Encounter: Payer: Self-pay | Admitting: Oncology

## 2022-07-06 ENCOUNTER — Inpatient Hospital Stay: Payer: Medicaid Other | Attending: Oncology

## 2022-07-06 ENCOUNTER — Encounter: Payer: Self-pay | Admitting: Oncology

## 2022-07-06 ENCOUNTER — Inpatient Hospital Stay: Payer: Medicaid Other | Admitting: Oncology

## 2022-07-06 VITALS — BP 139/87 | HR 72 | Temp 98.1°F | Resp 20 | Ht 72.0 in | Wt 231.0 lb

## 2022-07-06 DIAGNOSIS — Z800 Family history of malignant neoplasm of digestive organs: Secondary | ICD-10-CM | POA: Diagnosis not present

## 2022-07-06 DIAGNOSIS — Z8719 Personal history of other diseases of the digestive system: Secondary | ICD-10-CM | POA: Insufficient documentation

## 2022-07-06 DIAGNOSIS — N289 Disorder of kidney and ureter, unspecified: Secondary | ICD-10-CM | POA: Insufficient documentation

## 2022-07-06 DIAGNOSIS — Z87442 Personal history of urinary calculi: Secondary | ICD-10-CM | POA: Diagnosis not present

## 2022-07-06 DIAGNOSIS — Z85038 Personal history of other malignant neoplasm of large intestine: Secondary | ICD-10-CM | POA: Insufficient documentation

## 2022-07-06 DIAGNOSIS — C187 Malignant neoplasm of sigmoid colon: Secondary | ICD-10-CM

## 2022-07-06 DIAGNOSIS — I10 Essential (primary) hypertension: Secondary | ICD-10-CM | POA: Diagnosis not present

## 2022-07-06 LAB — CEA (ACCESS): CEA (CHCC): 1.7 ng/mL (ref 0.00–5.00)

## 2022-07-06 NOTE — Progress Notes (Signed)
  [facility] OFFICE PROGRESS NOTE   Diagnosis: Colon cancer  INTERVAL HISTORY:   Anthony Knight returns as scheduled.  He feels well.  Good appetite and energy level.  No difficulty with bowel function.  No bleeding.  He has not scheduled a colonoscopy due to insurance issues.  He believes he now has insurance coverage.  He was diagnosed with "shingles "in October of last year.  He had postherpetic neuralgia until a few weeks ago.  This occurred after he had received the first shingles vaccine.  Objective:  Vital signs in last 24 hours:  Blood pressure 139/87, pulse 72, temperature 98.1 F (36.7 C), temperature source Oral, resp. rate 20, height 6' (1.829 m), weight 231 lb (104.8 kg), SpO2 98 %.    Lymphatics: No cervical, supraclavicular, axillary, or inguinal nodes Resp: Clear bilaterally Cardio: Regular rate and rhythm GI: Nontender, no mass, no hepatosplenomegaly Vascular: No leg edema  Skin: Scarring at the right lower back consistent with a resolved zoster rash  Lab Results:  Lab Results  Component Value Date   WBC 13.5 (H) 10/09/2018   HGB 15.2 10/09/2018   HCT 44.4 10/09/2018   MCV 86.4 10/09/2018   PLT 294 10/09/2018   NEUTROABS 5.1 01/23/2018    CMP  Lab Results  Component Value Date   NA 141 06/23/2020   K 4.1 06/23/2020   CL 104 06/23/2020   CO2 27 06/23/2020   GLUCOSE 123 (H) 06/23/2020   BUN 15 06/23/2020   CREATININE 1.36 (H) 06/23/2020   CALCIUM 9.8 06/23/2020   PROT 7.5 05/13/2019   ALBUMIN 4.2 05/13/2019   AST 22 05/13/2019   ALT 37 05/13/2019   ALKPHOS 65 05/13/2019   BILITOT 0.5 05/13/2019   GFRNONAA 60 (L) 06/23/2020   GFRAA >60 11/19/2019    Lab Results  Component Value Date   CEA1 2.23 12/28/2020   CEA1 1.69 12/28/2020   CEA 1.70 07/06/2022   Medications: I have reviewed the patient's current medications.   Assessment/Plan: Adenocarcinoma of the sigmoid colon, synchronous primary tumors separated by 2 cm, status  post a sigmoid colectomy 05/26/2017 Stage III (T3N1) moderately differentiated adenocarcinoma, MSI-stable, no loss of mismatch repair protein expression, lymphovascular and perineural invasion noted Elevated preoperative CEA CT abdomen/pelvis 05/05/2018-wall thickening of the sigmoid colon, mesenteric nodes at the left common iliac, sigmoid diverticulosis CT chest 06/26/2017-negative for metastatic disease Cycle 1 CAPOX 06/29/2017 Cycle 2 CAPOX 07/20/2017 Cycle of Xeloda 08/17/2017 Cycle of Xeloda 09/09/2017 Cycle of Xeloda 09/30/2017 Cycle of Xeloda 11/06/2017 Cycle of Xeloda 11/27/2017  Cycle of Xeldoa 12/18/2017  CTs 05/21/2018- no evidence of recurrent colon cancer CTs 05/13/2019-no evidence of recurrent or metastatic disease CTs 06/23/2020-no evidence of recurrent disease, prostamegaly Hypertension Family history of colon cancer High rectal polyp noted at rigid sigmoidoscopy during surgery 05/26/2017, no preoperative colonoscopy was performed Colonoscopy 03/01/2018- hyperplastic polyp removed from the rectum History of kidney stones Mild renal insufficiency     Disposition: Anthony Knight remains in clinical remission from colon cancer.  His last colonoscopy was in October 2019.  He says he will schedule a colonoscopy with Dr. Dema Severin for soon as possible.  He would like to continue follow-up at the cancer center.  He will return for an office visit and CEA in 1 year.  Anthony Coder, MD  07/06/2022  10:24 AM

## 2022-10-19 ENCOUNTER — Encounter
Admission: RE | Disposition: A | Discharge status: Home / Self Care | Payer: Self-pay | Source: Home / Self Care | Attending: Surgery

## 2022-10-19 ENCOUNTER — Encounter: Payer: Self-pay | Admitting: Surgery

## 2022-10-19 ENCOUNTER — Ambulatory Visit: Payer: Medicaid Other | Admitting: Certified Registered"

## 2022-10-19 ENCOUNTER — Other Ambulatory Visit: Payer: Self-pay

## 2022-10-19 ENCOUNTER — Ambulatory Visit
Admission: RE | Admit: 2022-10-19 | Discharge: 2022-10-19 | Disposition: A | Discharge status: Home / Self Care | Payer: Medicaid Other | Attending: Surgery | Admitting: Surgery

## 2022-10-19 DIAGNOSIS — Z1211 Encounter for screening for malignant neoplasm of colon: Secondary | ICD-10-CM | POA: Diagnosis present

## 2022-10-19 DIAGNOSIS — D128 Benign neoplasm of rectum: Secondary | ICD-10-CM | POA: Insufficient documentation

## 2022-10-19 DIAGNOSIS — Z09 Encounter for follow-up examination after completed treatment for conditions other than malignant neoplasm: Secondary | ICD-10-CM | POA: Insufficient documentation

## 2022-10-19 DIAGNOSIS — Z86010 Personal history of colonic polyps: Secondary | ICD-10-CM | POA: Insufficient documentation

## 2022-10-19 DIAGNOSIS — Z79899 Other long term (current) drug therapy: Secondary | ICD-10-CM | POA: Insufficient documentation

## 2022-10-19 DIAGNOSIS — I10 Essential (primary) hypertension: Secondary | ICD-10-CM | POA: Insufficient documentation

## 2022-10-19 DIAGNOSIS — Z85038 Personal history of other malignant neoplasm of large intestine: Secondary | ICD-10-CM | POA: Diagnosis not present

## 2022-10-19 DIAGNOSIS — Z08 Encounter for follow-up examination after completed treatment for malignant neoplasm: Secondary | ICD-10-CM | POA: Insufficient documentation

## 2022-10-19 DIAGNOSIS — Z87891 Personal history of nicotine dependence: Secondary | ICD-10-CM | POA: Diagnosis not present

## 2022-10-19 HISTORY — PX: COLONOSCOPY WITH PROPOFOL: SHX5780

## 2022-10-19 SURGERY — COLONOSCOPY WITH PROPOFOL
Anesthesia: General

## 2022-10-19 MED ORDER — PHENYLEPHRINE HCL (PRESSORS) 10 MG/ML IV SOLN
INTRAVENOUS | Status: DC | PRN
  Administered 2022-10-19 (×2): 160 ug via INTRAVENOUS

## 2022-10-19 MED ORDER — PROPOFOL 1000 MG/100ML IV EMUL
INTRAVENOUS | Status: AC
  Filled 2022-10-19: qty 100

## 2022-10-19 MED ORDER — PROPOFOL 10 MG/ML IV BOLUS
INTRAVENOUS | Status: AC
  Filled 2022-10-19: qty 20

## 2022-10-19 MED ORDER — SODIUM CHLORIDE 0.9 % IV SOLN: INTRAVENOUS | Status: DC

## 2022-10-19 MED ORDER — SODIUM CHLORIDE 0.9 % IV SOLN: INTRAVENOUS | Status: DC | PRN

## 2022-10-19 MED ORDER — PROPOFOL 500 MG/50ML IV EMUL
INTRAVENOUS | Status: DC | PRN
  Administered 2022-10-19: 50 mg via INTRAVENOUS
  Administered 2022-10-19: 120 ug/kg/min via INTRAVENOUS

## 2022-10-19 MED ORDER — ONDANSETRON HCL 4 MG/2ML IJ SOLN
INTRAMUSCULAR | Status: DC | PRN
  Administered 2022-10-19: 4 mg via INTRAVENOUS

## 2022-10-19 NOTE — Op Note (Signed)
[facility] Patient Name: Anthony Knight Procedure Date: 10/19/2022 7:17 AM MRN: [id number] Account #: [id number] Date of Birth: 07-04-1960 Admit Type: Outpatient Age: 62 Room: [facility] ENDO ROOM 2 Gender: Male Note Status: Finalized Instrument Name: Prentice Docker [id number] Procedure:             Colonoscopy Indications:           High risk colon cancer surveillance: Personal history                         of colon cancer Providers:             Sung Amabile MD, MD Referring MD:          Clydie Braun (Referring MD) Medicines:             Propofol per Anesthesia Complications:         No immediate complications. Procedure:             Pre-Anesthesia Assessment:                        - After reviewing the risks and benefits, the patient                         was deemed in satisfactory condition to undergo the                         procedure in an ambulatory setting.                        After obtaining informed consent, the colonoscope was                         passed under direct vision. Throughout the procedure,                         the patient's blood pressure, pulse, and oxygen                         saturations were monitored continuously. The                         Colonoscope was introduced through the anus and                         advanced to the the cecum, identified by the ileocecal                         valve. The colonoscopy was performed with ease. The                         patient tolerated the procedure well. The quality of                         the bowel preparation was adequate. Findings:      The perianal and digital rectal examinations were normal.      Two sessile polyps were found in the mid rectum and anastomosis. The       polyps were 2 to 3 mm in size. These were biopsied with a cold forceps  for histology. Estimated blood loss was minimal. Impression:            - Two 2 to 3 mm polyps in the mid rectum  and at the                         anastomosis. Biopsied. Recommendation:        - Discharge patient to home.                        - Resume previous diet.                        - Written discharge instructions were provided to the                         patient.                        - Await pathology results. Procedure Code(s):     --- Professional ---                        [phone], Colonoscopy, flexible; with biopsy, single or                         multiple Diagnosis Code(s):     --- Professional ---                        U04.540, Personal history of other malignant neoplasm                         of large intestine                        D12.8, Benign neoplasm of rectum CPT copyright 2022 American Medical Association. All rights reserved. The codes documented in this report are preliminary and upon coder review may  be revised to meet current compliance requirements. Dr. Harrie Foreman, MD Sung Amabile MD, MD 10/19/2022 8:02:17 AM This report has been signed electronically. Number of Addenda: 0 Note Initiated On: 10/19/2022 7:17 AM Scope Withdrawal Time: 0 hours 9 minutes 5 seconds  Total Procedure Duration: 0 hours 11 minutes 51 seconds  Estimated Blood Loss:  Estimated blood loss was minimal.      [facility]

## 2022-10-19 NOTE — H&P (Signed)
Subjective:  CC: History of colon cancer [Z85.038]  HPI: Anthony Knight is a 62 y.o. male who was referred by Jasmine December Fitzgeral* for evaluation of above. Due for f/u colonoscopy, last one several years ago. No symptoms.  Past Medical History: has a past medical history of Cancer (CMS/HHS-HCC) (2018), Hypertension, and Nephrolithiasis.  Past Surgical History: has no past surgical history on file.  Family History: family history includes Cancer in his maternal uncle; Colon cancer in his maternal grandfather and maternal uncle; Lung cancer in his maternal uncle; No Known Problems in his father, sister, sister, sister, and son; Recurrent UTI in his mother.  Social History: reports that he quit smoking about 33 years ago. His smoking use included cigarettes. He started smoking about 45 years ago. He has a 12 pack-year smoking history. He has never used smokeless tobacco. He reports that he does not currently use alcohol. He reports that he does not use drugs.  Current Medications: has a current medication list which includes the following prescription(s): aspirin, atorvastatin, codeine-guaifenesin, lactulose, losartan, tamsulosin, trazodone, and hydrochlorothiazide.  Allergies: No Known Allergies  ROS: A 15 point review of systems was performed and pertinent positives and negatives noted in HPI  Objective:   BP 109/71  Pulse 71  Ht 182.9 cm (6' 0.01")  Wt 97.8 kg (215 lb 9.8 oz)  BMI 29.24 kg/m  Constitutional : No distress, cooperative, alert Lymphatics/Throat: Supple with no lymphadenopathy Respiratory: Clear to auscultation bilaterally Cardiovascular: Regular rate and rhythm Gastrointestinal: Soft, non-tender, non-distended, no organomegaly. Musculoskeletal: Steady gait and movement Skin: Cool and moist Psychiatric: Normal affect, non-agitated, not confused    LABS: N/a  RADS: N/a  Assessment:   History of colon cancer [Z85.038]  Plan:   1. History of colon  cancer [Z85.038] R/b/a discussed. Risks include bleeding, perforation. Benefits include diagnostic, curative procedure if needed. Alternatives include continued observation. Pt verbalized understanding.  labs/images/medications/previous chart entries reviewed personally and relevant changes/updates noted above.

## 2022-10-19 NOTE — Anesthesia Preprocedure Evaluation (Signed)
Anesthesia Evaluation  Patient identified by MRN, date of birth, ID band Patient awake    Reviewed: Allergy & Precautions, H&P , NPO status , Patient's Chart, lab work & pertinent test results, reviewed documented beta blocker date and time   Airway Mallampati: II   Neck ROM: full    Dental  (+) Poor Dentition   Pulmonary neg pulmonary ROS, former smoker   Pulmonary exam normal        Cardiovascular Exercise Tolerance: Good hypertension, On Medications negative cardio ROS Normal cardiovascular exam Rhythm:regular Rate:Normal     Neuro/Psych  Headaches  negative psych ROS   GI/Hepatic negative GI ROS, Neg liver ROS,,,  Endo/Other  negative endocrine ROS    Renal/GU negative Renal ROS  negative genitourinary   Musculoskeletal   Abdominal   Peds  Hematology negative hematology ROS (+)   Anesthesia Other Findings Past Medical History: No date: Cancer (HCC)     Comment:  colon cancer No date: History of kidney stones     Comment:  early 2018 No date: Hypertension No date: Stricture of sigmoid colon (HCC) Past Surgical History: 03/01/2018: COLONOSCOPY; N/A     Comment:  Procedure: COLONOSCOPY;  Surgeon: Andria Meuse,               MD;  Location: [facility];  Service: General;  Laterality:               N/A; 10/10/2018: CYSTOSCOPY W/ URETERAL STENT PLACEMENT; Bilateral     Comment:  Procedure: CYSTOSCOPY WITH BILATERAL RETROGRADE               PYELOGRAM/URETERAL BILATERAL STENT PLACEMENT AND               LASER,RIGHT URETERAL LASER LITHOTRIPSY,LEFT DIAGNOSTIC               RETROGRADE;  Surgeon: Sebastian Ache, MD;  Location: [facility]               ORS;  Service: Urology;  Laterality: Bilateral; 05/26/2017: LAPAROSCOPIC SIGMOID COLECTOMY; N/A     Comment:  Procedure: LAPAROSCOPIC ASSISTED SIGMOID COLECTOMY;                Surgeon: Griselda Miner, MD;  Location: [facility] OR;  Service:               General;  Laterality:  N/A;  ERAS PATHWAY 03/01/2018: PORT-A-CATH REMOVAL; N/A     Comment:  Procedure: REMOVAL PORT-A-CATH;  Surgeon: Griselda Miner, MD;  Location: [facility];  Service: General;                Laterality: N/A; 06/22/2017: PORTACATH PLACEMENT; Left     Comment:  Procedure: INSERTION PORT-A-CATH;  Surgeon: Griselda Miner, MD;  Location: [facility];  Service:               General;  Laterality: Left; No date: rectal tear     Comment:  Per patient report 05/26/2017: SIGMOIDOSCOPY; N/A     Comment:  Procedure: RIGID SIGMOIDOSCOPY;  Surgeon: Griselda Miner, MD;  Location: [facility] OR;  Service: General;                Laterality: N/A; 2002: URETERAL STENT PLACEMENT  Comment:  kidney stones removal as well BMI    Body Mass Index: 28.48 kg/m     Reproductive/Obstetrics negative OB ROS                             Anesthesia Physical Anesthesia Plan  ASA: 2  Anesthesia Plan: General   Post-op Pain Management:    Induction:   PONV Risk Score and Plan:   Airway Management Planned:   Additional Equipment:   Intra-op Plan:   Post-operative Plan:   Informed Consent: I have reviewed the patients History and Physical, chart, labs and discussed the procedure including the risks, benefits and alternatives for the proposed anesthesia with the patient or authorized representative who has indicated his/her understanding and acceptance.     Dental Advisory Given  Plan Discussed with: CRNA  Anesthesia Plan Comments:        Anesthesia Quick Evaluation

## 2022-10-19 NOTE — Anesthesia Postprocedure Evaluation (Signed)
Anesthesia Post Note  Patient: Anthony Knight  Procedure(s) Performed: COLONOSCOPY WITH PROPOFOL  Patient location during evaluation: PACU Anesthesia Type: General Level of consciousness: awake and alert Pain management: pain level controlled Vital Signs Assessment: post-procedure vital signs reviewed and stable Respiratory status: spontaneous breathing, nonlabored ventilation, respiratory function stable and patient connected to nasal cannula oxygen Cardiovascular status: blood pressure returned to baseline and stable Postop Assessment: no apparent nausea or vomiting Anesthetic complications: no   No notable events documented.   Last Vitals:  Vitals:   10/19/22 0812 10/19/22 0822  BP: 95/61 98/66  Pulse: 60 (!) 57  Resp: 20 17  Temp:    SpO2: 93% 98%    Last Pain:  Vitals:   10/19/22 0822  TempSrc:   PainSc: 0-No pain                 Yevette Edwards

## 2022-10-19 NOTE — Transfer of Care (Signed)
Immediate Anesthesia Transfer of Care Note  Patient: Anthony Knight  Procedure(s) Performed: COLONOSCOPY WITH PROPOFOL  Patient Location: PACU  Anesthesia Type:MAC  Level of Consciousness: awake  Airway & Oxygen Therapy: Patient Spontanous Breathing  Post-op Assessment: Report given to RN and Post -op Vital signs reviewed and stable  Post vital signs: Reviewed  Last Vitals:  Vitals Value Taken Time  BP 96/61 10/19/22 0802  Temp 36.1 C 10/19/22 0802  Pulse 60 10/19/22 0802  Resp 21 10/19/22 0802  SpO2 93 % 10/19/22 0802    Last Pain:  Vitals:   10/19/22 0802  TempSrc: Temporal  PainSc: Asleep         Complications: No notable events documented.

## 2022-10-20 ENCOUNTER — Encounter: Payer: Self-pay | Admitting: Surgery

## 2023-01-27 ENCOUNTER — Other Ambulatory Visit (HOSPITAL_COMMUNITY): Payer: Self-pay

## 2023-01-27 MED ORDER — HYDROCHLOROTHIAZIDE 25 MG PO TABS: 25.0000 mg | ORAL_TABLET | Freq: Every day | ORAL | 98 refills | Status: AC

## 2023-02-13 DIAGNOSIS — E7800 Pure hypercholesterolemia, unspecified: Secondary | ICD-10-CM | POA: Insufficient documentation

## 2023-03-30 ENCOUNTER — Other Ambulatory Visit (HOSPITAL_COMMUNITY): Payer: Self-pay

## 2023-03-30 ENCOUNTER — Encounter: Payer: Self-pay | Admitting: Oncology

## 2023-03-30 MED ORDER — TRAZODONE HCL 50 MG PO TABS
50.0000 mg | ORAL_TABLET | Freq: Every day | ORAL | 3 refills | Status: AC
  Filled 2023-03-30: qty 90, 90d supply, fill #0
  Filled 2023-06-26 – 2023-06-29 (×2): qty 90, 90d supply, fill #1
  Filled 2023-09-26: qty 90, 90d supply, fill #2
  Filled 2023-12-19: qty 90, 90d supply, fill #3

## 2023-04-27 ENCOUNTER — Other Ambulatory Visit (HOSPITAL_COMMUNITY): Payer: Self-pay

## 2023-04-27 MED ORDER — LOSARTAN POTASSIUM 100 MG PO TABS
100.0000 mg | ORAL_TABLET | Freq: Every day | ORAL | 3 refills | Status: DC
  Filled 2023-04-27: qty 90, 45d supply, fill #0
  Filled 2023-06-26: qty 90, 45d supply, fill #1

## 2023-05-09 ENCOUNTER — Other Ambulatory Visit (HOSPITAL_COMMUNITY): Payer: Self-pay

## 2023-05-11 ENCOUNTER — Other Ambulatory Visit (HOSPITAL_COMMUNITY): Payer: Self-pay

## 2023-05-11 MED ORDER — LOSARTAN POTASSIUM 100 MG PO TABS
100.0000 mg | ORAL_TABLET | Freq: Two times a day (BID) | ORAL | 3 refills | Status: DC
  Filled 2023-05-11 – 2023-06-12 (×3): qty 180, 90d supply, fill #0
  Filled 2023-09-26: qty 180, 90d supply, fill #1
  Filled 2023-12-19: qty 180, 90d supply, fill #2
  Filled 2024-03-06: qty 180, 90d supply, fill #3

## 2023-05-29 ENCOUNTER — Other Ambulatory Visit (HOSPITAL_COMMUNITY): Payer: Self-pay

## 2023-05-29 MED ORDER — TAMSULOSIN HCL 0.4 MG PO CAPS
0.4000 mg | ORAL_CAPSULE | Freq: Every day | ORAL | 3 refills | Status: DC
  Filled 2023-05-29: qty 90, 90d supply, fill #0
  Filled 2023-08-30: qty 90, 90d supply, fill #1
  Filled 2023-11-21: qty 90, 90d supply, fill #2
  Filled 2024-02-19: qty 90, 90d supply, fill #3

## 2023-06-01 DIAGNOSIS — R7303 Prediabetes: Secondary | ICD-10-CM | POA: Insufficient documentation

## 2023-06-06 ENCOUNTER — Encounter: Payer: Self-pay | Admitting: Oncology

## 2023-06-12 ENCOUNTER — Other Ambulatory Visit (HOSPITAL_COMMUNITY): Payer: Self-pay

## 2023-06-13 ENCOUNTER — Other Ambulatory Visit (HOSPITAL_COMMUNITY): Payer: Self-pay

## 2023-06-27 ENCOUNTER — Other Ambulatory Visit: Payer: Self-pay

## 2023-06-27 ENCOUNTER — Other Ambulatory Visit (HOSPITAL_COMMUNITY): Payer: Self-pay

## 2023-06-29 ENCOUNTER — Other Ambulatory Visit (HOSPITAL_COMMUNITY): Payer: Self-pay

## 2023-07-05 ENCOUNTER — Encounter: Payer: Self-pay | Admitting: *Deleted

## 2023-07-05 ENCOUNTER — Inpatient Hospital Stay: Payer: Medicaid Other

## 2023-07-05 ENCOUNTER — Inpatient Hospital Stay: Payer: Medicaid Other | Attending: Oncology | Admitting: Oncology

## 2023-07-05 ENCOUNTER — Other Ambulatory Visit: Payer: Medicaid Other

## 2023-07-05 VITALS — BP 125/87 | HR 76 | Temp 98.2°F | Resp 18 | Ht 72.0 in | Wt 232.0 lb

## 2023-07-05 DIAGNOSIS — Z87442 Personal history of urinary calculi: Secondary | ICD-10-CM | POA: Insufficient documentation

## 2023-07-05 DIAGNOSIS — N289 Disorder of kidney and ureter, unspecified: Secondary | ICD-10-CM | POA: Insufficient documentation

## 2023-07-05 DIAGNOSIS — R3911 Hesitancy of micturition: Secondary | ICD-10-CM | POA: Insufficient documentation

## 2023-07-05 DIAGNOSIS — I10 Essential (primary) hypertension: Secondary | ICD-10-CM | POA: Insufficient documentation

## 2023-07-05 DIAGNOSIS — Z800 Family history of malignant neoplasm of digestive organs: Secondary | ICD-10-CM | POA: Diagnosis not present

## 2023-07-05 DIAGNOSIS — C187 Malignant neoplasm of sigmoid colon: Secondary | ICD-10-CM

## 2023-07-05 DIAGNOSIS — Z85038 Personal history of other malignant neoplasm of large intestine: Secondary | ICD-10-CM | POA: Insufficient documentation

## 2023-07-05 DIAGNOSIS — Z9049 Acquired absence of other specified parts of digestive tract: Secondary | ICD-10-CM | POA: Diagnosis not present

## 2023-07-05 LAB — CEA (ACCESS): CEA (CHCC): 1.38 ng/mL (ref 0.00–5.00)

## 2023-07-05 NOTE — Progress Notes (Signed)
   [facility] OFFICE PROGRESS NOTE   Diagnosis: Colon cancer  INTERVAL HISTORY:   Anthony Knight returns as scheduled.  He feels well.  He sometimes has urinary hesitancy in the morning.  He is scheduled to see urology next week.  No difficulty with bowel function.  No bleeding.  Good appetite.  He reports intentional weight loss last year, but he has regained weight. He underwent a colonoscopy 10/19/2022.  2 polyps were removed from the rectum and anastomosis.  The pathology was benign.  Objective:  Vital signs in last 24 hours:  Blood pressure 125/87, pulse 76, temperature 98.2 F (36.8 C), resp. rate 18, height 6' (1.829 m), weight 232 lb (105.2 kg), SpO2 98%.    Lymphatics: No cervical, supraclavicular, axillary, or inguinal nodes Resp: Lungs clear bilaterally Cardio: Regular rate and rhythm GI: No hepatosplenomegaly, no mass, nontender Vascular: No leg edema    Lab Results:  Lab Results  Component Value Date   WBC 13.5 (H) 10/09/2018   HGB 15.2 10/09/2018   HCT 44.4 10/09/2018   MCV 86.4 10/09/2018   PLT 294 10/09/2018   NEUTROABS 5.1 01/23/2018    CMP  Lab Results  Component Value Date   NA 141 06/23/2020   K 4.1 06/23/2020   CL 104 06/23/2020   CO2 27 06/23/2020   GLUCOSE 123 (H) 06/23/2020   BUN 15 06/23/2020   CREATININE 1.36 (H) 06/23/2020   CALCIUM 9.8 06/23/2020   PROT 7.5 05/13/2019   ALBUMIN 4.2 05/13/2019   AST 22 05/13/2019   ALT 37 05/13/2019   ALKPHOS 65 05/13/2019   BILITOT 0.5 05/13/2019   GFRNONAA 60 (L) 06/23/2020   GFRAA >60 11/19/2019    Lab Results  Component Value Date   CEA1 2.23 12/28/2020   CEA1 1.69 12/28/2020   CEA 1.70 07/06/2022     Medications: I have reviewed the patient's current medications.   Assessment/Plan: Adenocarcinoma of the sigmoid colon, synchronous primary tumors separated by 2 cm, status post a sigmoid colectomy 05/26/2017 Stage III (T3N1) moderately differentiated adenocarcinoma, MSI-stable,  no loss of mismatch repair protein expression, lymphovascular and perineural invasion noted Elevated preoperative CEA CT abdomen/pelvis 05/05/2018-wall thickening of the sigmoid colon, mesenteric nodes at the left common iliac, sigmoid diverticulosis CT chest 06/26/2017-negative for metastatic disease Cycle 1 CAPOX 06/29/2017 Cycle 2 CAPOX 07/20/2017 Cycle of Xeloda 08/17/2017 Cycle of Xeloda 09/09/2017 Cycle of Xeloda 09/30/2017 Cycle of Xeloda 11/06/2017 Cycle of Xeloda 11/27/2017  Cycle of Xeldoa 12/18/2017  CTs 05/21/2018- no evidence of recurrent colon cancer CTs 05/13/2019-no evidence of recurrent or metastatic disease CTs 06/23/2020-no evidence of recurrent disease, prostamegaly Hypertension Family history of colon cancer High rectal polyp noted at rigid sigmoidoscopy during surgery 05/26/2017, no preoperative colonoscopy was performed Colonoscopy 03/01/2018- hyperplastic polyp removed from the rectum Colonoscopy 10/19/2022-polyps removed from the rectum and anastomosis: Colonic mucosa with submucosal lymphoid inflammation and fibrosis, and hyperplastic polyp History of kidney stones Mild renal insufficiency      Disposition: Anthony Knight is in clinical remission from colon cancer.  He would like to continue follow-up at the Cancer center.  He will return for an office visit and CEA in 1 year.  He will continue colonoscopy surveillance.  He will follow-up with urology for evaluation of the urinary hesitancy and elevated PSA.  Thornton Papas, MD  07/05/2023  10:32 AM

## 2023-07-12 ENCOUNTER — Ambulatory Visit: Payer: Medicaid Other | Admitting: Urology

## 2023-07-12 VITALS — BP 141/76 | HR 70 | Ht 72.0 in | Wt 229.0 lb

## 2023-07-12 DIAGNOSIS — N401 Enlarged prostate with lower urinary tract symptoms: Secondary | ICD-10-CM | POA: Diagnosis not present

## 2023-07-12 DIAGNOSIS — R972 Elevated prostate specific antigen [PSA]: Secondary | ICD-10-CM

## 2023-07-12 DIAGNOSIS — R3911 Hesitancy of micturition: Secondary | ICD-10-CM | POA: Diagnosis not present

## 2023-07-12 LAB — URINALYSIS, COMPLETE
Bilirubin, UA: NEGATIVE
Glucose, UA: NEGATIVE
Ketones, UA: NEGATIVE
Leukocytes,UA: NEGATIVE
Nitrite, UA: NEGATIVE
Protein,UA: NEGATIVE
RBC, UA: NEGATIVE
Specific Gravity, UA: 1.025 (ref 1.005–1.030)
Urobilinogen, Ur: 0.2 mg/dL (ref 0.2–1.0)
pH, UA: 5.5 (ref 5.0–7.5)

## 2023-07-12 LAB — MICROSCOPIC EXAMINATION: Bacteria, UA: NONE SEEN

## 2023-07-12 LAB — BLADDER SCAN AMB NON-IMAGING: PVR: 1 WU

## 2023-07-12 NOTE — Progress Notes (Signed)
Anthony Knight,acting as a scribe for Anthony Scotland, MD.,have documented all relevant documentation on the behalf of Anthony Scotland, MD,as directed by  Anthony Scotland, MD while in the presence of Anthony Scotland, MD.  07/12/23 10:47 AM   Anthony Knight 07-25-1960 [id number]  Referring provider: Mick Sell, MD [address] [place],  [place] [address]  Chief Complaint  Patient presents with   Urinary Frequency    HPI: 63 year-old male who presents today for further evaluation of urinary issues. Comorbidities include a personal history of stage 3 colon cancer diagnosed in 2019.   His last cross-sectional imaging was in the form of CT stone protocol in 2023, which I am personally reviewing. It does show a lot of changes with fairly significant prostatomegaly.   His most recent PSA was 5.39 on 06/01/2023. His PSA trend over the last several years have been stably elevated.   His UA today is negative.  He is currently on Flomax, recently started in January by his PCP. He primarily complains of nocturia x2 and urgency. During the day, urgency is sometimes present, but otherwise, urination is fine. He expresses concern about taking multiple medications and prefers not to add more. He is interested in exploring procedural options to address the urinary issues. He experiences a psychological trigger to urinate when hearing running water or upon arriving home. He is planning a trip and would like to address the issue after returning.  Results for orders placed or performed in visit on 07/12/23  Bladder Scan (Post Void Residual) in office  Result Value Ref Range   PVR 1.0 WU    IPSS     Row Name 07/12/23 1000         International Prostate Symptom Score   How often have you had the sensation of not emptying your bladder? Less than 1 in 5     How often have you had to urinate less than every two hours? Not at All     How often have you found you stopped and started again  several times when you urinated? Less than 1 in 5 times     How often have you found it difficult to postpone urination? Less than half the time     How often have you had a weak urinary stream? Less than 1 in 5 times     How often have you had to strain to start urination? Less than 1 in 5 times     How many times did you typically get up at night to urinate? 2 Times     Total IPSS Score 8       Quality of Life due to urinary symptoms   If you were to spend the rest of your life with your urinary condition just the way it is now how would you feel about that? Unhappy              Score:  1-7 Mild 8-19 Moderate 20-35 Severe    PMH: Past Medical History:  Diagnosis Date   Cancer (HCC)    colon cancer   History of kidney stones    early 2018   Hypertension    Stricture of sigmoid colon [facility])     Surgical History: Past Surgical History:  Procedure Laterality Date   COLONOSCOPY N/A 03/01/2018   Procedure: COLONOSCOPY;  Surgeon: Andria Meuse, MD;  Location: [facility];  Service: General;  Laterality: N/A;   COLONOSCOPY WITH PROPOFOL N/A  10/19/2022   Procedure: COLONOSCOPY WITH PROPOFOL;  Surgeon: Sung Amabile, DO;  Location: [facility] ENDOSCOPY;  Service: General;  Laterality: N/A;   CYSTOSCOPY W/ URETERAL STENT PLACEMENT Bilateral 10/10/2018   Procedure: CYSTOSCOPY WITH BILATERAL RETROGRADE PYELOGRAM/URETERAL BILATERAL STENT PLACEMENT AND LASER,RIGHT URETERAL LASER LITHOTRIPSY,LEFT DIAGNOSTIC RETROGRADE;  Surgeon: Sebastian Ache, MD;  Location: [facility];  Service: Urology;  Laterality: Bilateral;   LAPAROSCOPIC SIGMOID COLECTOMY N/A 05/26/2017   Procedure: LAPAROSCOPIC ASSISTED SIGMOID COLECTOMY;  Surgeon: Griselda Miner, MD;  Location: [facility] OR;  Service: General;  Laterality: N/A;  ERAS PATHWAY   PORT-A-CATH REMOVAL N/A 03/01/2018   Procedure: REMOVAL PORT-A-CATH;  Surgeon: Griselda Miner, MD;  Location: [facility];  Service: General;  Laterality: N/A;   PORTACATH PLACEMENT Left  06/22/2017   Procedure: INSERTION PORT-A-CATH;  Surgeon: Griselda Miner, MD;  Location: [facility];  Service: General;  Laterality: Left;   rectal tear     Per patient report   SIGMOIDOSCOPY N/A 05/26/2017   Procedure: RIGID SIGMOIDOSCOPY;  Surgeon: Griselda Miner, MD;  Location: [facility] OR;  Service: General;  Laterality: N/A;   URETERAL STENT PLACEMENT  2002   kidney stones removal as well    Home Medications:  Allergies as of 07/12/2023   No Known Allergies      Medication List        Accurate as of July 12, 2023 10:47 AM. If you have any questions, ask your nurse or doctor.          aspirin EC 81 MG tablet Take 1 tablet (81 mg total) by mouth daily.   atorvastatin 10 MG tablet Commonly known as: LIPITOR Take 10 mg by mouth daily.   hydrochlorothiazide 25 MG tablet Commonly known as: HYDRODIURIL Take 1 tablet (25 mg total) by mouth daily.   losartan 100 MG tablet Commonly known as: COZAAR Take 1 tablet (100 mg total) by mouth 2 (two) times daily.   tamsulosin 0.4 MG Caps capsule Commonly known as: FLOMAX Take 1 capsule (0.4 mg total) by mouth daily. Take 30 minutes after same meal each day.   traZODone 50 MG tablet Commonly known as: DESYREL Take 1 tablet (50 mg total) by mouth at bedtime        Family History: Family History  Problem Relation Age of Onset   Cancer Father        prostate   Cancer Maternal Grandfather        lung and colon   Cancer Maternal Uncle        colon   Cancer Maternal Uncle        spleen   Cancer Maternal Uncle     Social History:  reports that he quit smoking about 38 years ago. He has never used smokeless tobacco. He reports current alcohol use. He reports that he does not use drugs.   Physical Exam: BP (!) 141/76   Pulse 70   Ht 6' (1.829 m)   Wt 229 lb (103.9 kg)   BMI 31.06 kg/m   Constitutional:  Alert and oriented, No acute distress. HEENT: [place] AT, moist mucus membranes.  Trachea midline, no  masses. Neurologic: Grossly intact, no focal deficits, moving all 4 extremities. Psychiatric: Normal mood and affect.   Pertinent Imaging: EXAM: CT ABDOMEN AND PELVIS WITHOUT CONTRAST   TECHNIQUE: Multidetector CT imaging of the abdomen and pelvis was performed following the standard protocol without IV contrast.   RADIATION DOSE REDUCTION: This exam was performed according to the departmental  dose-optimization program which includes automated exposure control, adjustment of the mA and/or kV according to patient size and/or use of iterative reconstruction technique.   COMPARISON:  June 23, 2020.   FINDINGS: Lower chest: No acute abnormality.   Hepatobiliary: Hepatic steatosis. No gallstones or biliary dilatation is noted.   Pancreas: Unremarkable. No pancreatic ductal dilatation or surrounding inflammatory changes.   Spleen: Normal in size without focal abnormality.   Adrenals/Urinary Tract: Adrenal glands are unremarkable. Kidneys are normal, without renal calculi, focal lesion, or hydronephrosis. Bladder is unremarkable.   Stomach/Bowel: Stomach is within normal limits. Appendix appears normal. No evidence of bowel wall thickening, distention, or inflammatory changes. Postsurgical changes are seen involving the sigmoid colon.   Vascular/Lymphatic: No significant vascular findings are present. No enlarged abdominal or pelvic lymph nodes.   Reproductive: Stable moderate prostatic enlargement is noted.   Other: No abdominal wall hernia or abnormality. No abdominopelvic ascites.   Musculoskeletal: No acute or significant osseous findings.   IMPRESSION: Hepatic steatosis.   Stable moderate prostatic enlargement.   No acute abnormality seen in the abdomen or pelvis.     Electronically Signed   By: Lupita Raider M.D.   On: 02/14/2022 16:00  This was personally reviewed and I agree with the radiologic interpretation.   Assessment & Plan:    1. BPH with  urinary hesitancy  - Significantly enlarged prostate, contributing to urinary symptoms. Flomax is currently being used but may not be sufficient alone. - Consider adding Finasteride to shrink the prostate, understanding it takes 3-6 months to assess effectiveness. Discuss potential side effects, including sexual dysfunction. - Discussed procedural options, including Urolift, laser therapy, and traditional TURP, depending on prostate size and anatomy. - We discussed alternatives including TURP, PVP and holmium laser enucleation of the prostate for large glands.  Minimally invasive options of UroLift and water vapor ablation were discussed. Differences between the surgical procedures were discussed as well as the risks and benefits of each. Risk of bleeding, infection, damage surrounding structures, injury to the bladder/ urethral, bladder neck contracture, ureteral stricture, retrograde ejaculation, stress/ urge incontinence, exacerbation of irritative voiding symptoms were all discussed in detail. - Plan to perform cystoscopy and TRUS to evaluate prostate size and anatomy, and to rule out other causes of symptoms. - Encourage proactive management to preserve bladder function and avoid future complications.  2. Elevated PSA - PSA is elevated but stable, likely due to prostate enlargement. - Plan to perform a rectal exam during the cystoscopy to further evaluate prostate health. - Defer further workup unless changes in PSA trend or symptoms suggest malignancy.  Return in about 4 weeks (around 08/09/2023) for Cysto-TRUS and DRE.  I have reviewed the above documentation for accuracy and completeness, and I agree with the above.   Anthony Scotland, MD  [facility] [address], [address] [place], [place] [address] [phone]  I spent 47 total minutes on the day of the encounter including pre-visit review of the medical record, face-to-face time with the patient, and post  visit ordering of labs/imaging/tests.

## 2023-08-03 ENCOUNTER — Other Ambulatory Visit (HOSPITAL_COMMUNITY): Payer: Self-pay

## 2023-08-14 ENCOUNTER — Encounter: Payer: Self-pay | Admitting: Oncology

## 2023-08-14 ENCOUNTER — Other Ambulatory Visit (HOSPITAL_COMMUNITY): Payer: Self-pay

## 2023-08-14 MED ORDER — GUAIFENESIN-CODEINE 100-10 MG/5ML PO SOLN
5.0000 mL | Freq: Four times a day (QID) | ORAL | 0 refills | Status: DC | PRN
  Filled 2023-08-14: qty 118, 6d supply, fill #0

## 2023-08-14 MED ORDER — PREDNISONE 20 MG PO TABS
ORAL_TABLET | ORAL | 0 refills | Status: AC
Start: 2023-08-14 — End: ?
  Filled 2023-08-14: qty 12, 6d supply, fill #0

## 2023-08-16 ENCOUNTER — Other Ambulatory Visit (HOSPITAL_COMMUNITY): Payer: Self-pay

## 2023-08-17 ENCOUNTER — Other Ambulatory Visit (HOSPITAL_COMMUNITY): Payer: Self-pay

## 2023-08-17 MED ORDER — ATORVASTATIN CALCIUM 20 MG PO TABS
20.0000 mg | ORAL_TABLET | Freq: Every day | ORAL | 3 refills | Status: AC
  Filled 2023-08-17: qty 90, 90d supply, fill #0
  Filled 2023-11-09: qty 90, 90d supply, fill #1
  Filled 2024-02-07: qty 90, 90d supply, fill #2

## 2023-08-30 ENCOUNTER — Other Ambulatory Visit (HOSPITAL_COMMUNITY): Payer: Self-pay

## 2023-09-06 ENCOUNTER — Ambulatory Visit: Payer: Medicaid Other | Admitting: Urology

## 2023-09-06 ENCOUNTER — Encounter: Payer: Self-pay | Admitting: Oncology

## 2023-09-06 DIAGNOSIS — R3916 Straining to void: Secondary | ICD-10-CM | POA: Diagnosis not present

## 2023-09-06 DIAGNOSIS — R972 Elevated prostate specific antigen [PSA]: Secondary | ICD-10-CM

## 2023-09-06 DIAGNOSIS — N401 Enlarged prostate with lower urinary tract symptoms: Secondary | ICD-10-CM

## 2023-09-06 LAB — URINALYSIS, COMPLETE
Bilirubin, UA: NEGATIVE
Glucose, UA: NEGATIVE
Ketones, UA: NEGATIVE
Leukocytes,UA: NEGATIVE
Nitrite, UA: NEGATIVE
Protein,UA: NEGATIVE
Specific Gravity, UA: 1.025 (ref 1.005–1.030)
Urobilinogen, Ur: 0.2 mg/dL (ref 0.2–1.0)
pH, UA: 5 (ref 5.0–7.5)

## 2023-09-06 LAB — MICROSCOPIC EXAMINATION
Bacteria, UA: NONE SEEN
Epithelial Cells (non renal): NONE SEEN /HPF (ref 0–10)

## 2023-09-06 NOTE — Progress Notes (Signed)
 09/06/23  Chief Complaint  Patient presents with   Procedure    TRUS     HPI: 63 y.o. year-old male with refractory urinary symptoms related to BPH who presents today to the office for cystoscopy and prostate sizing   Please see previous notes for details.      NED. A&Ox3.   No respiratory distress   Abd soft, NT, ND Normal phallus with bilateral descended testicles    Cystoscopy Procedure Note  Patient identification was confirmed, informed consent was obtained, and patient was prepped using Betadine  solution.  Lidocaine  jelly was administered per urethral meatus.    Preoperative abx where received prior to procedure.     Pre-Procedure: - Inspection reveals a normal caliber ureteral meatus.  Procedure: The flexible cystoscope was introduced without difficulty - No urethral strictures/lesions are present. - Enlarged prostate  - Normal bladder neck - Bilateral ureteral orifices identified - Bladder mucosa  reveals no ulcers, tumors, or lesions - No bladder stones - No trabeculation  Retroflexion unremarkable   Post-Procedure: - Patient tolerated the procedure well   Prostate transrectal ultrasound sizing   Informed consent was obtained after discussing risks/benefits of the procedure.  A time out was performed to ensure correct patient identity.   Pre-Procedure: -Transrectal probe was placed without difficulty -Transrectal Ultrasound performed revealing a 188.5 gm prostate measuring 6.31 x 7.68 x 7.43 cm (length) -No significant hypoechoic or median lobe noted      Assessment/ Plan:  1. Benign prostatic hyperplasia (BPH) with straining on urination (Primary) Massive BPH with severe urinary symptoms  Given the volume of his prostate, options could include open simple prostatectomy, robotic simple prostatectomy, prostatic embolization as well as HoLEP.    He is most interested in HoLEP.  We discussed the common postoperative course following holep  including need for overnight Foley catheter, temporary worsening of irritative voiding symptoms, and occasional stress incontinence which typically lasts up to 6 months but can persist.  We discussed retrograde ejaculation and damage to surrounding structures including the urinary sphincter. Other uncommon complications including hematuria and urinary tract infection.  He understands all of the above and is willing to proceed as planned.  - Urinalysis, Complete - CULTURE, URINE COMPREHENSIVE  2. Elevated PSA Likely appropriate for prostate volume.  Will monitor. - Urinalysis, Complete - CULTURE, URINE COMPREHENSIVE    Dustin Gimenez, MD

## 2023-09-06 NOTE — Patient Instructions (Signed)

## 2023-09-09 LAB — CULTURE, URINE COMPREHENSIVE

## 2023-09-22 ENCOUNTER — Other Ambulatory Visit: Payer: Self-pay

## 2023-09-22 ENCOUNTER — Telehealth: Payer: Self-pay

## 2023-09-22 DIAGNOSIS — N138 Other obstructive and reflux uropathy: Secondary | ICD-10-CM

## 2023-09-22 DIAGNOSIS — N401 Enlarged prostate with lower urinary tract symptoms: Secondary | ICD-10-CM

## 2023-09-22 NOTE — Telephone Encounter (Signed)
 Called patient to schedule surgery. Patient states that he is interested in the procedure that was discussed that includes no down time and is done Intravenously? Im unsure of what he is referring to. He would like for someone to call him and discuss this procedure with him in detail. He said he is not interested in having a Holep at this time.

## 2023-09-22 NOTE — Progress Notes (Signed)
 Surgical Physician Order Form  [facility]  Dr. Dustin Gimenez, MD  * Scheduling expectation : Next Available  *Length of Case:   *Clearance needed: no  *Anticoagulation Instructions: Hold all anticoagulants  *Aspirin  Instructions: Hold Aspirin   *Post-op visit Date/Instructions:  2 days foley removal, 6 weeks IPSS/ PVR  *Diagnosis: BPH w/urinary obstruction  *Procedure:  HOLEP (19147)   Additional orders: N/A  -Admit type: OUTpatient  -Anesthesia: General  -VTE Prophylaxis Standing Order SCD's       Other:   -Standing Lab Orders Per Anesthesia    Lab other: None  -Standing Test orders EKG/Chest x-ray per Anesthesia       Test other:   - Medications:  Ancef  2gm IV  -Other orders:  N/A

## 2023-09-25 NOTE — Telephone Encounter (Signed)
 I think he is referring to UroLift.  Unfortunately, his prostate is 188 g which does not make him a candidate for that procedure.  I am happy to see him again in the office to discuss further.  Dustin Gimenez, MD

## 2023-09-26 ENCOUNTER — Other Ambulatory Visit (HOSPITAL_COMMUNITY): Payer: Self-pay

## 2023-09-26 NOTE — Telephone Encounter (Signed)
 Left message to call back

## 2023-09-27 ENCOUNTER — Other Ambulatory Visit (HOSPITAL_COMMUNITY): Payer: Self-pay

## 2023-09-27 NOTE — Telephone Encounter (Signed)
 Spoke with patient and clarified his message. Patient states Dr Ace Holder discussed different procedures for BPH and one of the ones she mentioned was a procedure that was no down time, same day go home but not one Dr Ace Holder can do and he would need be referred to another office. He does not recall the name of the procedure and is willing to go to another office

## 2023-09-27 NOTE — Telephone Encounter (Signed)
 Spoke with pt. Pt. Is referring to PAE. Advised we can refer him to IR for Consult and they will schedule from there.

## 2023-09-27 NOTE — Telephone Encounter (Signed)
 We talked about HoLEP.  This is same day discharged but cathter for a week.  Maybe he is referring to prostatic artery embolization?  Dustin Gimenez, MD

## 2023-09-28 ENCOUNTER — Other Ambulatory Visit (HOSPITAL_COMMUNITY): Payer: Self-pay

## 2023-10-05 ENCOUNTER — Ambulatory Visit
Admission: RE | Admit: 2023-10-05 | Discharge: 2023-10-05 | Disposition: A | Discharge status: Home / Self Care | Source: Ambulatory Visit | Attending: Urology | Admitting: Urology

## 2023-10-05 ENCOUNTER — Other Ambulatory Visit: Payer: Self-pay | Admitting: Interventional Radiology

## 2023-10-05 ENCOUNTER — Encounter: Payer: Self-pay | Admitting: Oncology

## 2023-10-05 DIAGNOSIS — N401 Enlarged prostate with lower urinary tract symptoms: Secondary | ICD-10-CM

## 2023-10-05 HISTORY — PX: IR RADIOLOGIST EVAL & MGMT: IMG5224

## 2023-10-05 NOTE — Consult Note (Signed)
 Chief Complaint: Patient was seen in consultation today for BPH with LUTS at the request of Brandon,Ashley  Referring Physician(s): Brandon,Ashley  History of Present Illness: Anthony Knight is a 63 y.o. male who presents at the kind request of Dr. Dustin Gimenez to discuss minimally invasive treatment for severe lower urinary tract symptoms. For years he has experienced progressive symptoms including weak stream, urgency and frequency.   IPSS score 21  QoL 6 (Terrible)  He is highly motivated to seek treatment for relief.   Past Medical History:  Diagnosis Date   Cancer [facility])    colon cancer   History of kidney stones    early 2018   Hypertension    Stricture of sigmoid colon [facility])     Past Surgical History:  Procedure Laterality Date   COLONOSCOPY N/A 03/01/2018   Procedure: COLONOSCOPY;  Surgeon: Melvenia Stabs, MD;  Location: [facility] ORS;  Service: General;  Laterality: N/A;   COLONOSCOPY WITH PROPOFOL  N/A 10/19/2022   Procedure: COLONOSCOPY WITH PROPOFOL ;  Surgeon: Conrado Delay, DO;  Location: [facility] ENDOSCOPY;  Service: General;  Laterality: N/A;   CYSTOSCOPY W/ URETERAL STENT PLACEMENT Bilateral 10/10/2018   Procedure: CYSTOSCOPY WITH BILATERAL RETROGRADE PYELOGRAM/URETERAL BILATERAL STENT PLACEMENT AND LASER,RIGHT URETERAL LASER LITHOTRIPSY,LEFT DIAGNOSTIC RETROGRADE;  Surgeon: Osborn Blaze, MD;  Location: [facility];  Service: Urology;  Laterality: Bilateral;   IR RADIOLOGIST EVAL & MGMT  10/05/2023   LAPAROSCOPIC SIGMOID COLECTOMY N/A 05/26/2017   Procedure: LAPAROSCOPIC ASSISTED SIGMOID COLECTOMY;  Surgeon: Caralyn Chandler, MD;  Location: [facility] OR;  Service: General;  Laterality: N/A;  ERAS PATHWAY   PORT-A-CATH REMOVAL N/A 03/01/2018   Procedure: REMOVAL PORT-A-CATH;  Surgeon: Caralyn Chandler, MD;  Location: [facility];  Service: General;  Laterality: N/A;   PORTACATH PLACEMENT Left 06/22/2017   Procedure: INSERTION PORT-A-CATH;  Surgeon: Caralyn Chandler, MD;  Location: [facility];  Service: General;  Laterality: Left;   rectal tear     Per patient report   SIGMOIDOSCOPY N/A 05/26/2017   Procedure: RIGID SIGMOIDOSCOPY;  Surgeon: Caralyn Chandler, MD;  Location: [facility] OR;  Service: General;  Laterality: N/A;   URETERAL STENT PLACEMENT  2002   kidney stones removal as well    Allergies: Patient has no known allergies.  Medications: Prior to Admission medications   Medication Sig Start Date End Date Taking? Authorizing Provider  aspirin  EC 81 MG tablet Take 1 tablet (81 mg total) by mouth daily. 02/18/15   Cook, Jayce G, DO  atorvastatin  (LIPITOR) 10 MG tablet Take 10 mg by mouth daily. 03/10/21   [provider]  atorvastatin  (LIPITOR) 20 MG tablet Take 1 tablet (20 mg total) by mouth daily. 08/17/23     guaiFENesin -codeine  100-10 MG/5ML syrup Take 5 mLs by mouth every 6 (six) hours as needed. 08/14/23     hydrochlorothiazide  (HYDRODIURIL ) 25 MG tablet Take 1 tablet (25 mg total) by mouth daily. Patient not taking: Reported on 09/06/2023 07/19/22     losartan  (COZAAR ) 100 MG tablet Take 1 tablet (100 mg total) by mouth 2 (two) times daily. 05/11/23     predniSONE  (DELTASONE ) 20 MG tablet Take 3 tablets (60 mg total) by mouth once daily for 2 days, THEN 2 tablets (40 mg total) once daily for 2 days, THEN 1 tablet (20 mg total) once daily for 2 days. 08/14/23     tamsulosin  (FLOMAX ) 0.4 MG CAPS capsule Take 1 capsule (0.4 mg total) by mouth daily. Take 30 minutes  after same meal each day. 05/29/23     traZODone  (DESYREL ) 50 MG tablet Take 1 tablet (50 mg total) by mouth at bedtime 03/30/23        Family History  Problem Relation Age of Onset   Cancer Father        prostate   Cancer Maternal Grandfather        lung and colon   Cancer Maternal Uncle        colon   Cancer Maternal Uncle        spleen   Cancer Maternal Uncle     Social History   Socioeconomic History   Marital status: Married    Spouse name: Not on file   Number of children:  Not on file   Years of education: Not on file   Highest education level: Not on file  Occupational History   Not on file  Tobacco Use   Smoking status: Former    Current packs/day: 0.00    Types: Cigarettes    Quit date: 02/17/1985    Years since quitting: 38.6   Smokeless tobacco: Never  Vaping Use   Vaping status: Never Used  Substance and Sexual Activity   Alcohol use: Yes    Alcohol/week: 0.0 - 1.0 standard drinks of alcohol    Comment: very rare   Drug use: No   Sexual activity: Yes    Partners: Female  Other Topics Concern   Not on file  Social History Narrative   Not on file   Social Drivers of Health   Financial Resource Strain: Low Risk  (06/01/2023)   Received from [facility]   Overall Financial Resource Strain (CARDIA)    Difficulty of Paying Living Expenses: Not hard at all  Food Insecurity: No Food Insecurity (06/01/2023)   Received from [facility]   Hunger Vital Sign    Worried About Running Out of Food in the Last Year: Never true    Ran Out of Food in the Last Year: Never true  Transportation Needs: No Transportation Needs (06/01/2023)   Received from [facility] - Transportation    In the past 12 months, has lack of transportation kept you from medical appointments or from getting medications?: No    Lack of Transportation (Non-Medical): No  Physical Activity: Not on file  Stress: Not on file  Social Connections: Not on file   Review of Systems: A 12 point ROS discussed and pertinent positives are indicated in the HPI above.  All other systems are negative.  Review of Systems  Vital Signs: BP (!) 146/80 (BP Location: Left Arm, Patient Position: Sitting)   Pulse 71   Temp 97.8 F (36.6 C) (Oral)   Resp 16   SpO2 96%     Physical Exam Constitutional:      General: He is not in acute distress.    Appearance: Normal appearance. He is normal weight.  HENT:     Head: Normocephalic  and atraumatic.  Eyes:     General: No scleral icterus. Cardiovascular:     Rate and Rhythm: Normal rate.  Pulmonary:     Effort: Pulmonary effort is normal.  Abdominal:     General: There is no distension.     Tenderness: There is no abdominal tenderness.  Skin:    General: Skin is warm and dry.  Neurological:     Mental Status: He is alert and oriented to person, place, and  time.  Psychiatric:        Behavior: Behavior normal.       Imaging: IR Radiologist Eval & Mgmt Result Date: 10/05/2023 EXAM: NEW PATIENT OFFICE VISIT CHIEF COMPLAINT: SEE EPIC NOTE HISTORY OF PRESENT ILLNESS: SEE EPIC NOTE REVIEW OF SYSTEMS: SEE EPIC NOTE PHYSICAL EXAMINATION: SEE EPIC NOTE ASSESSMENT AND PLAN: SEE EPIC NOTE Electronically Signed   By: Fernando Hoyer M.D.   On: 10/05/2023 11:07    Labs:  CBC: No results for input(s): "WBC", "HGB", "HCT", "PLT" in the last 8760 hours.  COAGS: No results for input(s): "INR", "APTT" in the last 8760 hours.  BMP: No results for input(s): "NA", "K", "CL", "CO2", "GLUCOSE", "BUN", "CALCIUM ", "CREATININE", "GFRNONAA", "GFRAA" in the last 8760 hours.  Invalid input(s): "CMP"  LIVER FUNCTION TESTS: No results for input(s): "BILITOT", "AST", "ALT", "ALKPHOS", "PROT", "ALBUMIN" in the last 8760 hours.  TUMOR MARKERS: Recent Labs    07/05/23 0958  CEA 1.38    Assessment and Plan:  63 yo gentleman with benign prostatic hyperplasia and severe lower urinary tract symptoms (IPSS Score 21) which are negatively impacting his quality of life (QoL score of 6, terrible).  He has discussed other treatment strategies with his urologist, Dr. Ace Holder, but is concerned about the downtime and potential complications.   We discussed prostate artery embolization (PAE) in detail including the risks, benefits and alternatives.  He understands and would like to proceed with treatment.   1.) CTA pelvis for pre-procedure planning 2.) Schedule for PAE at [facility]  Thank  you for this interesting consult.  I greatly enjoyed meeting Anthony Knight and look forward to participating in their care.  A copy of this report was sent to the requesting provider on this date.  Electronically Signed: Roxie Cord 10/05/2023, 11:43 AM   I spent a total of  40 Minutes  in face to face in clinical consultation, greater than 50% of which was counseling/coordinating care for BPH with severe LUTS

## 2023-10-06 ENCOUNTER — Ambulatory Visit
Admission: RE | Admit: 2023-10-06 | Discharge: 2023-10-06 | Disposition: A | Discharge status: Home / Self Care | Source: Ambulatory Visit | Attending: Interventional Radiology | Admitting: Interventional Radiology

## 2023-10-06 DIAGNOSIS — N401 Enlarged prostate with lower urinary tract symptoms: Secondary | ICD-10-CM

## 2023-10-06 MED ORDER — IOPAMIDOL (ISOVUE-370) INJECTION 76%
80.0000 mL | Freq: Once | INTRAVENOUS | Status: AC | PRN
Start: 2023-10-06 — End: 2023-10-06
  Administered 2023-10-06: 80 mL via INTRAVENOUS

## 2023-10-11 NOTE — Progress Notes (Signed)
 Patient for IR PAE on Tues 10/17/23, I called and spoke with the patient on the phone and gave pre-procedure instructions. Pt was made aware to be here at 8:30a, last dose of ASA 81mg  was Wed 10/11/23, NPO after MN prior to procedure as well as driver post procedure/recovery/discharge. Pt stated understanding.  Called 10/11/23

## 2023-10-12 ENCOUNTER — Other Ambulatory Visit: Payer: Self-pay | Admitting: Radiology

## 2023-10-12 DIAGNOSIS — N401 Enlarged prostate with lower urinary tract symptoms: Secondary | ICD-10-CM

## 2023-10-13 ENCOUNTER — Encounter: Payer: Self-pay | Admitting: Radiology

## 2023-10-13 NOTE — H&P (Signed)
 Chief Complaint: History of Benign prostatic hyperplasia with sever lower urinary tract symptoms. . Patient presents for Prostate artery embolization   Referring Physician(s): Dr. Dustin Gimenez  Supervising Physician: Fernando Hoyer  Patient Status: [facility] - Out-pt  History of Present Illness: Anthony Knight is a 63 y.o. male outpatient. History of HTN, kidney stones s/p bilateral double j stents, adenocarcinoma of the sigmoid colon,  s/p colectomy, chemotherapy , BPH. Patient endorses persistent and worsening weak stream, urgency and frequency. The patient was seen for consultation in the Interventional Radiology Clinic  on 5.15.25 with IR Attending Dr. Fernando Hoyer. At that time a detailed discussion regarding the Patient's medical condition including but not limited to possible treatment options took place. Following that discussion the Patient and  elected to proceed with prostate artery embolization. The Patient presents today for prostate artery embolization.  Currently without any significant complaints. Patient alert and laying in bed,calm. Denies any fevers, headache, chest pain, SOB, cough, abdominal pain, nausea, vomiting or bleeding.   All labs are within acceptable parameters. Patient is on 81 mg of ASA. Last dose taken approximately 1 week ago.  All other medications are within acceptable parameters. (IPSS Score 21) which are negatively impacting his quality of life (QoL score of 6, terrible).   Return precautions and treatment recommendations and follow-up discussed with the patient  who is agreeable with the plan.    Past Medical History:  Diagnosis Date   Cancer [facility])    colon cancer   History of kidney stones    early 2018   Hypertension    Stricture of sigmoid colon [facility])     Past Surgical History:  Procedure Laterality Date   COLONOSCOPY N/A 03/01/2018   Procedure: COLONOSCOPY;  Surgeon: Melvenia Stabs, MD;  Location: [facility]S;  Service: General;   Laterality: N/A;   COLONOSCOPY WITH PROPOFOL  N/A 10/19/2022   Procedure: COLONOSCOPY WITH PROPOFOL ;  Surgeon: Conrado Delay, DO;  Location: [facility] ENDOSCOPY;  Service: General;  Laterality: N/A;   CYSTOSCOPY W/ URETERAL STENT PLACEMENT Bilateral 10/10/2018   Procedure: CYSTOSCOPY WITH BILATERAL RETROGRADE PYELOGRAM/URETERAL BILATERAL STENT PLACEMENT AND LASER,RIGHT URETERAL LASER LITHOTRIPSY,LEFT DIAGNOSTIC RETROGRADE;  Surgeon: Osborn Blaze, MD;  Location: [facility];  Service: Urology;  Laterality: Bilateral;   IR RADIOLOGIST EVAL & MGMT  10/05/2023   LAPAROSCOPIC SIGMOID COLECTOMY N/A 05/26/2017   Procedure: LAPAROSCOPIC ASSISTED SIGMOID COLECTOMY;  Surgeon: Caralyn Chandler, MD;  Location: [facility] OR;  Service: General;  Laterality: N/A;  ERAS PATHWAY   PORT-A-CATH REMOVAL N/A 03/01/2018   Procedure: REMOVAL PORT-A-CATH;  Surgeon: Caralyn Chandler, MD;  Location: [facility];  Service: General;  Laterality: N/A;   PORTACATH PLACEMENT Left 06/22/2017   Procedure: INSERTION PORT-A-CATH;  Surgeon: Caralyn Chandler, MD;  Location: [facility];  Service: General;  Laterality: Left;   rectal tear     Per patient report   SIGMOIDOSCOPY N/A 05/26/2017   Procedure: RIGID SIGMOIDOSCOPY;  Surgeon: Caralyn Chandler, MD;  Location: [facility] OR;  Service: General;  Laterality: N/A;   URETERAL STENT PLACEMENT  2002   kidney stones removal as well    Allergies: Patient has no known allergies.  Medications: Prior to Admission medications   Medication Sig Start Date End Date Taking? Authorizing Provider  aspirin  EC 81 MG tablet Take 1 tablet (81 mg total) by mouth daily. 02/18/15   Cook, Jayce G, DO  atorvastatin  (LIPITOR) 10 MG tablet Take 10 mg by mouth daily. 03/10/21   [provider]  atorvastatin  (LIPITOR) 20 MG tablet Take 1 tablet (20 mg total) by mouth daily. 08/17/23     guaiFENesin -codeine  100-10 MG/5ML syrup Take 5 mLs by mouth every 6 (six) hours as needed. 08/14/23     hydrochlorothiazide  (HYDRODIURIL )  25 MG tablet Take 1 tablet (25 mg total) by mouth daily. Patient not taking: Reported on 09/06/2023 07/19/22     losartan  (COZAAR ) 100 MG tablet Take 1 tablet (100 mg total) by mouth 2 (two) times daily. 05/11/23     predniSONE  (DELTASONE ) 20 MG tablet Take 3 tablets (60 mg total) by mouth once daily for 2 days, THEN 2 tablets (40 mg total) once daily for 2 days, THEN 1 tablet (20 mg total) once daily for 2 days. 08/14/23     tamsulosin  (FLOMAX ) 0.4 MG CAPS capsule Take 1 capsule (0.4 mg total) by mouth daily. Take 30 minutes after same meal each day. 05/29/23     traZODone  (DESYREL ) 50 MG tablet Take 1 tablet (50 mg total) by mouth at bedtime 03/30/23        Family History  Problem Relation Age of Onset   Cancer Father        prostate   Cancer Maternal Grandfather        lung and colon   Cancer Maternal Uncle        colon   Cancer Maternal Uncle        spleen   Cancer Maternal Uncle     Social History   Socioeconomic History   Marital status: Married    Spouse name: Not on file   Number of children: Not on file   Years of education: Not on file   Highest education level: Not on file  Occupational History   Not on file  Tobacco Use   Smoking status: Former    Current packs/day: 0.00    Types: Cigarettes    Quit date: 02/17/1985    Years since quitting: 38.6   Smokeless tobacco: Never  Vaping Use   Vaping status: Never Used  Substance and Sexual Activity   Alcohol use: Yes    Alcohol/week: 0.0 - 1.0 standard drinks of alcohol    Comment: very rare   Drug use: No   Sexual activity: Yes    Partners: Female  Other Topics Concern   Not on file  Social History Narrative   Not on file   Social Drivers of Health   Financial Resource Strain: Low Risk  (06/01/2023)   [facility]   Overall Financial Resource Strain (CARDIA)    Difficulty of Paying Living Expenses: Not hard at all  Food Insecurity: No Food Insecurity (06/01/2023)   Received from  [facility]   Hunger Vital Sign    Worried About Running Out of Food in the Last Year: Never true    Ran Out of Food in the Last Year: Never true  Transportation Needs: No Transportation Needs (06/01/2023)   Received from [facility] - Transportation    In the past 12 months, has lack of transportation kept you from medical appointments or from getting medications?: No    Lack of Transportation (Non-Medical): No  Physical Activity: Not on file  Stress: Not on file  Social Connections: Not on file     Review of Systems: A 12 point ROS discussed and pertinent positives are indicated in the HPI above.  All other systems are negative.  Review of  Systems  Constitutional:  Negative for fever.  HENT:  Negative for congestion.   Respiratory:  Negative for cough and shortness of breath.   Cardiovascular:  Negative for chest pain.  Gastrointestinal:  Negative for abdominal pain.  Neurological:  Negative for headaches.  Psychiatric/Behavioral:  Negative for behavioral problems and confusion.     Vital Signs: BP (!) 152/87   Pulse 61   Temp 97.7 F (36.5 C) (Oral)   Resp 16   SpO2 94%     Physical Exam Vitals and nursing note reviewed.  Constitutional:      Appearance: He is well-developed.  HENT:     Head: Normocephalic.     Mouth/Throat:     Mouth: Mucous membranes are dry.  Cardiovascular:     Rate and Rhythm: Normal rate and regular rhythm.  Pulmonary:     Effort: Pulmonary effort is normal.  Musculoskeletal:        General: Normal range of motion.     Cervical back: Normal range of motion.  Skin:    General: Skin is dry.  Neurological:     General: No focal deficit present.     Mental Status: He is alert and oriented to person, place, and time. Mental status is at baseline.  Psychiatric:        Mood and Affect: Mood normal.        Behavior: Behavior normal.        Thought Content: Thought content normal.         Judgment: Judgment normal.     Imaging: CT ANGIO PELVIS W OR WO CONTRAST Result Date: 10/08/2023 CLINICAL DATA:  Benign prostatic hyperplasia with lower urinary tract symptoms EXAM: CT ANGIOGRAPHY OF PELVIS TECHNIQUE: Multidetector CT imaging of the pelvis was performed using the standard protocol during bolus administration of intravenous contrast. Multiplanar reconstructed images and MIPs were obtained and reviewed to evaluate the vascular anatomy. RADIATION DOSE REDUCTION: This exam was performed according to the departmental dose-optimization program which includes automated exposure control, adjustment of the mA and/or kV according to patient size and/or use of iterative reconstruction technique. CONTRAST:  80mL ISOVUE -370 IOPAMIDOL  (ISOVUE -370) INJECTION 76% COMPARISON:  Prior CTA of the abdomen and pelvis 06/23/2020 FINDINGS: VASCULAR Aorta: The visualized distal aorta is normal in caliber. No evidence of aneurysm or dissection. Faint calcified atherosclerotic plaque. IMA: Unremarkable appearance of the visualized portion of the inferior mesenteric artery. Inflow: Iliac arteries are mildly tortuous. No significant stenosis at the origin of either internal iliac artery. Bilateral aberrant obturator arteries arising from the inferior epigastric arteries. On the right, the prostatic artery appears to arise proximally from the common gluteal pudendal trunk. The prostatic artery anatomy on the left is less clear. The prostatic artery may be replaced to the aberrant obturator artery. Of note, the anterior division provide supply to a superior vesicular artery, small inferior gluteal artery and a relatively large middle rectal artery. The pudendal artery is not well seen. There may be some prostatic branches arising from the superior vesicular artery. Proximal Outflow: Bilateral common femoral and visualized portions of the superficial and profunda femoral arteries are patent without evidence of aneurysm,  dissection, vasculitis or significant stenosis. Veins: No focal venous abnormality. Review of the MIP images confirms the above findings. NON-VASCULAR Urinary Tract: Thick walled bladder consistent with chronic bladder outlet obstruction. Stomach/Bowel: Surgical changes of prior sigmoid colectomy without evidence of complication. No focal bowel wall thickening or evidence of obstruction. Normal appendix. Lymphatic: No suspicious lymphadenopathy. Reproductive: Marked  prostatomegaly consistent with known history of benign prostatic hyperplasia. The prostate gland measures approximately 7.5 x 6.1 x 7.8 cm (187 g). Other: No abdominal wall hernia or abnormality. No abdominopelvic ascites. Musculoskeletal: No acute fracture or aggressive appearing lytic or blastic osseous lesion. IMPRESSION: VASCULAR 1. Mildly tortuous iliac arteries with minimal calcified atherosclerotic plaque and no significant stenosis, occlusion, dissection or aneurysm. 2. Bilateral aberrant obturator arteries arising from the inferior epigastric arteries. 3. On the right, the prostatic artery appears to arise from its classical location at the proximal gluteal pudendal trunk. 4. On the left, the anatomy is less clear but it appears that the prostatic artery is replaced to the aberrant obturator artery. There may also be some prostatic branches arising from the superior vesicular artery. 5. On the left, there is a large middle rectal artery arising distally from the gluteal pudendal trunk. NON-VASCULAR 1. Prostatomegaly.  The prostate gland is estimated at 187 g. 2. No acute abnormality within the pelvis. Electronically Signed   By: Fernando Hoyer M.D.   On: 10/08/2023 07:58   IR Radiologist Eval & Mgmt Result Date: 10/05/2023 EXAM: NEW PATIENT OFFICE VISIT CHIEF COMPLAINT: SEE EPIC NOTE HISTORY OF PRESENT ILLNESS: SEE EPIC NOTE REVIEW OF SYSTEMS: SEE EPIC NOTE PHYSICAL EXAMINATION: SEE EPIC NOTE ASSESSMENT AND PLAN: SEE EPIC NOTE  Electronically Signed   By: Fernando Hoyer M.D.   On: 10/05/2023 11:07    Labs:  CBC: Recent Labs    10/17/23 0834  WBC 8.2  HGB 15.6  HCT 44.8  PLT 279    COAGS: No results for input(s): "INR", "APTT" in the last 8760 hours.  BMP: No results for input(s): "NA", "K", "CL", "CO2", "GLUCOSE", "BUN", "CALCIUM ", "CREATININE", "GFRNONAA", "GFRAA" in the last 8760 hours.  Invalid input(s): "CMP"  LIVER FUNCTION TESTS: No results for input(s): "BILITOT", "AST", "ALT", "ALKPHOS", "PROT", "ALBUMIN" in the last 8760 hours.  TUMOR MARKERS: Recent Labs    07/05/23 0958  CEA 1.38    Assessment and Plan:  63 y.o. male outpatient. History of HTN, kidney stones s/p bilateral double j stents, adenocarcinoma of the sigmoid colon,  s/p colectomy, chemotherapy , BPH. Patient endorses persistent and worsening weak stream, urgency and frequency. The patient was seen for consultation in the Interventional Radiology Clinic  on 5.15.25 with IR Attending Dr. Fernando Hoyer. At that time a detailed discussion regarding the Patient's medical condition including but not limited to possible treatment options took place. Following that discussion the Patient and  elected to proceed with prostate artery embolization. The Patient presents today for prostate artery embolization.   PLAN: IR Image Guided Prostate Artery Embolization  The Risks and benefits of embolization were discussed with the patient including, but not limited to bleeding, infection, vascular injury, post operative pain, or contrast induced renal failure.  This procedure involves the use of X-rays and because of the nature of the planned procedure, it is possible that we will have prolonged use of X-ray fluoroscopy.  Potential radiation risks to you include (but are not limited to) the following: - A slightly elevated risk for cancer several years later in life. This risk is typically less than 0.5% percent. This risk is low in  comparison to the normal incidence of human cancer, which is 33% for women and 50% for men according to the [employer]. - Radiation induced injury can include skin redness, resembling a rash, tissue breakdown / ulcers and hair loss (which can be temporary or permanent).   The  likelihood of either of these occurring depends on the difficulty of the procedure and whether you are sensitive to radiation due to previous procedures, disease, or genetic conditions.   IF your procedure requires a prolonged use of radiation, you will be notified and given written instructions for further action.  It is your responsibility to monitor the irradiated area for the 2 weeks following the procedure and to notify your physician if you are concerned that you have suffered a radiation induced injury.    All of the patient's questions were answered, patient is agreeable to proceed. Consent signed and in chart.     Thank you for this interesting consult.  I greatly enjoyed meeting JAMONE GARRIDO and look forward to participating in their care.  A copy of this report was sent to the requesting provider on this date.  Electronically Signed: Marceil Sensor, NP 10/17/2023, 8:45 AM   I spent a total of    25 Minutes in face to face in clinical consultation, greater than 50% of which was counseling/coordinating care for Prostate artery embolization

## 2023-10-17 ENCOUNTER — Encounter: Payer: Self-pay | Admitting: Oncology

## 2023-10-17 ENCOUNTER — Other Ambulatory Visit: Payer: Self-pay

## 2023-10-17 ENCOUNTER — Other Ambulatory Visit (HOSPITAL_COMMUNITY): Payer: Self-pay

## 2023-10-17 ENCOUNTER — Other Ambulatory Visit (HOSPITAL_COMMUNITY): Payer: Self-pay | Admitting: Radiology

## 2023-10-17 ENCOUNTER — Encounter: Payer: Self-pay | Admitting: Radiology

## 2023-10-17 ENCOUNTER — Ambulatory Visit
Admission: RE | Admit: 2023-10-17 | Discharge: 2023-10-17 | Disposition: A | Discharge status: Home / Self Care | Source: Ambulatory Visit | Attending: Interventional Radiology | Admitting: Interventional Radiology

## 2023-10-17 DIAGNOSIS — N401 Enlarged prostate with lower urinary tract symptoms: Secondary | ICD-10-CM

## 2023-10-17 DIAGNOSIS — Z87891 Personal history of nicotine dependence: Secondary | ICD-10-CM | POA: Insufficient documentation

## 2023-10-17 DIAGNOSIS — I10 Essential (primary) hypertension: Secondary | ICD-10-CM | POA: Diagnosis not present

## 2023-10-17 DIAGNOSIS — Z85038 Personal history of other malignant neoplasm of large intestine: Secondary | ICD-10-CM | POA: Insufficient documentation

## 2023-10-17 DIAGNOSIS — Z9221 Personal history of antineoplastic chemotherapy: Secondary | ICD-10-CM | POA: Diagnosis not present

## 2023-10-17 DIAGNOSIS — Z7982 Long term (current) use of aspirin: Secondary | ICD-10-CM | POA: Diagnosis not present

## 2023-10-17 DIAGNOSIS — Z79899 Other long term (current) drug therapy: Secondary | ICD-10-CM | POA: Insufficient documentation

## 2023-10-17 DIAGNOSIS — R3912 Poor urinary stream: Secondary | ICD-10-CM | POA: Diagnosis not present

## 2023-10-17 HISTORY — PX: IR EMBO ARTERIAL NOT HEMORR HEMANG INC GUIDE ROADMAPPING: IMG5448

## 2023-10-17 LAB — PROTIME-INR
INR: 1 (ref 0.8–1.2)
Prothrombin Time: 13 s (ref 11.4–15.2)

## 2023-10-17 LAB — CBC
HCT: 44.8 % (ref 39.0–52.0)
Hemoglobin: 15.6 g/dL (ref 13.0–17.0)
MCH: 29.8 pg (ref 26.0–34.0)
MCHC: 34.8 g/dL (ref 30.0–36.0)
MCV: 85.7 fL (ref 80.0–100.0)
Platelets: 279 10*3/uL (ref 150–400)
RBC: 5.23 MIL/uL (ref 4.22–5.81)
RDW: 13.5 % (ref 11.5–15.5)
WBC: 8.2 10*3/uL (ref 4.0–10.5)
nRBC: 0 % (ref 0.0–0.2)

## 2023-10-17 LAB — BASIC METABOLIC PANEL WITH GFR
Anion gap: 8 (ref 5–15)
BUN: 18 mg/dL (ref 8–23)
CO2: 24 mmol/L (ref 22–32)
Calcium: 9.2 mg/dL (ref 8.9–10.3)
Chloride: 107 mmol/L (ref 98–111)
Creatinine, Ser: 1.36 mg/dL — ABNORMAL HIGH (ref 0.61–1.24)
GFR, Estimated: 59 mL/min — ABNORMAL LOW (ref >60)
Glucose, Bld: 132 mg/dL — ABNORMAL HIGH (ref 70–99)
Potassium: 4.3 mmol/L (ref 3.5–5.1)
Sodium: 139 mmol/L (ref 135–145)

## 2023-10-17 MED ORDER — DIPHENHYDRAMINE HCL 50 MG/ML IJ SOLN
INTRAMUSCULAR | Status: AC | PRN
Start: 2023-10-17 — End: 2023-10-17
  Administered 2023-10-17: 25 mg via INTRAVENOUS

## 2023-10-17 MED ORDER — SODIUM CHLORIDE 0.9 % IV SOLN: INTRAVENOUS | Status: DC

## 2023-10-17 MED ORDER — DIPHENHYDRAMINE HCL 50 MG/ML IJ SOLN
INTRAMUSCULAR | Status: AC
Start: 2023-10-17 — End: ?
  Filled 2023-10-17: qty 1

## 2023-10-17 MED ORDER — SOLIFENACIN SUCCINATE 5 MG PO TABS
5.0000 mg | ORAL_TABLET | Freq: Every day | ORAL | 0 refills | Status: AC
  Filled 2023-10-17: qty 7, 7d supply, fill #0

## 2023-10-17 MED ORDER — FENTANYL CITRATE (PF) 100 MCG/2ML IJ SOLN
INTRAMUSCULAR | Status: AC
  Filled 2023-10-17: qty 2

## 2023-10-17 MED ORDER — FENTANYL CITRATE (PF) 100 MCG/2ML IJ SOLN
INTRAMUSCULAR | Status: AC | PRN
  Administered 2023-10-17: 50 ug via INTRAVENOUS
  Administered 2023-10-17 (×6): 25 ug via INTRAVENOUS

## 2023-10-17 MED ORDER — CIPROFLOXACIN HCL 500 MG PO TABS
500.0000 mg | ORAL_TABLET | Freq: Two times a day (BID) | ORAL | 0 refills | Status: AC
  Filled 2023-10-17: qty 14, 7d supply, fill #0

## 2023-10-17 MED ORDER — LEVOFLOXACIN IN D5W 500 MG/100ML IV SOLN
500.0000 mg | INTRAVENOUS | Status: AC
  Administered 2023-10-17: 500 mg via INTRAVENOUS
  Filled 2023-10-17: qty 100

## 2023-10-17 MED ORDER — METHYLPREDNISOLONE 4 MG PO TBPK
ORAL_TABLET | ORAL | 0 refills | Status: AC
  Filled 2023-10-17: qty 21, 6d supply, fill #0

## 2023-10-17 MED ORDER — MIDAZOLAM HCL 2 MG/2ML IJ SOLN
INTRAMUSCULAR | Status: AC
  Filled 2023-10-17: qty 2

## 2023-10-17 MED ORDER — LIDOCAINE HCL 1 % IJ SOLN
INTRAMUSCULAR | Status: AC
Start: 2023-10-17 — End: ?
  Filled 2023-10-17: qty 20

## 2023-10-17 MED ORDER — PHENAZOPYRIDINE HCL 100 MG PO TABS
100.0000 mg | ORAL_TABLET | Freq: Three times a day (TID) | ORAL | 0 refills | Status: DC | PRN
  Filled 2023-10-17: qty 10, 4d supply, fill #0

## 2023-10-17 MED ORDER — NITROGLYCERIN 1 MG/10 ML FOR IR/CATH LAB
INTRA_ARTERIAL | Status: AC | PRN
  Administered 2023-10-17 (×2): 100 ug via INTRA_ARTERIAL

## 2023-10-17 MED ORDER — LIDOCAINE HCL 1 % IJ SOLN
5.0000 mL | Freq: Once | INTRAMUSCULAR | Status: AC
  Administered 2023-10-17: 5 mL via INTRADERMAL

## 2023-10-17 MED ORDER — IBUPROFEN 800 MG PO TABS
800.0000 mg | ORAL_TABLET | Freq: Three times a day (TID) | ORAL | 0 refills | Status: AC | PRN
  Filled 2023-10-17: qty 21, 7d supply, fill #0

## 2023-10-17 MED ORDER — MIDAZOLAM HCL 2 MG/2ML IJ SOLN
INTRAMUSCULAR | Status: AC
  Filled 2023-10-17: qty 4

## 2023-10-17 MED ORDER — IOHEXOL 300 MG/ML  SOLN
100.0000 mL | Freq: Once | INTRAMUSCULAR | Status: AC | PRN
  Administered 2023-10-17: 100 mL via INTRA_ARTERIAL

## 2023-10-17 MED ORDER — BISACODYL EC 5 MG PO TBEC
5.0000 mg | DELAYED_RELEASE_TABLET | Freq: Every day | ORAL | 0 refills | Status: AC | PRN
  Filled 2023-10-17: qty 30, 30d supply, fill #0

## 2023-10-17 MED ORDER — MIDAZOLAM HCL 2 MG/2ML IJ SOLN
INTRAMUSCULAR | Status: AC | PRN
Start: 2023-10-17 — End: 2023-10-17
  Administered 2023-10-17 (×4): .5 mg via INTRAVENOUS
  Administered 2023-10-17: 2 mg via INTRAVENOUS
  Administered 2023-10-17 (×3): .5 mg via INTRAVENOUS

## 2023-10-17 NOTE — Procedures (Signed)
 Interventional Radiology Procedure Note  Procedure: Successful bilateral PAE from right femoral approach.  Complications: None  Estimated Blood Loss: None  Recommendations: - Bedrest x 1 hr - DC foley now, pt must void on his own prior to DC - F/U in 2-3 weeks   Signed,  Roxie Cord, MD

## 2023-10-20 ENCOUNTER — Encounter: Payer: Self-pay | Admitting: Oncology

## 2023-10-20 ENCOUNTER — Other Ambulatory Visit (HOSPITAL_COMMUNITY): Payer: Self-pay

## 2023-10-20 ENCOUNTER — Other Ambulatory Visit (HOSPITAL_COMMUNITY): Payer: Self-pay | Admitting: Radiology

## 2023-10-20 ENCOUNTER — Other Ambulatory Visit: Payer: Self-pay

## 2023-10-20 MED ORDER — PHENAZOPYRIDINE HCL 100 MG PO TABS
100.0000 mg | ORAL_TABLET | Freq: Three times a day (TID) | ORAL | 0 refills | Status: AC | PRN
  Filled 2023-10-20: qty 10, 4d supply, fill #0

## 2023-11-21 ENCOUNTER — Ambulatory Visit
Admission: RE | Admit: 2023-11-21 | Discharge: 2023-11-21 | Disposition: A | Discharge status: Home / Self Care | Source: Ambulatory Visit | Attending: Radiology | Admitting: Radiology

## 2023-11-21 DIAGNOSIS — N401 Enlarged prostate with lower urinary tract symptoms: Secondary | ICD-10-CM

## 2023-11-21 HISTORY — PX: IR RADIOLOGIST EVAL & MGMT: IMG5224

## 2023-11-21 NOTE — Progress Notes (Signed)
 Chief Complaint: Patient was consulted remotely today (TeleHealth) for BPH with LUTS at the request of Omohundro,Jennifer C.    Referring Physician(s): Omohundro,Jennifer C  History of Present Illness: Anthony Knight is a 63 y.o. male with a history of BPH and severe LUTS who I initially saw on 10/05/23.  At that time his IPSS was 21 and his QoL score was 6 (terrible).    Today, his IPSS has improved to 2 and his QoL score is now 0 (delighted).  This is an amazing result and his is very happy on this front.   Unfortunately, he has had some other significant issues related to the procedure.  He is concerned that he suffered an injury related to the placement of the foley catheter.  He reports that the day after the procedure he had significant bruising along his penis as well as a white dot near the urethral meatus that  was very painful.  Finally, while his urinary stream has greatly improved, he now notes that his stream goes leftward at the beginning of urination until the force of the stream is such that it straightens and then it returns leftward as the stream ends.   The tender white spot is healing and is much better and his bruising has resolved but the leftward deviation of his stream remains problematic.     Past Medical History:  Diagnosis Date   Cancer [facility])    colon cancer   History of kidney stones    early 2018   Hypertension    Stricture of sigmoid colon [facility])     Past Surgical History:  Procedure Laterality Date   COLONOSCOPY N/A 03/01/2018   Procedure: COLONOSCOPY;  Surgeon: Teresa Lonni HERO, MD;  [facility];  Service: General;  Laterality: N/A;   COLONOSCOPY WITH PROPOFOL  N/A 10/19/2022   Procedure: COLONOSCOPY WITH PROPOFOL ;  Surgeon: Tye Millet, DO;  Location: [facility];  Service: General;  Laterality: N/A;   CYSTOSCOPY W/ URETERAL STENT PLACEMENT Bilateral 10/10/2018   Procedure: CYSTOSCOPY WITH BILATERAL RETROGRADE PYELOGRAM/URETERAL  BILATERAL STENT PLACEMENT AND LASER,RIGHT URETERAL LASER LITHOTRIPSY,LEFT DIAGNOSTIC RETROGRADE;  Surgeon: Alvaro Hummer, MD;  [facility];  Service: Urology;  Laterality: Bilateral;   IR EMBO ARTERIAL NOT HEMORR HEMANG INC GUIDE ROADMAPPING  10/17/2023   IR RADIOLOGIST EVAL & MGMT  10/05/2023   LAPAROSCOPIC SIGMOID COLECTOMY N/A 05/26/2017   Procedure: LAPAROSCOPIC ASSISTED SIGMOID COLECTOMY;  Surgeon: Curvin Deward MOULD, MD;  [facility];  Service: General;  Laterality: N/A;  ERAS PATHWAY   PORT-A-CATH REMOVAL N/A 03/01/2018   Procedure: REMOVAL PORT-A-CATH;  Surgeon: Curvin Deward MOULD, MD;  [facility];  Service: General;  Laterality: N/A;   PORTACATH PLACEMENT Left 06/22/2017   Procedure: INSERTION PORT-A-CATH;  Surgeon: Curvin Deward MOULD, MD;  Location: [facility];  Service: General;  Laterality: Left;   rectal tear     Per patient report   SIGMOIDOSCOPY N/A 05/26/2017   Procedure: RIGID SIGMOIDOSCOPY;  Surgeon: Curvin Deward MOULD, MD;  [facility];  Service: General;  Laterality: N/A;   URETERAL STENT PLACEMENT  2002   kidney stones removal as well    Allergies: Patient has no known allergies.  Medications: Prior to Admission medications   Medication Sig Start Date End Date Taking? Authorizing Provider  aspirin  EC 81 MG tablet Take 1 tablet (81 mg total) by mouth daily. 02/18/15   Cook, Jayce G, DO  atorvastatin  (LIPITOR) 10 MG tablet Take 10 mg by mouth  daily. 03/10/21   [provider]  atorvastatin  (LIPITOR) 20 MG tablet Take 1 tablet (20 mg total) by mouth daily. 08/17/23     bisacodyl  5 MG EC tablet Take 1 tablet (5 mg total) by mouth daily as needed for moderate constipation. 10/17/23   Lucila Delon BROCKS, NP  guaiFENesin -codeine  100-10 MG/5ML syrup Take 5 mLs by mouth every 6 (six) hours as needed. 08/14/23     hydrochlorothiazide  (HYDRODIURIL ) 25 MG tablet Take 1 tablet (25 mg total) by mouth daily. Patient not taking: Reported on 09/06/2023 07/19/22      losartan  (COZAAR ) 100 MG tablet Take 1 tablet (100 mg total) by mouth 2 (two) times daily. 05/11/23     methylPREDNISolone  (MEDROL  DOSEPAK) 4 MG TBPK tablet Take as directed. 10/17/23   Lucila Delon BROCKS, NP  predniSONE  (DELTASONE ) 20 MG tablet Take 3 tablets (60 mg total) by mouth once daily for 2 days, THEN 2 tablets (40 mg total) once daily for 2 days, THEN 1 tablet (20 mg total) once daily for 2 days. 08/14/23     tamsulosin  (FLOMAX ) 0.4 MG CAPS capsule Take 1 capsule (0.4 mg total) by mouth daily. Take 30 minutes after same meal each day. 05/29/23     traZODone  (DESYREL ) 50 MG tablet Take 1 tablet (50 mg total) by mouth at bedtime 03/30/23        Family History  Problem Relation Age of Onset   Cancer Father        prostate   Cancer Maternal Grandfather        lung and colon   Cancer Maternal Uncle        colon   Cancer Maternal Uncle        spleen   Cancer Maternal Uncle     Social History   Socioeconomic History   Marital status: Married    Spouse name: Not on file   Number of children: Not on file   Years of education: Not on file   Highest education level: Not on file  Occupational History   Not on file  Tobacco Use   Smoking status: Former    Current packs/day: 0.00    Types: Cigarettes    Quit date: 02/17/1985    Years since quitting: 38.7   Smokeless tobacco: Never  Vaping Use   Vaping status: Never Used  Substance and Sexual Activity   Alcohol use: Yes    Alcohol/week: 0.0 - 1.0 standard drinks of alcohol    Comment: very rare   Drug use: No   Sexual activity: Yes    Partners: Female  Other Topics Concern   Not on file  Social History Narrative   Not on file   Social Drivers of Health   Financial Resource Strain: Low Risk  (06/01/2023)   [facility]   Overall Financial Resource Strain (CARDIA)    Difficulty of Paying Living Expenses: Not hard at all  Food Insecurity: No Food Insecurity (06/01/2023)   Received from [facility]   Hunger Vital Sign    Within the past 12 months, you worried that your food would run out before you got the money to buy more.: Never true    Within the past 12 months, the food you bought just didn't last and you didn't have money to get more.: Never true  Transportation Needs: No Transportation Needs (06/01/2023)   Received from [facility]   [facility] - Transportation  In the past 12 months, has lack of transportation kept you from medical appointments or from getting medications?: No    Lack of Transportation (Non-Medical): No  Physical Activity: Not on file  Stress: Not on file  Social Connections: Not on file   Review of Systems  Review of Systems: A 12 point ROS discussed and pertinent positives are indicated in the HPI above.  All other systems are negative.    Physical Exam No direct physical exam was performed (except for noted visual exam findings with Video Visits).    Vital Signs: There were no vitals taken for this visit.  Imaging: No results found.  Labs:  CBC: Recent Labs    10/17/23 0834  WBC 8.2  HGB 15.6  HCT 44.8  PLT 279    COAGS: Recent Labs    10/17/23 0834  INR 1.0    BMP: Recent Labs    10/17/23 0834  NA 139  K 4.3  CL 107  CO2 24  GLUCOSE 132*  BUN 18  CALCIUM  9.2  CREATININE 1.36*  GFRNONAA 59*    LIVER FUNCTION TESTS: No results for input(s): BILITOT, AST, ALT, ALKPHOS, PROT, ALBUMIN in the last 8760 hours.  TUMOR MARKERS: Recent Labs    07/05/23 0958  CEA 1.38    Assessment and Plan:  63 year-old gentleman now 1 month status post PAE.  While his LUTS have greatly improved, it seems that he may have suffered a traumatic urethral injury from his Foley catheter insertion.  He had significant bruising of his penis the day after the procedure and now his stream is asymmetric toward the left at the beginning and end of his urination.     IPSS has decreased to 2.   He is very satisfied with that result, and if we can straighten out out his urethral issue he feels he will be very happy.  I will refer him back to [facility] to see Dr. Francisca to evaluate for scarring or stricture from the foley placement.  1.) Please reach out to [facility] in [place] for them to schedule Anthony Knight to come in to be assessed for urethral injury/stricture post foley placement at the time of his PAE.   2.) Next f/u with me in 3 months     Electronically Signed: Wilkie MARLA Lent 11/21/2023, 11:17 AM   I spent a total of  15 Minutes in remote  clinical consultation, greater than 50% of which was counseling/coordinating care for BPH with LUTS now post PAE.    Visit type: Audio only (telephone). Audio (no video) only due to patient able to make in-person appointment today. Alternative for in-person consultation at [facility], [address] [place], [place], [place]. This visit type was conducted due to national recommendations for restrictions regarding the COVID-19 Pandemic (e.g. social distancing).  This format is felt to be most appropriate for this patient at this time.  All issues noted in this document were discussed and addressed.

## 2023-12-13 ENCOUNTER — Ambulatory Visit: Admitting: Urology

## 2023-12-19 ENCOUNTER — Other Ambulatory Visit: Payer: Self-pay

## 2023-12-19 ENCOUNTER — Other Ambulatory Visit (HOSPITAL_COMMUNITY): Payer: Self-pay

## 2023-12-20 ENCOUNTER — Other Ambulatory Visit (HOSPITAL_COMMUNITY): Payer: Self-pay

## 2023-12-21 ENCOUNTER — Other Ambulatory Visit (HOSPITAL_COMMUNITY): Payer: Self-pay

## 2024-02-07 ENCOUNTER — Other Ambulatory Visit: Payer: Self-pay | Admitting: Physical Medicine & Rehabilitation

## 2024-02-07 DIAGNOSIS — M5412 Radiculopathy, cervical region: Secondary | ICD-10-CM

## 2024-02-07 DIAGNOSIS — I889 Nonspecific lymphadenitis, unspecified: Secondary | ICD-10-CM

## 2024-02-16 ENCOUNTER — Other Ambulatory Visit (HOSPITAL_COMMUNITY): Payer: Self-pay

## 2024-02-27 NOTE — Discharge Instructions (Addendum)
 Post Procedure Spinal Discharge Instruction Sheet  You may resume a regular diet and any medications that you routinely take (including pain medications) unless otherwise noted by MD.    No driving day of procedure.  Light activity throughout the rest of the day.  Do not do any strenuous work, exercise, bending or lifting.  The day following the procedure, you can resume normal physical activity but you should refrain from exercising or physical therapy for at least three days thereafter.  You may apply ice to the injection site, 20 minutes on, 20 minutes off, as needed. Do not apply ice directly to skin.    Common Side Effects:  Headaches- take your usual medications as directed by your physician.  Increase your fluid intake.  Caffeinated beverages may be helpful.  Lie flat in bed until your headache resolves.  Restlessness or inability to sleep- you may have trouble sleeping for the next few days.  Ask your referring physician if you need any medication for sleep.  Facial flushing or redness- should subside within a few days.  Increased pain- a temporary increase in pain a day or two following your procedure is not unusual.  Take your pain medication as prescribed by your referring physician.  Leg cramps  Please contact our office at [phone] for the following symptoms: Fever greater than 100 degrees. Headaches unresolved with medication after 2-3 days. Increased swelling, pain, or redness at injection site.   Thank you for visiting [facility] today!   You May Restart Your Aspirin Today

## 2024-02-29 ENCOUNTER — Inpatient Hospital Stay
Admission: RE | Admit: 2024-02-29 | Discharge: 2024-02-29 | Disposition: A | Discharge status: Home / Self Care | Source: Ambulatory Visit | Attending: Physical Medicine & Rehabilitation | Admitting: Physical Medicine & Rehabilitation

## 2024-03-06 ENCOUNTER — Other Ambulatory Visit (HOSPITAL_COMMUNITY): Payer: Self-pay

## 2024-03-06 ENCOUNTER — Other Ambulatory Visit: Payer: Self-pay | Admitting: Interventional Radiology

## 2024-03-06 DIAGNOSIS — N401 Enlarged prostate with lower urinary tract symptoms: Secondary | ICD-10-CM

## 2024-03-06 NOTE — Discharge Instructions (Signed)

## 2024-03-07 ENCOUNTER — Other Ambulatory Visit (HOSPITAL_COMMUNITY): Payer: Self-pay

## 2024-03-08 ENCOUNTER — Ambulatory Visit
Admission: RE | Admit: 2024-03-08 | Discharge: 2024-03-08 | Disposition: A | Source: Ambulatory Visit | Attending: Physical Medicine & Rehabilitation | Admitting: Physical Medicine & Rehabilitation

## 2024-03-08 DIAGNOSIS — I889 Nonspecific lymphadenitis, unspecified: Secondary | ICD-10-CM

## 2024-03-08 DIAGNOSIS — M5412 Radiculopathy, cervical region: Secondary | ICD-10-CM

## 2024-03-08 MED ORDER — IOPAMIDOL (ISOVUE-M 300) INJECTION 61%
1.0000 mL | Freq: Once | INTRAMUSCULAR | Status: AC | PRN
  Administered 2024-03-08: 1 mL via EPIDURAL

## 2024-03-08 MED ORDER — TRIAMCINOLONE ACETONIDE 40 MG/ML IJ SUSP (RADIOLOGY)
60.0000 mg | Freq: Once | INTRAMUSCULAR | Status: AC
  Administered 2024-03-08: 60 mg via EPIDURAL

## 2024-03-14 ENCOUNTER — Telehealth

## 2024-03-14 ENCOUNTER — Ambulatory Visit
Admission: RE | Admit: 2024-03-14 | Discharge: 2024-03-14 | Disposition: A | Discharge status: Home / Self Care | Source: Ambulatory Visit | Attending: Interventional Radiology | Admitting: Interventional Radiology

## 2024-03-14 DIAGNOSIS — N401 Enlarged prostate with lower urinary tract symptoms: Secondary | ICD-10-CM

## 2024-03-14 HISTORY — PX: IR RADIOLOGIST EVAL & MGMT: IMG5224

## 2024-03-14 NOTE — Progress Notes (Signed)
 Chief Complaint: Patient was seen in consultation today for BPH with LUTS at the request of Clayton Bosserman K  Referring Physician(s): Latresa Gasser K  History of Present Illness: Anthony Knight is a 63 y.o. male with a history of BPH and severe LUTS who I initially saw on 10/05/23.  At that time his IPSS was 21 and his QoL score was 6 (terrible).     ON initial follow-up, his IPSS has improved to 2 and his QoL score is now 0 (delighted).  This is an amazing result and his is very happy on this front.    Unfortunately, over the past several months his clinical result has deteriorated and he new has recurrent severe LUTS.  He is again having to urinate numerous times throughout the day, his stream has again grown weak and it takes a long time to empty.    Past Medical History:  Diagnosis Date   Cancer [facility])    colon cancer   History of kidney stones    early 2018   Hypertension    Stricture of sigmoid colon [facility])     Past Surgical History:  Procedure Laterality Date   COLONOSCOPY N/A 03/01/2018   Procedure: COLONOSCOPY;  Surgeon: Teresa Lonni HERO, MD;  [facility];  Service: General;  Laterality: N/A;   COLONOSCOPY WITH PROPOFOL  N/A 10/19/2022   Procedure: COLONOSCOPY WITH PROPOFOL ;  Surgeon: Tye Millet, DO;  Location: [facility];  Service: General;  Laterality: N/A;   CYSTOSCOPY W/ URETERAL STENT PLACEMENT Bilateral 10/10/2018   Procedure: CYSTOSCOPY WITH BILATERAL RETROGRADE PYELOGRAM/URETERAL BILATERAL STENT PLACEMENT AND LASER,RIGHT URETERAL LASER LITHOTRIPSY,LEFT DIAGNOSTIC RETROGRADE;  Surgeon: Alvaro Hummer, MD;  [facility];  Service: Urology;  Laterality: Bilateral;   IR EMBO ARTERIAL NOT HEMORR HEMANG INC GUIDE ROADMAPPING  10/17/2023   IR RADIOLOGIST EVAL & MGMT  10/05/2023   IR RADIOLOGIST EVAL & MGMT  11/21/2023   IR RADIOLOGIST EVAL & MGMT  03/14/2024   LAPAROSCOPIC SIGMOID COLECTOMY N/A 05/26/2017   Procedure: LAPAROSCOPIC ASSISTED SIGMOID  COLECTOMY;  Surgeon: Curvin Deward MOULD, MD;  [facility];  Service: General;  Laterality: N/A;  ERAS PATHWAY   PORT-A-CATH REMOVAL N/A 03/01/2018   Procedure: REMOVAL PORT-A-CATH;  Surgeon: Curvin Deward MOULD, MD;  [facility];  Service: General;  Laterality: N/A;   PORTACATH PLACEMENT Left 06/22/2017   Procedure: INSERTION PORT-A-CATH;  Surgeon: Curvin Deward MOULD, MD;  Location: [facility];  Service: General;  Laterality: Left;   rectal tear     Per patient report   SIGMOIDOSCOPY N/A 05/26/2017   Procedure: RIGID SIGMOIDOSCOPY;  Surgeon: Curvin Deward MOULD, MD;  Location: [facility] OR;  Service: General;  Laterality: N/A;   URETERAL STENT PLACEMENT  2002   kidney stones removal as well    Allergies: Patient has no known allergies.  Medications: Prior to Admission medications   Medication Sig Start Date End Date Taking? Authorizing Provider  aspirin  EC 81 MG tablet Take 1 tablet (81 mg total) by mouth daily. 02/18/15   Cook, Jayce G, DO  atorvastatin  (LIPITOR) 10 MG tablet Take 10 mg by mouth daily. 03/10/21   [provider]  atorvastatin  (LIPITOR) 20 MG tablet Take 1 tablet (20 mg total) by mouth daily. 08/17/23     bisacodyl  5 MG EC tablet Take 1 tablet (5 mg total) by mouth daily as needed for moderate constipation. 10/17/23   Lucila Delon BROCKS, NP  hydrochlorothiazide  (HYDRODIURIL ) 25 MG tablet Take 1 tablet (25 mg total)  by mouth daily. Patient not taking: Reported on 09/06/2023 07/19/22     losartan  (COZAAR ) 100 MG tablet Take 1 tablet (100 mg total) by mouth 2 (two) times daily. 05/11/23     methylPREDNISolone  (MEDROL  DOSEPAK) 4 MG TBPK tablet Take as directed. 10/17/23   Lucila Delon BROCKS, NP  predniSONE  (DELTASONE ) 20 MG tablet Take 3 tablets (60 mg total) by mouth once daily for 2 days, THEN 2 tablets (40 mg total) once daily for 2 days, THEN 1 tablet (20 mg total) once daily for 2 days. 08/14/23     tamsulosin  (FLOMAX ) 0.4 MG CAPS capsule Take 1 capsule (0.4 mg  total) by mouth daily. Take 30 minutes after same meal each day. 05/29/23     traZODone  (DESYREL ) 50 MG tablet Take 1 tablet (50 mg total) by mouth at bedtime 03/30/23        Family History  Problem Relation Age of Onset   Cancer Father        prostate   Cancer Maternal Grandfather        lung and colon   Cancer Maternal Uncle        colon   Cancer Maternal Uncle        spleen   Cancer Maternal Uncle     Social History   Socioeconomic History   Marital status: Married    Spouse name: Not on file   Number of children: Not on file   Years of education: Not on file   Highest education level: Not on file  Occupational History   Not on file  Tobacco Use   Smoking status: Former    Current packs/day: 0.00    Types: Cigarettes    Quit date: 02/17/1985    Years since quitting: 39.0   Smokeless tobacco: Never  Vaping Use   Vaping status: Never Used  Substance and Sexual Activity   Alcohol use: Yes    Alcohol/week: 0.0 - 1.0 standard drinks of alcohol    Comment: very rare   Drug use: No   Sexual activity: Yes    Partners: Female  Other Topics Concern   Not on file  Social History Narrative   Not on file   Social Drivers of Health   Financial Resource Strain: Low Risk  (02/02/2024)   Received from [facility]   Overall Financial Resource Strain (CARDIA)    Difficulty of Paying Living Expenses: Not hard at all  Food Insecurity: No Food Insecurity (02/02/2024)   Received from [facility]   Hunger Vital Sign    Within the past 12 months, you worried that your food would run out before you got the money to buy more.: Never true    Within the past 12 months, the food you bought just didn't last and you didn't have money to get more.: Never true  Transportation Needs: No Transportation Needs (02/02/2024)   Received from [facility] - Transportation    In the past 12 months, has lack of transportation kept you from  medical appointments or from getting medications?: No    Lack of Transportation (Non-Medical): No  Physical Activity: Not on file  Stress: Not on file  Social Connections: Not on file    Review of Systems: A 12 point ROS discussed and pertinent positives are indicated in the HPI above.  All other systems are negative.  Review of Systems  Vital Signs: There were no vitals taken for this visit.  Physical Exam Constitutional:      General: He is not in acute distress.    Appearance: Normal appearance. He is normal weight.  HENT:     Head: Normocephalic and atraumatic.  Eyes:     General: No scleral icterus. Pulmonary:     Effort: Pulmonary effort is normal.  Skin:    General: Skin is warm and dry.  Neurological:     Mental Status: He is alert and oriented to person, place, and time.  Psychiatric:        Mood and Affect: Mood normal.        Behavior: Behavior normal.       Imaging: IR Radiologist Eval & Mgmt Result Date: 03/14/2024 EXAM: NEW PATIENT OFFICE VISIT CHIEF COMPLAINT: SEE NOTE IN EPIC HISTORY OF PRESENT ILLNESS: SEE NOTE IN EPIC REVIEW OF SYSTEMS: SEE NOTE IN EPIC PHYSICAL EXAMINATION: SEE NOTE IN EPIC ASSESSMENT AND PLAN: SEE NOTE IN EPIC Electronically Signed   By: Wilkie Lent M.D.   On: 03/14/2024 13:19   DG INJECT DIAG/THERA/INC NEEDLE/CATH/PLC EPI/CERV/THOR W/IMG Result Date: 03/08/2024 CLINICAL DATA:  Cervical spondylosis without myelopathy. Right greater than left neck pain. Partial response to previous cervical epidural injections in 2022 and 2023. FLUOROSCOPY: Radiation Exposure Index (as provided by the fluoroscopic device): 2.20 mGy Kerma PROCEDURE: The procedure, risks, benefits, and alternatives were explained to the patient. Questions regarding the procedure were encouraged and answered. The patient understands and consents to the procedure. CERVICAL EPIDURAL INJECTION An interlaminar approach was performed on the right at C7-T1. A 3.5 inch 20  gauge epidural needle was advanced using loss-of-resistance technique. DIAGNOSTIC EPIDURAL INJECTION Injection of Isovue -M 300 shows a good epidural pattern with spread above and below the level of needle placement primarily on the right. No vascular opacification is seen. THERAPEUTIC EPIDURAL INJECTION 60 mg of Kenalog  mixed with 2 mL of normal saline were then instilled. The procedure was well-tolerated, and the patient was discharged 20 minutes following the injection in good condition. IMPRESSION: Technically successful interlaminar epidural injection on the right at C7-T1. Electronically Signed   By: Dasie Hamburg M.D.   On: 03/08/2024 12:42    Labs:  CBC: Recent Labs    10/17/23 0834  WBC 8.2  HGB 15.6  HCT 44.8  PLT 279    COAGS: Recent Labs    10/17/23 0834  INR 1.0    BMP: Recent Labs    10/17/23 0834  NA 139  K 4.3  CL 107  CO2 24  GLUCOSE 132*  BUN 18  CALCIUM  9.2  CREATININE 1.36*  GFRNONAA 59*    LIVER FUNCTION TESTS: No results for input(s): BILITOT, AST, ALT, ALKPHOS, PROT, ALBUMIN in the last 8760 hours.  TUMOR MARKERS: Recent Labs    07/05/23 0958  CEA 1.38    Assessment and Plan:  63 yo male with BPH and recurrent significant LUTS.  He did great for the first several months post PAE but now has early recurrent symptoms.  We discussed repeat PAE versus surgical intervention.  He remains uninterested in surgery but would like to proceed with repeated PAE.   1.) Please schedule for repeat PAE at [facility].    Electronically Signed: Wilkie MARLA Lent 03/14/2024, 4:41 PM   This encounter was conducted via the [employer] providing interactive audio and visual communication.  The patient provided verbal consent to conduct a virtual appointment.  The patient was located at their primary residence during this encounter.   I spent a total  of 15 Minutes in face to face in clinical consultation, greater than 50% of which was  counseling/coordinating care for BPH with LUTS

## 2024-03-15 ENCOUNTER — Other Ambulatory Visit (HOSPITAL_COMMUNITY): Payer: Self-pay

## 2024-03-19 ENCOUNTER — Other Ambulatory Visit: Payer: Self-pay

## 2024-03-19 ENCOUNTER — Other Ambulatory Visit: Payer: Self-pay | Admitting: Interventional Radiology

## 2024-03-19 DIAGNOSIS — N138 Other obstructive and reflux uropathy: Secondary | ICD-10-CM

## 2024-04-02 NOTE — Progress Notes (Signed)
 Chief Complaint: Chief Complaint  Patient presents with  . Follow-up  Neck pain traveling intermittently into the right arm  HPI: Patient is a pleasant 63 y.o. male seen in follow-up for the evaluation of neck pain that very intermittently travels into the right shoulder.  He is status post C7-T1 IL ESI 03/08/2024 with 50% improvement.  He denies any problems or complications postinjection.  He does continue to have neck pain but is complaining mainly now of right occipital pain which radiates up into the scalp.  Pain is chronic.  He rates his pain as a 6/10.  It is constant, sharp, dull.  He denies any injury.  Pain is worse with standing, twisting, sitting and better with nothing.  He does note having done physical therapy in the past which did not help. He denies any numbness, tingling, weakness or loss of control of bowel or bladder.  Review of Systems: A 10 point review of systems is negative, except for the pertinent positives and negatives detailed in the HPI.  PMH: Past Medical History:  Diagnosis Date  . Cancer (CMS/HHS-HCC) 2018   colon  . Hypertension   . Nephrolithiasis      PSH: History reviewed. No pertinent surgical history.   Family History: Family History  Problem Relation Name Age of Onset  . Recurrent UTI Mother    . No Known Problems Father    . No Known Problems Sister    . No Known Problems Son    . Colon cancer Maternal Uncle    . Colon cancer Maternal Grandfather    . No Known Problems Sister    . No Known Problems Sister    . Lung cancer Maternal Uncle    . Cancer Maternal Uncle         spleen     Social History: Social History   Socioeconomic History  . Marital status: Married    Spouse name: Romaine Neville  . Number of children: 1  . Years of education: 87  . Highest education level: High school graduate  Occupational History  . Occupation: Surveyor, Minerals  Tobacco Use  . Smoking status: Former    Current packs/day: 0.00    Average packs/day:  1 pack/day for 12.0 years (12.0 ttl pk-yrs)    Types: Cigarettes    Start date: 75    Quit date: 1991    Years since quitting: 34.8  . Smokeless tobacco: Never  Vaping Use  . Vaping status: Never Used  Substance and Sexual Activity  . Alcohol use: Not Currently  . Drug use: Never  . Sexual activity: Yes    Partners: Female    Birth control/protection: None   Social Drivers of Health   Financial Resource Strain: Low Risk  (02/02/2024)   Overall Financial Resource Strain (CARDIA)   . Difficulty of Paying Living Expenses: Not hard at all  Food Insecurity: No Food Insecurity (02/02/2024)   Hunger Vital Sign   . Worried About Programme Researcher, Broadcasting/film/video in the Last Year: Never true   . Ran Out of Food in the Last Year: Never true  Transportation Needs: No Transportation Needs (02/02/2024)   PRAPARE - Transportation   . Lack of Transportation (Medical): No   . Lack of Transportation (Non-Medical): No     Allergies: No Known Allergies   Medications:  Current Outpatient Medications:  .  aspirin  81 MG EC tablet, Take 81 mg by mouth once daily   , Disp: , Rfl:  .  atorvastatin  (  LIPITOR) 20 MG tablet, Take 1 tablet (20 mg total) by mouth once daily, Disp: 90 tablet, Rfl: 3 .  lactulose (ENULOSE) 10 gram/15 mL oral solution, Take 15 mLs by mouth 2 (two) times daily as needed for Constipation, Disp: 473 mL, Rfl: 1 .  losartan  (COZAAR ) 100 MG tablet, Take 1 tablet (100 mg total) by mouth 2 (two) times daily, Disp: 180 tablet, Rfl: 3 .  tamsulosin  (FLOMAX ) 0.4 mg capsule, Take 1 capsule (0.4 mg total) by mouth once daily Take 30 minutes after same meal each day., Disp: 90 capsule, Rfl: 3 .  traZODone  (DESYREL ) 50 MG tablet, Take 1 tablet (50 mg total) by mouth at bedtime, Disp: 90 tablet, Rfl: 3  Vitals: Vitals:   04/02/24 1024  BP: 139/81  Pulse: 67  Temp: 36.1 C (97 F)  TempSrc: Oral  Weight: (!) 102.1 kg (225 lb 1.4 oz)  Height: 182.9 cm (6' 0.01)  PainSc:   6  PainLoc: Neck    Imaging: X-ray cervical spine 02/2021: Moderate to severe multilevel DDD worse at C4-5 and C6-7  [facility] cervical [facility] 03/2021: C2-3 mild right NFS; C3-4 moderate left NFS; C4-5 moderate CCS with severe left and moderate right NFS; C5-6 mild CCS and moderate bilateral NFS; C6-7 mild CCS and moderate bilateral NFS  Platelet count 268 drawn on 02/02/2024 GFR 62 drawn on 02/02/2024  Assessment: Cervicalgia  Bilateral cervical radiculitis intermittent -s/p C7-T1 IL ESI 05/06/2021 with 60 to 70% improvement -s/p C7-T1 IL ESI 05/20/2022 with 70% relief -s/p C7-T1 IL ESI 03/08/2024 with 50% relief  History of hypertension  History of prediabetes with Hemoglobin A1c 6.1 drawn on 02/02/2024  History of colon cancer  Plan: He denies any problems or complications postinjection.  Unfortunately he did not get as much relief with this injection as he has had in the past.  I did discuss with him I do feel that his pain is multifactorial.  I do feel that some of his pain is coming from occipital neuralgia on the right side.  We did discuss treatment options for this as well as injections.  He is interested in moving forward with this.  I will refer him to neurology for evaluation of right occipital nerve block. We did spend some time discussing the pathology of his neck as well as treatment options and future prognosis.  I did answer his questions to the best of my ability.    I have discussed with him in the past gabapentin , second surgical opinion as well as spinal cord stimulator.  I did review the note from Dr. Bluford back in November 2022.  Surgery is not recommended.  Follow-up as needed.  I would consider starting on gabapentin  again.  Would consider referral for pain management for spinal cord stimulator.   I spent a total of 30 minutes in both face-to-face (discussing pathology, treatment and/or coordinating care) and non-face-to-face activities, excluding procedures  performed, for this visit on the date of this encounter.  Patient agrees with above plan.  Answered all questions.

## 2024-04-03 ENCOUNTER — Other Ambulatory Visit: Payer: Self-pay | Admitting: Radiology

## 2024-04-03 DIAGNOSIS — N401 Enlarged prostate with lower urinary tract symptoms: Secondary | ICD-10-CM

## 2024-04-04 ENCOUNTER — Other Ambulatory Visit: Payer: Self-pay

## 2024-04-04 NOTE — Progress Notes (Signed)
 Patient for IR PAE on Friday 04/05/24, I called and spoke with the patient on the phone and gave pre-procedure instructions. Pt was made aware to be here at 9:30a, last dose of ASA 81mg  was Sat 03/30/24, NPO after MN prior to procedure as well as driver post procedure/recovery/discharge. Pt stated understanding.  Called 04/04/24

## 2024-04-05 ENCOUNTER — Other Ambulatory Visit (HOSPITAL_COMMUNITY): Payer: Self-pay

## 2024-04-05 ENCOUNTER — Other Ambulatory Visit: Payer: Self-pay | Admitting: Radiology

## 2024-04-05 ENCOUNTER — Other Ambulatory Visit: Payer: Self-pay

## 2024-04-05 ENCOUNTER — Encounter: Payer: Self-pay | Admitting: Oncology

## 2024-04-05 ENCOUNTER — Other Ambulatory Visit: Payer: Self-pay | Admitting: Physician Assistant

## 2024-04-05 ENCOUNTER — Ambulatory Visit
Admission: RE | Admit: 2024-04-05 | Discharge: 2024-04-05 | Disposition: A | Discharge status: Home / Self Care | Source: Ambulatory Visit | Attending: Interventional Radiology | Admitting: Interventional Radiology

## 2024-04-05 VITALS — BP 117/78 | HR 59 | Temp 98.2°F | Resp 19 | Ht 72.0 in | Wt 220.0 lb

## 2024-04-05 DIAGNOSIS — N138 Other obstructive and reflux uropathy: Secondary | ICD-10-CM | POA: Insufficient documentation

## 2024-04-05 DIAGNOSIS — Z9221 Personal history of antineoplastic chemotherapy: Secondary | ICD-10-CM | POA: Insufficient documentation

## 2024-04-05 DIAGNOSIS — N401 Enlarged prostate with lower urinary tract symptoms: Secondary | ICD-10-CM | POA: Diagnosis present

## 2024-04-05 DIAGNOSIS — Z87891 Personal history of nicotine dependence: Secondary | ICD-10-CM | POA: Insufficient documentation

## 2024-04-05 DIAGNOSIS — Z85038 Personal history of other malignant neoplasm of large intestine: Secondary | ICD-10-CM | POA: Insufficient documentation

## 2024-04-05 DIAGNOSIS — I10 Essential (primary) hypertension: Secondary | ICD-10-CM | POA: Insufficient documentation

## 2024-04-05 DIAGNOSIS — Z79899 Other long term (current) drug therapy: Secondary | ICD-10-CM | POA: Insufficient documentation

## 2024-04-05 HISTORY — PX: IR EMBO TUMOR ORGAN ISCHEMIA INFARCT INC GUIDE ROADMAPPING: IMG5449

## 2024-04-05 LAB — BASIC METABOLIC PANEL WITH GFR
Anion gap: 10 (ref 5–15)
BUN: 15 mg/dL (ref 8–23)
CO2: 25 mmol/L (ref 22–32)
Calcium: 9.6 mg/dL (ref 8.9–10.3)
Chloride: 106 mmol/L (ref 98–111)
Creatinine, Ser: 1.19 mg/dL (ref 0.61–1.24)
GFR, Estimated: >60 mL/min (ref >60)
Glucose, Bld: 122 mg/dL — ABNORMAL HIGH (ref 70–99)
Potassium: 4.1 mmol/L (ref 3.5–5.1)
Sodium: 140 mmol/L (ref 135–145)

## 2024-04-05 LAB — CBC
HCT: 45.7 % (ref 39.0–52.0)
Hemoglobin: 15.3 g/dL (ref 13.0–17.0)
MCH: 29.7 pg (ref 26.0–34.0)
MCHC: 33.5 g/dL (ref 30.0–36.0)
MCV: 88.6 fL (ref 80.0–100.0)
Platelets: 252 K/uL (ref 150–400)
RBC: 5.16 MIL/uL (ref 4.22–5.81)
RDW: 13.3 % (ref 11.5–15.5)
WBC: 7.1 K/uL (ref 4.0–10.5)
nRBC: 0 % (ref 0.0–0.2)

## 2024-04-05 LAB — PROTIME-INR
INR: 0.9 (ref 0.8–1.2)
Prothrombin Time: 12.8 s (ref 11.4–15.2)

## 2024-04-05 MED ORDER — SOLIFENACIN SUCCINATE 5 MG PO TABS
5.0000 mg | ORAL_TABLET | Freq: Every day | ORAL | 0 refills | Status: AC
  Filled 2024-04-05 – 2024-05-01 (×4): qty 7, 7d supply, fill #0

## 2024-04-05 MED ORDER — IOHEXOL 300 MG/ML  SOLN
200.0000 mL | Freq: Once | INTRAMUSCULAR | Status: AC | PRN
  Administered 2024-04-05: 110 mL via INTRA_ARTERIAL

## 2024-04-05 MED ORDER — SOLIFENACIN SUCCINATE 5 MG PO TABS: 5.0000 mg | ORAL_TABLET | Freq: Every day | ORAL | 0 refills | Status: AC

## 2024-04-05 MED ORDER — LIDOCAINE HCL 1 % IJ SOLN
20.0000 mL | Freq: Once | INTRAMUSCULAR | Status: AC
  Administered 2024-04-05: 5 mL

## 2024-04-05 MED ORDER — CIPROFLOXACIN HCL 500 MG PO TABS
500.0000 mg | ORAL_TABLET | Freq: Two times a day (BID) | ORAL | 0 refills | Status: AC
  Filled 2024-04-05 – 2024-05-01 (×4): qty 14, 7d supply, fill #0

## 2024-04-05 MED ORDER — FENTANYL CITRATE (PF) 100 MCG/2ML IJ SOLN
INTRAMUSCULAR | Status: AC
  Filled 2024-04-05: qty 2

## 2024-04-05 MED ORDER — PHENAZOPYRIDINE HCL 100 MG PO TABS: 100.0000 mg | ORAL_TABLET | Freq: Three times a day (TID) | ORAL | 0 refills | Status: AC

## 2024-04-05 MED ORDER — MIDAZOLAM HCL 2 MG/2ML IJ SOLN
INTRAMUSCULAR | Status: AC
  Filled 2024-04-05: qty 2

## 2024-04-05 MED ORDER — SODIUM CHLORIDE 0.9 % IV SOLN: INTRAVENOUS | Status: DC

## 2024-04-05 MED ORDER — NITROGLYCERIN 1 MG/10 ML FOR IR/CATH LAB
INTRA_ARTERIAL | Status: AC
  Filled 2024-04-05: qty 10

## 2024-04-05 MED ORDER — CIPROFLOXACIN IN D5W 400 MG/200ML IV SOLN
400.0000 mg | INTRAVENOUS | Status: AC
  Administered 2024-04-05: 400 mg via INTRAVENOUS
  Filled 2024-04-05: qty 200

## 2024-04-05 MED ORDER — LIDOCAINE HCL 1 % IJ SOLN
INTRAMUSCULAR | Status: AC
  Filled 2024-04-05: qty 20

## 2024-04-05 MED ORDER — NITROGLYCERIN 1 MG/10 ML FOR IR/CATH LAB
INTRA_ARTERIAL | Status: AC | PRN
  Administered 2024-04-05 (×2): 100 ug via INTRA_ARTERIAL

## 2024-04-05 MED ORDER — PHENAZOPYRIDINE HCL 100 MG PO TABS
100.0000 mg | ORAL_TABLET | Freq: Three times a day (TID) | ORAL | 0 refills | Status: AC | PRN
  Filled 2024-04-05: qty 21, 7d supply, fill #0

## 2024-04-05 MED ORDER — FENTANYL CITRATE (PF) 100 MCG/2ML IJ SOLN
INTRAMUSCULAR | Status: AC | PRN
  Administered 2024-04-05 (×6): 25 ug via INTRAVENOUS
  Administered 2024-04-05 (×2): 50 ug via INTRAVENOUS

## 2024-04-05 MED ORDER — MIDAZOLAM HCL (PF) 2 MG/2ML IJ SOLN
INTRAMUSCULAR | Status: AC | PRN
  Administered 2024-04-05: 1 mg via INTRAVENOUS
  Administered 2024-04-05 (×5): .5 mg via INTRAVENOUS
  Administered 2024-04-05: 2 mg via INTRAVENOUS
  Administered 2024-04-05: .5 mg via INTRAVENOUS

## 2024-04-05 MED ORDER — CHLORHEXIDINE GLUCONATE CLOTH 2 % EX PADS
6.0000 | MEDICATED_PAD | Freq: Every day | CUTANEOUS | Status: DC
  Administered 2024-04-05: 6 via TOPICAL

## 2024-04-05 MED ORDER — NAPROXEN 500 MG PO TABS
500.0000 mg | ORAL_TABLET | Freq: Two times a day (BID) | ORAL | 0 refills | Status: AC | PRN
  Filled 2024-04-05: qty 10, 5d supply, fill #0

## 2024-04-05 MED ORDER — CIPROFLOXACIN HCL 500 MG PO TABS: 500.0000 mg | ORAL_TABLET | Freq: Two times a day (BID) | ORAL | 0 refills | Status: AC

## 2024-04-05 NOTE — Procedures (Signed)
 Interventional Radiology Procedure Note  Procedure: PAE  Complications: None  Estimated Blood Loss: None  Recommendations: - DC home   Signed,  Wilkie LOIS Lent, MD

## 2024-04-05 NOTE — Progress Notes (Signed)
 Patient clinically stable post IR PAE per Dr Karalee, tolerated well. Awake/alert and oriented post procedure, wife at bedside. Foley removed per md request, taking po's without difficulty. Denies complaints at present.  Dr Karalee in to speak with patient and wife post procedure with full update of procedure given.

## 2024-04-05 NOTE — H&P (Signed)
 Chief Complaint: Patient was seen in consultation today for benign prostatic hyperplasia s/p PAE in May of this year with recurrence of symptoms   Procedure: Angiogram with possible Prostate Artery Embolization  Referring Physician(s): Dr. Rosina Riis  Supervising Physician: Karalee Beat  Patient Status: [facility] - Out-pt  History of Present Illness: Anthony Knight is a 63 y.o. male with a history of HTN, kidney stones s/p bilateral double J stents, adenocarcinoma of the sigmoid colon s/p colectomy and chemotherapy, and benign prostatic hyperplasia known to IR from previous PAE with Dr. Karalee in 09/2023. At initial follow up in July patient was reporting improvement of symptoms; however, he reports recurrence of symptoms not long after this appointment. At next follow up on 10/22, patient reported IPSS score of 21 and QoL score of 6 (terrible). Patient is still not interested in surgical interventions at this time and was willing to attempt repeat embolization with Dr. Karalee.   He presents today with family at the bedside for his procedure. States that he continues to have to urinate several times throughout the day, including 2-3 times in the evening. Also reports difficulty initiating urination and weak stream. Denies any dysuria, hematuria, fevers/chills, abdominal pain, chest pain, or shortness of breath. NPO since midnight. All questions and concerns answered at the bedside.   Code Status: Full Code  Past Medical History:  Diagnosis Date   Cancer (HCC)    colon cancer   History of kidney stones    early 2018   Hypertension    Stricture of sigmoid colon [facility])     Past Surgical History:  Procedure Laterality Date   COLONOSCOPY N/A 03/01/2018   Procedure: COLONOSCOPY;  Surgeon: Teresa Lonni HERO, MD;  Location: [facility]S;  Service: General;  Laterality: N/A;   COLONOSCOPY WITH PROPOFOL  N/A 10/19/2022   Procedure: COLONOSCOPY WITH PROPOFOL ;  Surgeon: Tye Millet,  DO;  Location: [facility] ENDOSCOPY;  Service: General;  Laterality: N/A;   CYSTOSCOPY W/ URETERAL STENT PLACEMENT Bilateral 10/10/2018   Procedure: CYSTOSCOPY WITH BILATERAL RETROGRADE PYELOGRAM/URETERAL BILATERAL STENT PLACEMENT AND LASER,RIGHT URETERAL LASER LITHOTRIPSY,LEFT DIAGNOSTIC RETROGRADE;  Surgeon: Alvaro Hummer, MD;  Location: [facility] ORS;  Service: Urology;  Laterality: Bilateral;   IR EMBO ARTERIAL NOT HEMORR HEMANG INC GUIDE ROADMAPPING  10/17/2023   IR RADIOLOGIST EVAL & MGMT  10/05/2023   IR RADIOLOGIST EVAL & MGMT  11/21/2023   IR RADIOLOGIST EVAL & MGMT  03/14/2024   LAPAROSCOPIC SIGMOID COLECTOMY N/A 05/26/2017   Procedure: LAPAROSCOPIC ASSISTED SIGMOID COLECTOMY;  Surgeon: Curvin Deward MOULD, MD;  Location: [facility] OR;  Service: General;  Laterality: N/A;  ERAS PATHWAY   PORT-A-CATH REMOVAL N/A 03/01/2018   Procedure: REMOVAL PORT-A-CATH;  Surgeon: Curvin Deward MOULD, MD;  Location: [facility];  Service: General;  Laterality: N/A;   PORTACATH PLACEMENT Left 06/22/2017   Procedure: INSERTION PORT-A-CATH;  Surgeon: Curvin Deward MOULD, MD;  Location: [facility];  Service: General;  Laterality: Left;   rectal tear     Per patient report   SIGMOIDOSCOPY N/A 05/26/2017   Procedure: RIGID SIGMOIDOSCOPY;  Surgeon: Curvin Deward MOULD, MD;  Location: [facility] OR;  Service: General;  Laterality: N/A;   URETERAL STENT PLACEMENT  2002   kidney stones removal as well    Allergies: Patient has no known allergies.  Medications: Prior to Admission medications   Medication Sig Start Date End Date Taking? Authorizing Provider  aspirin  EC 81 MG tablet Take 1 tablet (81 mg total) by mouth daily. 02/18/15  Yes  Cook, Jayce G, DO  atorvastatin  (LIPITOR) 10 MG tablet Take 10 mg by mouth daily. 03/10/21  Yes [provider]  atorvastatin  (LIPITOR) 20 MG tablet Take 1 tablet (20 mg total) by mouth daily. 08/17/23  Yes   bisacodyl  5 MG EC tablet Take 1 tablet (5 mg total) by mouth daily as needed for moderate  constipation. 10/17/23  Yes Lucila Delon BROCKS, NP  losartan  (COZAAR ) 100 MG tablet Take 1 tablet (100 mg total) by mouth 2 (two) times daily. 05/11/23  Yes   tamsulosin  (FLOMAX ) 0.4 MG CAPS capsule Take 1 capsule (0.4 mg total) by mouth daily. Take 30 minutes after same meal each day. 05/29/23  Yes   traZODone  (DESYREL ) 50 MG tablet Take 1 tablet (50 mg total) by mouth at bedtime 03/30/23  Yes   hydrochlorothiazide  (HYDRODIURIL ) 25 MG tablet Take 1 tablet (25 mg total) by mouth daily. Patient not taking: Reported on 09/06/2023 07/19/22     methylPREDNISolone  (MEDROL  DOSEPAK) 4 MG TBPK tablet Take as directed. 10/17/23   Lucila Delon BROCKS, NP  predniSONE  (DELTASONE ) 20 MG tablet Take 3 tablets (60 mg total) by mouth once daily for 2 days, THEN 2 tablets (40 mg total) once daily for 2 days, THEN 1 tablet (20 mg total) once daily for 2 days. 08/14/23        Family History  Problem Relation Age of Onset   Cancer Father        prostate   Cancer Maternal Grandfather        lung and colon   Cancer Maternal Uncle        colon   Cancer Maternal Uncle        spleen   Cancer Maternal Uncle     Social History   Socioeconomic History   Marital status: Married    Spouse name: Not on file   Number of children: Not on file   Years of education: Not on file   Highest education level: Not on file  Occupational History   Not on file  Tobacco Use   Smoking status: Former    Current packs/day: 0.00    Types: Cigarettes    Quit date: 02/17/1985    Years since quitting: 39.1   Smokeless tobacco: Never  Vaping Use   Vaping status: Never Used  Substance and Sexual Activity   Alcohol use: Yes    Alcohol/week: 0.0 - 1.0 standard drinks of alcohol    Comment: very rare   Drug use: No   Sexual activity: Yes    Partners: Female  Other Topics Concern   Not on file  Social History Narrative   Not on file   Social Drivers of Health   Financial Resource Strain: Low Risk  (02/02/2024)   Received  from [facility]   Overall Financial Resource Strain (CARDIA)    Difficulty of Paying Living Expenses: Not hard at all  Food Insecurity: No Food Insecurity (02/02/2024)   Received from [facility]   Hunger Vital Sign    Within the past 12 months, you worried that your food would run out before you got the money to buy more.: Never true    Within the past 12 months, the food you bought just didn't last and you didn't have money to get more.: Never true  Transportation Needs: No Transportation Needs (02/02/2024)   Received from [facility] - Transportation    In the past 12 months,  has lack of transportation kept you from medical appointments or from getting medications?: No    Lack of Transportation (Non-Medical): No  Physical Activity: Not on file  Stress: Not on file  Social Connections: Not on file    Review of Systems  Genitourinary:  Positive for difficulty urinating and frequency. Negative for dysuria.  Denies any N/V, chest pain, shortness of breath, fevers/chills. All other ROS negative.  Vital Signs: BP (!) 146/91   Pulse 73   Temp 97.8 F (36.6 C) (Tympanic)   Resp 20   Ht 6' (1.829 m)   Wt 220 lb (99.8 kg)   SpO2 97%   BMI 29.84 kg/m   Physical Exam Vitals reviewed.  Constitutional:      Appearance: Normal appearance.  HENT:     Mouth/Throat:     Mouth: Mucous membranes are moist.     Pharynx: Oropharynx is clear.  Cardiovascular:     Rate and Rhythm: Normal rate and regular rhythm.     Pulses: Normal pulses.     Heart sounds: Normal heart sounds.  Pulmonary:     Effort: Pulmonary effort is normal.     Breath sounds: Normal breath sounds.  Abdominal:     General: Abdomen is flat.     Palpations: Abdomen is soft.     Tenderness: There is no abdominal tenderness.  Skin:    General: Skin is warm and dry.  Neurological:     Mental Status: He is alert and oriented to person, place, and time.   Psychiatric:        Behavior: Behavior normal.     Imaging: IR Radiologist Eval & Mgmt Result Date: 03/14/2024 EXAM: NEW PATIENT OFFICE VISIT CHIEF COMPLAINT: SEE NOTE IN EPIC HISTORY OF PRESENT ILLNESS: SEE NOTE IN EPIC REVIEW OF SYSTEMS: SEE NOTE IN EPIC PHYSICAL EXAMINATION: SEE NOTE IN EPIC ASSESSMENT AND PLAN: SEE NOTE IN EPIC Electronically Signed   By: Wilkie Lent M.D.   On: 03/14/2024 13:19   DG INJECT DIAG/THERA/INC NEEDLE/CATH/PLC EPI/CERV/THOR W/IMG Result Date: 03/08/2024 CLINICAL DATA:  Cervical spondylosis without myelopathy. Right greater than left neck pain. Partial response to previous cervical epidural injections in 2022 and 2023. FLUOROSCOPY: Radiation Exposure Index (as provided by the fluoroscopic device): 2.20 mGy Kerma PROCEDURE: The procedure, risks, benefits, and alternatives were explained to the patient. Questions regarding the procedure were encouraged and answered. The patient understands and consents to the procedure. CERVICAL EPIDURAL INJECTION An interlaminar approach was performed on the right at C7-T1. A 3.5 inch 20 gauge epidural needle was advanced using loss-of-resistance technique. DIAGNOSTIC EPIDURAL INJECTION Injection of Isovue -M 300 shows a good epidural pattern with spread above and below the level of needle placement primarily on the right. No vascular opacification is seen. THERAPEUTIC EPIDURAL INJECTION 60 mg of Kenalog  mixed with 2 mL of normal saline were then instilled. The procedure was well-tolerated, and the patient was discharged 20 minutes following the injection in good condition. IMPRESSION: Technically successful interlaminar epidural injection on the right at C7-T1. Electronically Signed   By: Dasie Hamburg M.D.   On: 03/08/2024 12:42    Labs:  CBC: Recent Labs    10/17/23 0834 04/05/24 0949  WBC 8.2 7.1  HGB 15.6 15.3  HCT 44.8 45.7  PLT 279 252    COAGS: Recent Labs    10/17/23 0834 04/05/24 0949  INR 1.0 0.9     BMP: Recent Labs    10/17/23 0834  NA 139  K 4.3  CL  107  CO2 24  GLUCOSE 132*  BUN 18  CALCIUM  9.2  CREATININE 1.36*  GFRNONAA 59*    LIVER FUNCTION TESTS: No results for input(s): BILITOT, AST, ALT, ALKPHOS, PROT, ALBUMIN in the last 8760 hours.  TUMOR MARKERS: Recent Labs    07/05/23 0958  CEA 1.38    Assessment and Plan:  Benign Prostatic Hyperplasia with symptom recurrence following PAE in 09/2023: Anthony Knight is a 63 y.o. male with a history of benign prostatic hyperplasia s/p PAE on 10/17/23 with Dr. Karalee. At his most recent follow up patient reported recurrence of symptoms with an IPSS score of 21 and a QoL score of 6. He continues to not be interested in surgery and was willing to move forward with repeat PAE. He presents today to [facility] Interventional Radiology department for an image-guided angiogram with possible prostate artery embolization with Dr. VEAR Karalee. Procedure to be performed under moderate sedation.   -NPO since midnight -BUN/CR 15/1.19 -Foley to be placed in procedure room   The Risks and benefits of embolization were discussed with the patient including, but not limited to bleeding, infection, vascular injury, post operative pain, or contrast induced renal failure.  This procedure involves the use of X-rays and because of the nature of the planned procedure, it is possible that we will have prolonged use of X-ray fluoroscopy.  Potential radiation risks to you include (but are not limited to) the following: - A slightly elevated risk for cancer several years later in life. This risk is typically less than 0.5% percent. This risk is low in comparison to the normal incidence of human cancer, which is 33% for women and 50% for men according to the American Cancer Society. - Radiation induced injury can include skin redness, resembling a rash, tissue breakdown / ulcers and hair loss (which can be temporary or permanent).   The  likelihood of either of these occurring depends on the difficulty of the procedure and whether you are sensitive to radiation due to previous procedures, disease, or genetic conditions.   IF your procedure requires a prolonged use of radiation, you will be notified and given written instructions for further action.  It is your responsibility to monitor the irradiated area for the 2 weeks following the procedure and to notify your physician if you are concerned that you have suffered a radiation induced injury.    All of the patient's questions were answered, patient is agreeable to proceed. Consent signed and in chart.   Thank you for this interesting consult. I greatly enjoyed meeting Anthony Knight and look forward to participating in their care. A copy of this report was sent to the requesting provider on this date.  Electronically Signed: Glennon CHRISTELLA Bal, PA-C 04/05/2024, 10:32 AM   I spent a total of 15 Minutes in face to face clinical consultation, greater than 50% of which was counseling/coordinating care for benign prostatic hyperplasia.

## 2024-04-11 ENCOUNTER — Telehealth (HOSPITAL_COMMUNITY): Payer: Self-pay | Admitting: Student

## 2024-04-11 MED ORDER — METHYLPREDNISOLONE 4 MG PO TBPK: ORAL_TABLET | ORAL | 0 refills | Status: AC

## 2024-04-11 NOTE — Telephone Encounter (Signed)
 Patient underwent repeat prostate artery embolization 04/05/24 with Dr. Karalee. Patient called IR today stating he's had intense pain in his urethra during urination and this started after he got home from his procedure. The pain lasts for approximately 1 minute after he is done voiding. The pain has not gotten better or worse. He denies any other concerning symptoms such as fevers, chills, abdominal/pelvic pain, nausea/vomiting or diarrhea. He is eating/drinking/sleeping well.   Case discussed with Dr. Hughes who believes the pain may be related to foley catheterization during the PAE procedure. The pain should improve on its own but we will start the patient on a steroid dose pack to help with inflammation. The patient is agreeable to this plan. Anthony Knight asked if there was anything else he could do and I encouraged him to reach out to his Urologist, Dr. Penne, to see if she might have any suggestions.  Patient knows he can call our team with additional questions/concerns.  Anthony Knight, AGACNP-BC 04/11/2024, 1:51 PM

## 2024-04-16 ENCOUNTER — Other Ambulatory Visit (HOSPITAL_COMMUNITY): Payer: Self-pay

## 2024-04-17 ENCOUNTER — Other Ambulatory Visit: Payer: Self-pay

## 2024-04-17 ENCOUNTER — Other Ambulatory Visit (HOSPITAL_COMMUNITY): Payer: Self-pay

## 2024-04-17 ENCOUNTER — Encounter: Payer: Self-pay | Admitting: Oncology

## 2024-04-29 ENCOUNTER — Other Ambulatory Visit (HOSPITAL_COMMUNITY): Payer: Self-pay

## 2024-04-30 ENCOUNTER — Other Ambulatory Visit (HOSPITAL_COMMUNITY): Payer: Self-pay

## 2024-05-01 ENCOUNTER — Encounter: Payer: Self-pay | Admitting: Oncology

## 2024-05-01 ENCOUNTER — Other Ambulatory Visit (HOSPITAL_COMMUNITY): Payer: Self-pay

## 2024-05-02 ENCOUNTER — Inpatient Hospital Stay: Admission: RE | Admit: 2024-05-02 | Discharge: 2024-05-02 | Attending: Radiology

## 2024-05-02 ENCOUNTER — Telehealth

## 2024-05-02 DIAGNOSIS — N401 Enlarged prostate with lower urinary tract symptoms: Secondary | ICD-10-CM

## 2024-05-02 HISTORY — PX: IR RADIOLOGIST EVAL & MGMT: IMG5224

## 2024-05-02 NOTE — Progress Notes (Signed)
 Chief Complaint: Patient was consulted remotely today (TeleHealth) for BPH with LUTS at the request of McInnis,Caitlyn M.    Referring Physician(s): McInnis,Caitlyn M  History of Present Illness: Anthony Knight is a 63 y.o. male with a history of BPH and severe LUTS who I initially saw on 10/05/23.  At that time his IPSS was 21 and his QoL score was 6 (terrible).     ON initial follow-up, his IPSS has improved to 2 and his QoL score is now 0 (delighted).  This is an amazing result and his is very happy on this front.    He experienced significant recurrent symptoms approximately 6 months following his initial procedure.  After discussion, he elected to proceed with repeat prostate artery embolization.  This was successfully performed on 04/05/2024.  We had a MyChart televisit set up for today, but unfortunately his Internet is out so we could only speak over the telephone.  [name] reports that he is currently extremely happy with his result.  He has the best stream he has had in years and all of his prior symptoms have resolved.  He did have a rough first week after the procedure with significant dysuria which responded well to a steroid Dosepak.  He has since recovered and has no current clinical symptoms.  Also, no issues with his urethra and no painful white spots on the penis.  Past Medical History:  Diagnosis Date   Cancer [facility])    colon cancer   History of kidney stones    early 2018   Hypertension    Stricture of sigmoid colon [facility])     Past Surgical History:  Procedure Laterality Date   COLONOSCOPY N/A 03/01/2018   Procedure: COLONOSCOPY;  Surgeon: Teresa Lonni HERO, MD;  Location: [facility];  Service: General;  Laterality: N/A;   COLONOSCOPY WITH PROPOFOL  N/A 10/19/2022   Procedure: COLONOSCOPY WITH PROPOFOL ;  Surgeon: Tye Millet, DO;  Location: [facility];  Service: General;  Laterality: N/A;   CYSTOSCOPY W/ URETERAL STENT PLACEMENT Bilateral 10/10/2018    Procedure: CYSTOSCOPY WITH BILATERAL RETROGRADE PYELOGRAM/URETERAL BILATERAL STENT PLACEMENT AND LASER,RIGHT URETERAL LASER LITHOTRIPSY,LEFT DIAGNOSTIC RETROGRADE;  Surgeon: Alvaro Hummer, MD;  Location: [facility];  Service: Urology;  Laterality: Bilateral;   IR EMBO ARTERIAL NOT HEMORR HEMANG INC GUIDE ROADMAPPING  10/17/2023   IR EMBO TUMOR ORGAN ISCHEMIA INFARCT INC GUIDE ROADMAPPING  04/05/2024   IR RADIOLOGIST EVAL & MGMT  10/05/2023   IR RADIOLOGIST EVAL & MGMT  11/21/2023   IR RADIOLOGIST EVAL & MGMT  03/14/2024   IR RADIOLOGIST EVAL & MGMT  05/02/2024   LAPAROSCOPIC SIGMOID COLECTOMY N/A 05/26/2017   Procedure: LAPAROSCOPIC ASSISTED SIGMOID COLECTOMY;  Surgeon: Curvin Deward MOULD, MD;  Location: [facility] OR;  Service: General;  Laterality: N/A;  ERAS PATHWAY   PORT-A-CATH REMOVAL N/A 03/01/2018   Procedure: REMOVAL PORT-A-CATH;  Surgeon: Curvin Deward MOULD, MD;  Location: [facility];  Service: General;  Laterality: N/A;   PORTACATH PLACEMENT Left 06/22/2017   Procedure: INSERTION PORT-A-CATH;  Surgeon: Curvin Deward MOULD, MD;  Location: [facility];  Service: General;  Laterality: Left;   rectal tear     Per patient report   SIGMOIDOSCOPY N/A 05/26/2017   Procedure: RIGID SIGMOIDOSCOPY;  Surgeon: Curvin Deward MOULD, MD;  Location: [facility] OR;  Service: General;  Laterality: N/A;   URETERAL STENT PLACEMENT  2002   kidney stones removal as well    Allergies: Patient has no known allergies.  Medications: Prior  to Admission medications  Medication Sig Start Date End Date Taking? Authorizing Provider  aspirin  EC 81 MG tablet Take 1 tablet (81 mg total) by mouth daily. 02/18/15   Cook, Jayce G, DO  atorvastatin  (LIPITOR) 10 MG tablet Take 10 mg by mouth daily. 03/10/21   [provider]  atorvastatin  (LIPITOR) 20 MG tablet Take 1 tablet (20 mg total) by mouth daily. 08/17/23     bisacodyl  5 MG EC tablet Take 1 tablet (5 mg total) by mouth daily as needed for moderate constipation. 10/17/23   Lucila Delon BROCKS, NP  ciprofloxacin  (CIPRO ) 500 MG tablet Take 1 tablet (500 mg total) by mouth 2 (two) times daily for 7 days. 04/05/24 05/08/24  McInnis, Caitlyn M, PA-C  hydrochlorothiazide  (HYDRODIURIL ) 25 MG tablet Take 1 tablet (25 mg total) by mouth daily. Patient not taking: Reported on 09/06/2023 07/19/22     losartan  (COZAAR ) 100 MG tablet Take 1 tablet (100 mg total) by mouth 2 (two) times daily. 05/11/23     methylPREDNISolone  (MEDROL  DOSEPAK) 4 MG TBPK tablet Take as directed. 10/17/23   Lucila Delon BROCKS, NP  methylPREDNISolone  (MEDROL  DOSEPAK) 4 MG TBPK tablet Dispense per instructions on box. 04/11/24   Covington, Jamie R, NP  predniSONE  (DELTASONE ) 20 MG tablet Take 3 tablets (60 mg total) by mouth once daily for 2 days, THEN 2 tablets (40 mg total) once daily for 2 days, THEN 1 tablet (20 mg total) once daily for 2 days. 08/14/23     solifenacin  (VESICARE ) 5 MG tablet Take 1 tablet (5 mg total) by mouth daily for 7 days. 04/05/24 05/08/24  McInnis, Caitlyn M, PA-C  tamsulosin  (FLOMAX ) 0.4 MG CAPS capsule Take 1 capsule (0.4 mg total) by mouth daily. Take 30 minutes after same meal each day. 05/29/23     traZODone  (DESYREL ) 50 MG tablet Take 1 tablet (50 mg total) by mouth at bedtime 03/30/23        Family History  Problem Relation Age of Onset   Cancer Father        prostate   Cancer Maternal Grandfather        lung and colon   Cancer Maternal Uncle        colon   Cancer Maternal Uncle        spleen   Cancer Maternal Uncle     Social History   Socioeconomic History   Marital status: Married    Spouse name: Not on file   Number of children: Not on file   Years of education: Not on file   Highest education level: Not on file  Occupational History   Not on file  Tobacco Use   Smoking status: Former    Current packs/day: 0.00    Types: Cigarettes    Quit date: 02/17/1985    Years since quitting: 39.2   Smokeless tobacco: Never  Vaping Use   Vaping status: Never Used   Substance and Sexual Activity   Alcohol use: Yes    Alcohol/week: 0.0 - 1.0 standard drinks of alcohol    Comment: very rare   Drug use: No   Sexual activity: Yes    Partners: Female  Other Topics Concern   Not on file  Social History Narrative   Not on file   Social Drivers of Health   Tobacco Use: Medium Risk (04/02/2024)   Received from [facility]   Patient History    Smoking Tobacco Use: Former    Smokeless Tobacco  Use: Never    Passive Exposure: Not on file  Financial Resource Strain: Low Risk  (02/02/2024)   Received from [facility]   Overall Financial Resource Strain (CARDIA)    Difficulty of Paying Living Expenses: Not hard at all  Food Insecurity: No Food Insecurity (02/02/2024)   Received from [facility]   Epic    Within the past 12 months, you worried that your food would run out before you got the money to buy more.: Never true    Within the past 12 months, the food you bought just didn't last and you didn't have money to get more.: Never true  Transportation Needs: No Transportation Needs (02/02/2024)   Received from [facility] - Transportation    In the past 12 months, has lack of transportation kept you from medical appointments or from getting medications?: No    Lack of Transportation (Non-Medical): No  Physical Activity: Not on file  Stress: Not on file  Social Connections: Not on file  Depression (EYV7-0): Not on file  Alcohol Screen: Not on file  Housing: Low Risk  (02/02/2024)   Received from [facility]   Epic    In the last 12 months, was there a time when you were not able to pay the mortgage or rent on time?: No    In the past 12 months, how many times have you moved where you were living?: 0    At any time in the past 12 months, were you homeless or living in a shelter (including now)?: No  Utilities: Not At Risk (02/02/2024)   Received from [facility]   Epic    In the past 12 months has the electric, gas, oil, or water  company threatened to shut off services in your home?: No  Health Literacy: Not on file     Review of Systems  Review of Systems: A 12 point ROS discussed and pertinent positives are indicated in the HPI above.  All other systems are negative.    Physical Exam No direct physical exam was performed (except for noted visual exam findings with Video Visits).    Vital Signs: There were no vitals taken for this visit.  Imaging: IR Radiologist Eval & Mgmt Result Date: 05/02/2024 EXAM: ESTABLISHED PATIENT OFFICE VISIT CHIEF COMPLAINT: SEE NOTE IN EPIC HISTORY OF PRESENT ILLNESS: SEE NOTE IN EPIC REVIEW OF SYSTEMS: SEE NOTE IN EPIC PHYSICAL EXAMINATION: SEE NOTE IN EPIC ASSESSMENT AND PLAN: SEE NOTE IN EPIC Electronically Signed   By: Wilkie Lent M.D.   On: 05/02/2024 10:09   IR EMBO TUMOR ORGAN ISCHEMIA INFARCT INC GUIDE ROADMAPPING Result Date: 04/05/2024 INDICATION: 63 year old male with severe benign prostatic hyperplasia with lower urinary tract symptoms which are recurrent despite prior prostatic artery embolization. He presents for repeat angiogram and possible additional embolization. EXAM: IR EMBO TUMOR ORGAN ISCHEMIA INFARCT INC GUIDE ROADMAPPING MEDICATIONS: 400 mg ciprofloxacin . The antibiotic was administered within 1 hour of the procedure ANESTHESIA/SEDATION: Moderate (conscious) sedation was employed during this procedure. A total of Versed  4 mg and Fentanyl  200 mcg was administered intravenously. Moderate Sedation Time: 60 minutes. The patient's level of consciousness and vital signs were monitored continuously by radiology nursing throughout the procedure under my direct supervision. CONTRAST:  110mL OMNIPAQUE  IOHEXOL  300 MG/ML  SOLN FLUOROSCOPY: Radiation Exposure Index (as provided by the fluoroscopic device): 3550 mGy Kerma COMPLICATIONS: None immediate. PROCEDURE: Informed  consent was obtained  from the patient following explanation of the procedure, risks, benefits and alternatives. The patient understands, agrees and consents for the procedure. All questions were addressed. A time out was performed prior to the initiation of the procedure. Maximal barrier sterile technique utilized including caps, mask, sterile gowns, sterile gloves, large sterile drape, hand hygiene, and Betadine  prep. The right common femoral artery was interrogated with ultrasound and found to be widely patent. An image was obtained and stored for the medical record. Local anesthesia was attained by infiltration with 1% lidocaine . A small dermatotomy was made. Under real-time sonographic guidance, the vessel was punctured with a 21 gauge micropuncture needle. Using standard technique, the initial micro needle was exchanged over a 0.018 micro wire for a transitional 4 French micro sheath. The micro sheath was then exchanged over a 0.035 wire for a 5 French vascular sheath. A C2 cobra catheter was advanced up in over the aortic bifurcation into the left common iliac artery. An arteriogram was performed in the origins of the internal and external iliac artery were identified. The catheter was further advanced into the external iliac artery and arteriography was performed. The inferior epigastric artery and its associated aberrant operator and prostatic arteries is identified. Utilizing a Progreat out follow-up microcatheter and Fathom 14 wire, the catheter was successfully advanced into the aberrant operator artery. Additional arteriography was performed identifying the origin of the prostatic artery. Contrast injection was again performed confirming significant opacification of the prostate gland. At the distal aspect of the prostatic artery there is collateral communication with the Distal internal pudendal artery. The microcatheter was advanced distally and coil embolization was performed using a series of  penumbra detachable microcoils. Follow-up arteriography demonstrates no further filling of the internal pudendal branch. There is excellent opacification of the prostatic branches. A small amount of nitroglycerin  was administered to dilate the arterial bed. Particle embolization was then performed using 400 micron hydro pearls. Embolization was taken to near stasis. Once near stasis was achieved, the feeding prostatic artery was coil embolization using a series of detachable penumbra microcoils. Follow-up arteriography demonstrates no further opacification of the prostatic gland. The microcatheter was removed. The 5 French catheter was formed into a Waltman's loop and used to select the ipsilateral internal iliac artery. Contrast injection was performed in the origin of the prostatic artery was identified. The microcatheter was successfully advanced into the prostatic artery and arteriography was performed. Similar to the contralateral side, the distal aspect of the prostatic artery communicates with the internal pudendal artery. The microcatheter was advanced distally and coil embolization was performed. Particle embolization was then performed again utilizing 400 micron hydro pearls. Embolization was taken to near stasis. Once embolization was successful, coil embolization was performed of the prostatic artery. The catheters were removed. Hemostasis was attained with the assistance of a Celt arterial closure device. IMPRESSION: Successful repeat bilateral prostate artery embolization. Electronically Signed   By: Wilkie Lent M.D.   On: 04/05/2024 13:51    Labs:  CBC: Recent Labs    10/17/23 0834 04/05/24 0949  WBC 8.2 7.1  HGB 15.6 15.3  HCT 44.8 45.7  PLT 279 252    COAGS: Recent Labs    10/17/23 0834 04/05/24 0949  INR 1.0 0.9    BMP: Recent Labs    10/17/23 0834 04/05/24 0949  NA 139 140  K 4.3 4.1  CL 107 106  CO2 24 25  GLUCOSE 132* 122*  BUN 18 15  CALCIUM  9.2 9.6   CREATININE 1.36*  1.19  GFRNONAA 59* >60    LIVER FUNCTION TESTS: No results for input(s): BILITOT, AST, ALT, ALKPHOS, PROT, ALBUMIN in the last 8760 hours.  TUMOR MARKERS: Recent Labs    07/05/23 0958  CEA 1.38    Assessment and Plan:  63 year old gentleman doing exceptionally well 3 weeks post repeat prostate artery embolization.  He notes that after some initial discomfort following the procedure he has recovered and is now experiencing the greatest relief in symptoms that he has ever had.  He is extremely pleased with his result.  1.) No further scheduled follow-up, he will reach out if he has recurrent symptoms.   Electronically Signed: Wilkie MARLA Lent 05/02/2024, 12:10 PM   I spent a total of  15 Minutes in remote  clinical consultation, greater than 50% of which was counseling/coordinating care for BPH with LUTS.    Visit type: Audio only (telephone). Audio (no video) only due to patient's lack of internet/smartphone capability. Alternative for in-person consultation at [facility], [address], [place], [place]. This visit type was conducted due to national recommendations for restrictions regarding the COVID-19 Pandemic (e.g. social distancing).  This format is felt to be most appropriate for this patient at this time.  All issues noted in this document were discussed and addressed.

## 2024-05-09 ENCOUNTER — Other Ambulatory Visit (HOSPITAL_COMMUNITY): Payer: Self-pay

## 2024-05-20 ENCOUNTER — Other Ambulatory Visit (HOSPITAL_COMMUNITY): Payer: Self-pay

## 2024-05-20 MED ORDER — TAMSULOSIN HCL 0.4 MG PO CAPS
0.4000 mg | ORAL_CAPSULE | Freq: Every day | ORAL | 3 refills | Status: AC
  Filled 2024-05-21 – 2024-06-17 (×4): qty 90, 90d supply, fill #0

## 2024-05-21 ENCOUNTER — Other Ambulatory Visit (HOSPITAL_COMMUNITY): Payer: Self-pay

## 2024-05-31 ENCOUNTER — Other Ambulatory Visit (HOSPITAL_COMMUNITY): Payer: Self-pay

## 2024-06-03 ENCOUNTER — Other Ambulatory Visit: Payer: Self-pay

## 2024-06-03 ENCOUNTER — Other Ambulatory Visit (HOSPITAL_COMMUNITY): Payer: Self-pay

## 2024-06-04 ENCOUNTER — Other Ambulatory Visit (HOSPITAL_COMMUNITY): Payer: Self-pay

## 2024-06-14 ENCOUNTER — Other Ambulatory Visit (HOSPITAL_COMMUNITY): Payer: Self-pay

## 2024-06-17 ENCOUNTER — Other Ambulatory Visit (HOSPITAL_COMMUNITY): Payer: Self-pay

## 2024-06-17 ENCOUNTER — Other Ambulatory Visit: Payer: Self-pay

## 2024-06-18 ENCOUNTER — Other Ambulatory Visit (HOSPITAL_COMMUNITY): Payer: Self-pay

## 2024-06-18 MED ORDER — LOSARTAN POTASSIUM 100 MG PO TABS
100.0000 mg | ORAL_TABLET | Freq: Two times a day (BID) | ORAL | 3 refills | Status: AC
  Filled 2024-06-18: qty 180, 90d supply, fill #0

## 2024-07-08 ENCOUNTER — Other Ambulatory Visit: Payer: Medicaid Other

## 2024-07-08 ENCOUNTER — Ambulatory Visit: Payer: Medicaid Other | Admitting: Oncology
